# Patient Record
Sex: Female | Born: 1985 | Race: White | Hispanic: No | Marital: Married | Smoking: Current every day smoker
Health system: Southern US, Community
[De-identification: ages and names within clinical notes are randomized; demographics above are authoritative.]

## PROBLEM LIST (undated history)

## (undated) ENCOUNTER — Emergency Department (HOSPITAL_COMMUNITY): Admission: EM | Payer: Medicare Other | Source: Home / Self Care

## (undated) DIAGNOSIS — Z8742 Personal history of other diseases of the female genital tract: Secondary | ICD-10-CM

## (undated) DIAGNOSIS — E119 Type 2 diabetes mellitus without complications: Secondary | ICD-10-CM

## (undated) DIAGNOSIS — F17200 Nicotine dependence, unspecified, uncomplicated: Secondary | ICD-10-CM

## (undated) DIAGNOSIS — F319 Bipolar disorder, unspecified: Secondary | ICD-10-CM

## (undated) DIAGNOSIS — F209 Schizophrenia, unspecified: Secondary | ICD-10-CM

## (undated) DIAGNOSIS — F419 Anxiety disorder, unspecified: Secondary | ICD-10-CM

## (undated) DIAGNOSIS — R1310 Dysphagia, unspecified: Secondary | ICD-10-CM

## (undated) DIAGNOSIS — F329 Major depressive disorder, single episode, unspecified: Secondary | ICD-10-CM

## (undated) DIAGNOSIS — E669 Obesity, unspecified: Secondary | ICD-10-CM

## (undated) DIAGNOSIS — Z8619 Personal history of other infectious and parasitic diseases: Secondary | ICD-10-CM

## (undated) DIAGNOSIS — E282 Polycystic ovarian syndrome: Secondary | ICD-10-CM

## (undated) DIAGNOSIS — R0981 Nasal congestion: Secondary | ICD-10-CM

## (undated) DIAGNOSIS — R000 Tachycardia, unspecified: Secondary | ICD-10-CM

## (undated) DIAGNOSIS — L732 Hidradenitis suppurativa: Secondary | ICD-10-CM

## (undated) DIAGNOSIS — K219 Gastro-esophageal reflux disease without esophagitis: Secondary | ICD-10-CM

## (undated) DIAGNOSIS — F4481 Dissociative identity disorder: Secondary | ICD-10-CM

## (undated) DIAGNOSIS — J45909 Unspecified asthma, uncomplicated: Secondary | ICD-10-CM

## (undated) DIAGNOSIS — I10 Essential (primary) hypertension: Secondary | ICD-10-CM

## (undated) DIAGNOSIS — G4730 Sleep apnea, unspecified: Secondary | ICD-10-CM

## (undated) DIAGNOSIS — M1990 Unspecified osteoarthritis, unspecified site: Secondary | ICD-10-CM

## (undated) HISTORY — DX: Nicotine dependence, unspecified, uncomplicated: F17.200

## (undated) HISTORY — DX: Essential (primary) hypertension: I10

## (undated) HISTORY — PX: WISDOM TOOTH EXTRACTION: SHX21

## (undated) HISTORY — DX: Polycystic ovarian syndrome: E28.2

## (undated) HISTORY — DX: Major depressive disorder, single episode, unspecified: F32.9

## (undated) HISTORY — DX: Personal history of other infectious and parasitic diseases: Z86.19

## (undated) HISTORY — DX: Personal history of other diseases of the female genital tract: Z87.42

## (undated) HISTORY — DX: Obesity, unspecified: E66.9

---
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
## 2007-05-30 ENCOUNTER — Emergency Department (HOSPITAL_COMMUNITY): Admission: EM | Admit: 2007-05-30 | Discharge: 2007-05-30 | Payer: Self-pay | Admitting: Emergency Medicine

## 2007-10-19 ENCOUNTER — Emergency Department (HOSPITAL_COMMUNITY): Admission: EM | Admit: 2007-10-19 | Discharge: 2007-10-19 | Payer: Self-pay | Admitting: Emergency Medicine

## 2009-01-07 ENCOUNTER — Emergency Department (HOSPITAL_COMMUNITY): Admission: EM | Admit: 2009-01-07 | Discharge: 2009-01-07 | Payer: Self-pay | Admitting: Emergency Medicine

## 2009-08-01 ENCOUNTER — Emergency Department (HOSPITAL_COMMUNITY): Admission: EM | Admit: 2009-08-01 | Discharge: 2009-08-01 | Payer: Self-pay | Admitting: Family Medicine

## 2009-12-31 ENCOUNTER — Emergency Department (HOSPITAL_COMMUNITY): Admission: EM | Admit: 2009-12-31 | Discharge: 2009-12-31 | Payer: Self-pay | Admitting: Emergency Medicine

## 2010-05-17 ENCOUNTER — Inpatient Hospital Stay (INDEPENDENT_AMBULATORY_CARE_PROVIDER_SITE_OTHER)
Admission: RE | Admit: 2010-05-17 | Discharge: 2010-05-17 | Disposition: A | Discharge status: Home / Self Care | Payer: No Typology Code available for payment source | Source: Ambulatory Visit | Attending: Emergency Medicine | Admitting: Emergency Medicine

## 2010-05-17 DIAGNOSIS — M25519 Pain in unspecified shoulder: Secondary | ICD-10-CM

## 2010-06-15 LAB — BASIC METABOLIC PANEL
BUN: 12 mg/dL (ref 6–23)
CO2: 22 mEq/L (ref 19–32)
Calcium: 9.5 mg/dL (ref 8.4–10.5)
Chloride: 103 mEq/L (ref 96–112)
Creatinine, Ser: 0.68 mg/dL (ref 0.4–1.2)
GFR calc Af Amer: >60 mL/min (ref >60)
GFR calc non Af Amer: >60 mL/min (ref >60)
Glucose, Bld: 112 mg/dL — ABNORMAL HIGH (ref 70–99)
Potassium: 3.9 mEq/L (ref 3.5–5.1)
Sodium: 136 mEq/L (ref 135–145)

## 2010-06-15 LAB — RPR: RPR Ser Ql: NONREACTIVE

## 2010-06-15 LAB — URINALYSIS, ROUTINE W REFLEX MICROSCOPIC
Bilirubin Urine: NEGATIVE
Glucose, UA: NEGATIVE mg/dL
Hgb urine dipstick: NEGATIVE
Ketones, ur: NEGATIVE mg/dL
Nitrite: NEGATIVE
Protein, ur: NEGATIVE mg/dL
Specific Gravity, Urine: 1.015 (ref 1.005–1.030)
Urobilinogen, UA: 1 mg/dL (ref 0.0–1.0)
pH: 7 (ref 5.0–8.0)

## 2010-06-15 LAB — URINE MICROSCOPIC-ADD ON

## 2010-06-15 LAB — DIFFERENTIAL
Basophils Absolute: 0.1 10*3/uL (ref 0.0–0.1)
Basophils Relative: 1 % (ref 0–1)
Eosinophils Absolute: 0.3 10*3/uL (ref 0.0–0.7)
Eosinophils Relative: 3 % (ref 0–5)
Lymphocytes Relative: 32 % (ref 12–46)
Lymphs Abs: 3.9 10*3/uL (ref 0.7–4.0)
Monocytes Absolute: 0.5 10*3/uL (ref 0.1–1.0)
Monocytes Relative: 4 % (ref 3–12)
Neutro Abs: 7.4 10*3/uL (ref 1.7–7.7)
Neutrophils Relative %: 61 % (ref 43–77)

## 2010-06-15 LAB — CBC
HCT: 43.1 % (ref 36.0–46.0)
Hemoglobin: 15.4 g/dL — ABNORMAL HIGH (ref 12.0–15.0)
MCHC: 35.8 g/dL (ref 30.0–36.0)
MCV: 87.7 fL (ref 78.0–100.0)
Platelets: 332 10*3/uL (ref 150–400)
RBC: 4.91 MIL/uL (ref 3.87–5.11)
RDW: 12.9 % (ref 11.5–15.5)
WBC: 12.1 10*3/uL — ABNORMAL HIGH (ref 4.0–10.5)

## 2010-06-15 LAB — WET PREP, GENITAL: Yeast Wet Prep HPF POC: NONE SEEN

## 2010-06-15 LAB — GC/CHLAMYDIA PROBE AMP, GENITAL
Chlamydia, DNA Probe: NEGATIVE
GC Probe Amp, Genital: NEGATIVE

## 2010-06-15 LAB — POCT PREGNANCY, URINE: Preg Test, Ur: NEGATIVE

## 2010-07-28 NOTE — Op Note (Signed)
[place]  Patient:    Rebecca Moyer, Rebecca Moyer                         MRN: [id number] Proc. Date: 06/26/00 Adm. Date:  [id number] Attending:  Aundria Mems                           Operative Report  PREOPERATIVE DIAGNOSIS:  Hyperplastic obstructive tonsils and adenoids.  POSTOPERATIVE DIAGNOSIS:  Hyperplastic obstructive tonsils and adenoids.  OPERATION PERFORMED:  Adenotonsillectomy.  SURGEON:  Kathy Breach, M.D.  ANESTHESIA:  General orotracheal.  DESCRIPTION OF PROCEDURE:  With the patient under general orotracheal anesthesia, the Crowe-Davis mouth gag was inserted and the patient  put in a Rose position.  Inspection of the oral cavity revealed normal-appearing soft palate.  The hard palate was intact to palpation.  The tonsils were 4+ enlarged, massive in size.  They were nonpulsatile to palpation.  A red rubber catheter was passed through the left nasal chamber and used to elevate the soft palate.  Mirror visualization revealed complete obstruction of the posterior choanae by adenoid tissue.  Adenoids removed by curettage and packs were placed for hemostasis.  The left tonsil was grasped at the superior pole and removed by electrical dissection maintaining complete hemostasis with electrocautery.  The right tonsil was removed in similar fashion.  Packs were removed from the nasopharynx and under mirror visualization with suction cautery, complete ablation of the remaining adenoid tissue along the lateral gutters and Rosenmullers fossa and as it extended into to the roof of the posterior choanae was completed.  The patient had hyperplastic lymphoid tissue, enlarging both eustachian tube orifice and tori.  Estimated blood loss for this procedure was between 50 and 100 cc.  The patient tolerated the procedure well and was taken to the recovery room in stable general condition. DD:  06/26/00 TD:  06/26/00 Job: [id number]

## 2010-09-26 ENCOUNTER — Inpatient Hospital Stay (INDEPENDENT_AMBULATORY_CARE_PROVIDER_SITE_OTHER)
Admission: RE | Admit: 2010-09-26 | Discharge: 2010-09-26 | Disposition: A | Discharge status: Home / Self Care | Payer: Medicare Other | Source: Ambulatory Visit | Attending: Emergency Medicine | Admitting: Emergency Medicine

## 2010-09-26 DIAGNOSIS — L42 Pityriasis rosea: Secondary | ICD-10-CM

## 2010-10-03 ENCOUNTER — Emergency Department (HOSPITAL_COMMUNITY)
Admission: EM | Admit: 2010-10-03 | Discharge: 2010-10-04 | Disposition: A | Discharge status: Home / Self Care | Payer: Medicare Other | Attending: Emergency Medicine | Admitting: Emergency Medicine

## 2010-10-03 DIAGNOSIS — K2990 Gastroduodenitis, unspecified, without bleeding: Secondary | ICD-10-CM | POA: Insufficient documentation

## 2010-10-03 DIAGNOSIS — F319 Bipolar disorder, unspecified: Secondary | ICD-10-CM | POA: Insufficient documentation

## 2010-10-03 DIAGNOSIS — I10 Essential (primary) hypertension: Secondary | ICD-10-CM | POA: Insufficient documentation

## 2010-10-03 DIAGNOSIS — R1013 Epigastric pain: Secondary | ICD-10-CM | POA: Insufficient documentation

## 2010-10-03 DIAGNOSIS — K2970 Gastritis, unspecified, without bleeding: Secondary | ICD-10-CM | POA: Insufficient documentation

## 2010-10-03 DIAGNOSIS — R0789 Other chest pain: Secondary | ICD-10-CM | POA: Insufficient documentation

## 2010-10-03 DIAGNOSIS — J45909 Unspecified asthma, uncomplicated: Secondary | ICD-10-CM | POA: Insufficient documentation

## 2010-10-03 DIAGNOSIS — Z79899 Other long term (current) drug therapy: Secondary | ICD-10-CM | POA: Insufficient documentation

## 2010-10-03 LAB — URINALYSIS, ROUTINE W REFLEX MICROSCOPIC
Bilirubin Urine: NEGATIVE
Ketones, ur: NEGATIVE mg/dL
Leukocytes, UA: NEGATIVE
Nitrite: NEGATIVE
Protein, ur: NEGATIVE mg/dL
Specific Gravity, Urine: 1.011 (ref 1.005–1.030)
Urobilinogen, UA: 0.2 mg/dL (ref 0.0–1.0)
pH: 6.5 (ref 5.0–8.0)

## 2010-10-03 LAB — POCT PREGNANCY, URINE: Preg Test, Ur: NEGATIVE

## 2010-10-03 LAB — URINE MICROSCOPIC-ADD ON

## 2010-10-04 LAB — COMPREHENSIVE METABOLIC PANEL
ALT: 52 U/L — ABNORMAL HIGH (ref 0–35)
AST: 36 U/L (ref 0–37)
Albumin: 4.1 g/dL (ref 3.5–5.2)
Alkaline Phosphatase: 53 U/L (ref 39–117)
BUN: 7 mg/dL (ref 6–23)
Calcium: 10 mg/dL (ref 8.4–10.5)
Chloride: 100 mEq/L (ref 96–112)
GFR calc Af Amer: >60 mL/min (ref >60)
GFR calc non Af Amer: >60 mL/min (ref >60)
Potassium: 4.1 mEq/L (ref 3.5–5.1)
Sodium: 136 mEq/L (ref 135–145)
Total Protein: 7.4 g/dL (ref 6.0–8.3)

## 2010-10-04 LAB — DIFFERENTIAL
Basophils Relative: 1 % (ref 0–1)
Eosinophils Absolute: 0.3 10*3/uL (ref 0.0–0.7)
Eosinophils Relative: 3 % (ref 0–5)
Lymphs Abs: 3.2 10*3/uL (ref 0.7–4.0)
Monocytes Absolute: 0.4 10*3/uL (ref 0.1–1.0)
Monocytes Relative: 4 % (ref 3–12)
Neutro Abs: 6 10*3/uL (ref 1.7–7.7)
Neutrophils Relative %: 60 % (ref 43–77)

## 2010-10-04 LAB — CBC
HCT: 42.2 % (ref 36.0–46.0)
Hemoglobin: 15.6 g/dL — ABNORMAL HIGH (ref 12.0–15.0)
MCH: 31.1 pg (ref 26.0–34.0)
MCHC: 37 g/dL — ABNORMAL HIGH (ref 30.0–36.0)
MCV: 84.1 fL (ref 78.0–100.0)
Platelets: 288 10*3/uL (ref 150–400)
RBC: 5.02 MIL/uL (ref 3.87–5.11)
RDW: 12.6 % (ref 11.5–15.5)

## 2010-12-22 LAB — POCT RAPID STREP A: Streptococcus, Group A Screen (Direct): NEGATIVE

## 2011-06-07 ENCOUNTER — Encounter (INDEPENDENT_AMBULATORY_CARE_PROVIDER_SITE_OTHER): Payer: Medicare Other | Admitting: Obstetrics and Gynecology

## 2011-06-07 DIAGNOSIS — Z124 Encounter for screening for malignant neoplasm of cervix: Secondary | ICD-10-CM

## 2011-06-07 DIAGNOSIS — E282 Polycystic ovarian syndrome: Secondary | ICD-10-CM

## 2011-06-07 DIAGNOSIS — F32A Depression, unspecified: Secondary | ICD-10-CM

## 2011-06-07 DIAGNOSIS — N926 Irregular menstruation, unspecified: Secondary | ICD-10-CM

## 2011-06-07 DIAGNOSIS — E669 Obesity, unspecified: Secondary | ICD-10-CM

## 2011-06-07 DIAGNOSIS — F17200 Nicotine dependence, unspecified, uncomplicated: Secondary | ICD-10-CM

## 2011-06-07 DIAGNOSIS — Z202 Contact with and (suspected) exposure to infections with a predominantly sexual mode of transmission: Secondary | ICD-10-CM

## 2011-06-07 DIAGNOSIS — I10 Essential (primary) hypertension: Secondary | ICD-10-CM

## 2011-06-07 HISTORY — DX: Depression, unspecified: F32.A

## 2011-06-07 HISTORY — PX: TONSILLECTOMY: SUR1361

## 2011-06-07 HISTORY — DX: Essential (primary) hypertension: I10

## 2011-06-07 HISTORY — DX: Obesity, unspecified: E66.9

## 2011-06-07 HISTORY — PX: OTHER SURGICAL HISTORY: SHX169

## 2011-06-07 HISTORY — DX: Nicotine dependence, unspecified, uncomplicated: F17.200

## 2011-06-07 HISTORY — PX: WISDOM TOOTH EXTRACTION: SHX21

## 2011-06-07 HISTORY — DX: Polycystic ovarian syndrome: E28.2

## 2011-06-19 ENCOUNTER — Other Ambulatory Visit: Payer: Self-pay | Admitting: Obstetrics and Gynecology

## 2011-06-19 ENCOUNTER — Ambulatory Visit (INDEPENDENT_AMBULATORY_CARE_PROVIDER_SITE_OTHER): Payer: Medicare Other | Admitting: Obstetrics and Gynecology

## 2011-06-19 ENCOUNTER — Encounter: Payer: Self-pay | Admitting: Obstetrics and Gynecology

## 2011-06-19 VITALS — BP 140/74 | Resp 16 | Ht 70.0 in | Wt 288.0 lb

## 2011-06-19 DIAGNOSIS — D280 Benign neoplasm of vulva: Secondary | ICD-10-CM

## 2011-06-19 NOTE — Progress Notes (Signed)
Patient ID: Rebecca Moyer, female   DOB: 1985/10/13, 26 y.o.   MRN: [id number]  Chief Complaint  Patient presents with  . Follow-up    f/u from 06-07-11 Visit    HPI Rebecca Moyer is a 26 y.o. female.  She has a lesion on her vulva that may be a condylomata or a skin tag. It irritates her. 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 No pertinent past medical history.  History reviewed. No pertinent past surgical history.  History reviewed. No pertinent family history.  Social History History  Substance Use Topics  . Smoking status: Current Everyday Smoker -- 0.5 packs/day for 3 years    Types: Cigarettes  . Smokeless tobacco: Not on file   Comment: pt states she is try to quite as of now  . Alcohol Use: Not on file    No Known Allergies  Current Outpatient Prescriptions  Medication Sig Dispense Refill  . ALPRAZolam (XANAX PO) Take by mouth.      Marland Kitchen atenolol (TENORMIN) 100 MG tablet Take by mouth daily.      Marland Kitchen HYDROCODONE-ACETAMINOPHEN PO Take by mouth.        Review of Systems Review of Systems  Blood pressure 140/74, resp. rate 16, height 5\' 10"  (1.778 m), weight 288 lb (130.636 kg), last menstrual period 06/19/2011.  Physical Exam Physical Exam  Data Reviewed Pap WNL, GC neg, Chl neg  Assessment    Prepping with Betadine Local anesthesia with 1% Buffered Lidocaine Stitch of 4-0 Vicryl placed at the base of the lesion. Lesion (0.5 cm) sharply excised. Tolerated well. Silver Nitrate applied:  No  Well tolerated  Specimen appropriately identified and sent to pathology    Plan    Follow-up:  3 weeks      Newel Oien V 06/19/2011, 9:06 PM

## 2011-06-24 ENCOUNTER — Ambulatory Visit (HOSPITAL_BASED_OUTPATIENT_CLINIC_OR_DEPARTMENT_OTHER): Payer: Medicaid Other

## 2011-07-10 ENCOUNTER — Encounter: Payer: Medicaid Other | Admitting: Obstetrics and Gynecology

## 2011-07-11 ENCOUNTER — Telehealth: Payer: Self-pay | Admitting: Obstetrics and Gynecology

## 2011-08-27 ENCOUNTER — Telehealth: Payer: Self-pay | Admitting: Obstetrics and Gynecology

## 2011-08-27 NOTE — Telephone Encounter (Signed)
Triage/cht received 

## 2011-08-27 NOTE — Telephone Encounter (Signed)
Tried to return pt call at [phone] but phone number did not work.

## 2011-09-08 ENCOUNTER — Encounter (HOSPITAL_COMMUNITY): Payer: Self-pay | Admitting: *Deleted

## 2011-09-08 ENCOUNTER — Emergency Department (HOSPITAL_COMMUNITY): Payer: Medicare Other

## 2011-09-08 ENCOUNTER — Emergency Department (HOSPITAL_COMMUNITY)
Admission: EM | Admit: 2011-09-08 | Discharge: 2011-09-08 | Disposition: A | Discharge status: Home / Self Care | Payer: Medicare Other | Attending: Emergency Medicine | Admitting: Emergency Medicine

## 2011-09-08 DIAGNOSIS — J45909 Unspecified asthma, uncomplicated: Secondary | ICD-10-CM | POA: Insufficient documentation

## 2011-09-08 DIAGNOSIS — Z79899 Other long term (current) drug therapy: Secondary | ICD-10-CM | POA: Insufficient documentation

## 2011-09-08 DIAGNOSIS — M25562 Pain in left knee: Secondary | ICD-10-CM

## 2011-09-08 DIAGNOSIS — I10 Essential (primary) hypertension: Secondary | ICD-10-CM | POA: Insufficient documentation

## 2011-09-08 DIAGNOSIS — E669 Obesity, unspecified: Secondary | ICD-10-CM | POA: Insufficient documentation

## 2011-09-08 DIAGNOSIS — F17200 Nicotine dependence, unspecified, uncomplicated: Secondary | ICD-10-CM | POA: Insufficient documentation

## 2011-09-08 DIAGNOSIS — G8929 Other chronic pain: Secondary | ICD-10-CM | POA: Insufficient documentation

## 2011-09-08 DIAGNOSIS — M25569 Pain in unspecified knee: Secondary | ICD-10-CM | POA: Insufficient documentation

## 2011-09-08 MED ORDER — OXYCODONE-ACETAMINOPHEN 5-325 MG PO TABS
2.0000 | ORAL_TABLET | Freq: Once | ORAL | Status: AC
  Administered 2011-09-08: 2 via ORAL
  Filled 2011-09-08: qty 2

## 2011-09-08 MED ORDER — OXYCODONE-ACETAMINOPHEN 5-325 MG PO TABS: 1.0000 | ORAL_TABLET | Freq: Four times a day (QID) | ORAL | Status: AC | PRN

## 2011-09-08 NOTE — ED Provider Notes (Signed)
Medical screening examination/treatment/procedure(s) were performed by non-physician practitioner and as supervising physician I was immediately available for consultation/collaboration.   Zacaria Pousson, MD 09/08/11 0706 

## 2011-09-08 NOTE — ED Provider Notes (Signed)
History     CSN: [id number]  Arrival date & time 09/08/11  Rich Fuchs   First MD Initiated Contact with Patient 09/08/11 0141      Chief Complaint  Patient presents with  . Knee Pain    L    (Consider location/radiation/quality/duration/timing/severity/associated sxs/prior treatment) HPI Comments: Patient with a history of chronic knee pain, treated by Dr. Mayford Knife with Vicodin.  No states, that her left knee.  Pain is worse, and she can barely stand to walk for the past 3, days  Patient is a 26 y.o. female presenting with knee pain.  Knee Pain This is a chronic problem. The current episode started more than 1 year ago. The problem has been gradually worsening. Associated symptoms include joint swelling. Pertinent negatives include no chills, fever, rash or weakness.    Past Medical History  Diagnosis Date  . Asthma 06/07/11  . H/O varicella   . H/O amenorrhea 01/27/04  . Irregular periods/menstrual cycles 05/22/04  . Depression 06/07/11  . Hypertension 06/07/11  . PCOS (polycystic ovarian syndrome) 06/07/11  . Obesity 06/07/11  . Smoker 06/07/11    Past Surgical History  Procedure Date  . Tonsillectomy 06/07/11  . Wisdom tooth extraction 06/07/11  . Adnoids 06/07/11    No family history on file.  History  Substance Use Topics  . Smoking status: Current Everyday Smoker -- 0.5 packs/day for 3 years    Types: Cigarettes  . Smokeless tobacco: Not on file   Comment: pt states she is try to quite as of now  . Alcohol Use: No    OB History    Grav Para Term Preterm Abortions TAB SAB Ect Mult Living                  Review of Systems  Constitutional: Negative for fever and chills.  Musculoskeletal: Positive for joint swelling.  Skin: Negative for rash and wound.  Neurological: Negative for dizziness and weakness.    Allergies  Triamterene  Home Medications   Current Outpatient Rx  Name Route Sig Dispense Refill  . ALBUTEROL SULFATE HFA 108 (90 BASE) MCG/ACT IN AERS  Inhalation Inhale 2 puffs into the lungs every 6 (six) hours as needed. Wheezing    . ALBUTEROL SULFATE (2.5 MG/3ML) 0.083% IN NEBU Nebulization Take 2.5 mg by nebulization every 8 (eight) hours as needed. Shortness of breath    . ALPRAZOLAM 1 MG PO TABS Oral Take 1 mg by mouth 3 (three) times daily. Scheduled doses for anxiety    . ATENOLOL-CHLORTHALIDONE 50-25 MG PO TABS Oral Take 1 tablet by mouth daily.    Marland Kitchen DILTIAZEM HCL 120 MG PO TABS Oral Take 120 mg by mouth daily.    Marland Kitchen FLUTICASONE-SALMETEROL 500-50 MCG/DOSE IN AEPB Inhalation Inhale 1 puff into the lungs every 12 (twelve) hours.    Marland Kitchen HYDROCODONE-ACETAMINOPHEN 7.5-325 MG PO TABS Oral Take 1 tablet by mouth 2 (two) times daily as needed. pain    . NORETHINDRONE 0.35 MG PO TABS Oral Take 1 tablet by mouth daily.    . OXYCODONE-ACETAMINOPHEN 5-325 MG PO TABS Oral Take 1 tablet by mouth every 6 (six) hours as needed for pain. 20 tablet 0    BP 129/87  Pulse 87  Temp 97.3 F (36.3 C) (Oral)  Resp 18  SpO2 96%  LMP 06/08/2011  Physical Exam  Constitutional: She appears well-developed and well-nourished.       Morbidly obese  HENT:  Head: Normocephalic.  Eyes: Pupils are equal, round,  and reactive to light.  Neck: Normal range of motion.  Cardiovascular: Normal rate.   Pulmonary/Chest: Effort normal.  Musculoskeletal: Normal range of motion.       Legs: Neurological: She is alert.  Skin: Skin is warm.    ED Course  Procedures (including critical care time)  Labs Reviewed - No data to display Dg Knee Complete 4 Views Left  09/08/2011  *RADIOLOGY REPORT*  Clinical Data: Knee pain, left side.  LEFT KNEE - COMPLETE 4+ VIEW  Comparison: 10/18/2008  Findings: No fracture, dislocation, or aggressive osseous lesion. No significant joint effusion.  IMPRESSION: No acute osseous abnormality identified.  Original Report Authenticated By: Waneta Martins, M.D.     1. Knee pain, left       MDM   Exacerbation of chronic knee  pain, not controlled with 7.5 mg, Norco        Arman Filter, NP 09/08/11 1610

## 2011-09-08 NOTE — Discharge Instructions (Signed)
Knee Pain The knee is the complex joint between your thigh and your lower leg. It is made up of bones, tendons, ligaments, and cartilage. The bones that make up the knee are:  The femur in the thigh.   The tibia and fibula in the lower leg.   The patella or kneecap riding in the groove on the lower femur.  CAUSES  Knee pain is a common complaint with many causes. A few of these causes are:  Injury, such as:   A ruptured ligament or tendon injury.   Torn cartilage.   Medical conditions, such as:   Gout   Arthritis   Infections   Overuse, over training or overdoing a physical activity.  Knee pain can be minor or severe. Knee pain can accompany debilitating injury. Minor knee problems often respond well to self-care measures or get well on their own. More serious injuries may need medical intervention or even surgery. SYMPTOMS The knee is complex. Symptoms of knee problems can vary widely. Some of the problems are:  Pain with movement and weight bearing.   Swelling and tenderness.   Buckling of the knee.   Inability to straighten or extend your knee.   Your knee locks and you cannot straighten it.   Warmth and redness with pain and fever.   Deformity or dislocation of the kneecap.  DIAGNOSIS  Determining what is wrong may be very straight forward such as when there is an injury. It can also be challenging because of the complexity of the knee. Tests to make a diagnosis may include:  Your caregiver taking a history and doing a physical exam.   Routine X-rays can be used to rule out other problems. X-rays will not reveal a cartilage tear. Some injuries of the knee can be diagnosed by:   Arthroscopy a surgical technique by which a small video camera is inserted through tiny incisions on the sides of the knee. This procedure is used to examine and repair internal knee joint problems. Tiny instruments can be used during arthroscopy to repair the torn knee cartilage  (meniscus).   Arthrography is a radiology technique. A contrast liquid is directly injected into the knee joint. Internal structures of the knee joint then become visible on X-ray film.   An MRI scan is a non x-ray radiology procedure in which magnetic fields and a computer produce two- or three-dimensional images of the inside of the knee. Cartilage tears are often visible using an MRI scanner. MRI scans have largely replaced arthrography in diagnosing cartilage tears of the knee.   Blood work.   Examination of the fluid that helps to lubricate the knee joint (synovial fluid). This is done by taking a sample out using a needle and a syringe.  TREATMENT The treatment of knee problems depends on the cause. Some of these treatments are:  Depending on the injury, proper casting, splinting, surgery or physical therapy care will be needed.   Give yourself adequate recovery time. Do not overuse your joints. If you begin to get sore during workout routines, back off. Slow down or do fewer repetitions.   For repetitive activities such as cycling or running, maintain your strength and nutrition.   Alternate muscle groups. For example if you are a weight lifter, work the upper body on one day and the lower body the next.   Either tight or weak muscles do not give the proper support for your knee. Tight or weak muscles do not absorb the stress placed   on the knee joint. Keep the muscles surrounding the knee strong.   Take care of mechanical problems.   If you have flat feet, orthotics or special shoes may help. See your caregiver if you need help.   Arch supports, sometimes with wedges on the inner or outer aspect of the heel, can help. These can shift pressure away from the side of the knee most bothered by osteoarthritis.   A brace called an "unloader" brace also may be used to help ease the pressure on the most arthritic side of the knee.   If your caregiver has prescribed crutches, braces,  wraps or ice, use as directed. The acronym for this is PRICE. This means protection, rest, ice, compression and elevation.   Nonsteroidal anti-inflammatory drugs (NSAID's), can help relieve pain. But if taken immediately after an injury, they may actually increase swelling. Take NSAID's with food in your stomach. Stop them if you develop stomach problems. Do not take these if you have a history of ulcers, stomach pain or bleeding from the bowel. Do not take without your caregiver's approval if you have problems with fluid retention, heart failure, or kidney problems.   For ongoing knee problems, physical therapy may be helpful.   Glucosamine and chondroitin are over-the-counter dietary supplements. Both may help relieve the pain of osteoarthritis in the knee. These medicines are different from the usual anti-inflammatory drugs. Glucosamine may decrease the rate of cartilage destruction.   Injections of a corticosteroid drug into your knee joint may help reduce the symptoms of an arthritis flare-up. They may provide pain relief that lasts a few months. You may have to wait a few months between injections. The injections do have a small increased risk of infection, water retention and elevated blood sugar levels.   Hyaluronic acid injected into damaged joints may ease pain and provide lubrication. These injections may work by reducing inflammation. A series of shots may give relief for as long as 6 months.   Topical painkillers. Applying certain ointments to your skin may help relieve the pain and stiffness of osteoarthritis. Ask your pharmacist for suggestions. Many over the-counter products are approved for temporary relief of arthritis pain.   In some countries, doctors often prescribe topical NSAID's for relief of chronic conditions such as arthritis and tendinitis. A review of treatment with NSAID creams found that they worked as well as oral medications but without the serious side effects.    PREVENTION  Maintain a healthy weight. Extra pounds put more strain on your joints.   Get strong, stay limber. Weak muscles are a common cause of knee injuries. Stretching is important. Include flexibility exercises in your workouts.   Be smart about exercise. If you have osteoarthritis, chronic knee pain or recurring injuries, you may need to change the way you exercise. This does not mean you have to stop being active. If your knees ache after jogging or playing basketball, consider switching to swimming, water aerobics or other low-impact activities, at least for a few days a week. Sometimes limiting high-impact activities will provide relief.   Make sure your shoes fit well. Choose footwear that is right for your sport.   Protect your knees. Use the proper gear for knee-sensitive activities. Use kneepads when playing volleyball or laying carpet. Buckle your seat belt every time you drive. Most shattered kneecaps occur in car accidents.   Rest when you are tired.  SEEK MEDICAL CARE IF:  You have knee pain that is continual and does not   seem to be getting better.  SEEK IMMEDIATE MEDICAL CARE IF:  Your knee joint feels hot to the touch and you have a high fever. MAKE SURE YOU:   Understand these instructions.   Will watch your condition.   Will get help right away if you are not doing well or get worse.  Document Released: 12/24/2006 Document Revised: 02/15/2011 Document Reviewed: 12/24/2006 [facility] Patient Information 2012 [place], [place]. As make an appointment with Dr. Luiz Blare for further evaluation of her chronic knee pain

## 2011-09-08 NOTE — ED Notes (Signed)
Ortho tech here and left knee sleeve applied

## 2011-09-08 NOTE — ED Notes (Signed)
C/o chronic knee problems, this episode onset Thursday night, c/o sharp L knee pain, worse with movement, walking and standing, desribes "noisy and popping".

## 2011-09-24 ENCOUNTER — Encounter: Payer: Medicaid Other | Admitting: Obstetrics and Gynecology

## 2011-11-01 ENCOUNTER — Emergency Department (HOSPITAL_COMMUNITY)
Admission: EM | Admit: 2011-11-01 | Discharge: 2011-11-01 | Disposition: A | Discharge status: Home / Self Care | Payer: Medicare Other | Attending: Emergency Medicine | Admitting: Emergency Medicine

## 2011-11-01 ENCOUNTER — Encounter (HOSPITAL_COMMUNITY): Payer: Self-pay | Admitting: Emergency Medicine

## 2011-11-01 DIAGNOSIS — M545 Low back pain: Secondary | ICD-10-CM

## 2011-11-01 DIAGNOSIS — I10 Essential (primary) hypertension: Secondary | ICD-10-CM | POA: Insufficient documentation

## 2011-11-01 DIAGNOSIS — E669 Obesity, unspecified: Secondary | ICD-10-CM | POA: Insufficient documentation

## 2011-11-01 DIAGNOSIS — M549 Dorsalgia, unspecified: Secondary | ICD-10-CM | POA: Insufficient documentation

## 2011-11-01 DIAGNOSIS — F17200 Nicotine dependence, unspecified, uncomplicated: Secondary | ICD-10-CM | POA: Insufficient documentation

## 2011-11-01 DIAGNOSIS — G8929 Other chronic pain: Secondary | ICD-10-CM | POA: Insufficient documentation

## 2011-11-01 DIAGNOSIS — J45909 Unspecified asthma, uncomplicated: Secondary | ICD-10-CM | POA: Insufficient documentation

## 2011-11-01 LAB — URINALYSIS, ROUTINE W REFLEX MICROSCOPIC
Bilirubin Urine: NEGATIVE
Glucose, UA: 250 mg/dL — AB
Ketones, ur: 15 mg/dL — AB
Nitrite: NEGATIVE
Protein, ur: NEGATIVE mg/dL
Specific Gravity, Urine: >1.03 — ABNORMAL HIGH (ref 1.005–1.030)
Urobilinogen, UA: 0.2 mg/dL (ref 0.0–1.0)
pH: 6 (ref 5.0–8.0)

## 2011-11-01 LAB — URINE MICROSCOPIC-ADD ON

## 2011-11-01 MED ORDER — HYDROCODONE-ACETAMINOPHEN 5-325 MG PO TABS: 1.0000 | ORAL_TABLET | Freq: Four times a day (QID) | ORAL | Status: AC | PRN

## 2011-11-01 MED ORDER — OXYCODONE-ACETAMINOPHEN 5-325 MG PO TABS
2.0000 | ORAL_TABLET | Freq: Once | ORAL | Status: AC
  Administered 2011-11-01: 2 via ORAL
  Filled 2011-11-01: qty 2

## 2011-11-01 MED ORDER — KETOROLAC TROMETHAMINE 60 MG/2ML IM SOLN: 60.0000 mg | Freq: Once | INTRAMUSCULAR | Status: DC

## 2011-11-01 MED ORDER — CYCLOBENZAPRINE HCL 10 MG PO TABS: 10.0000 mg | ORAL_TABLET | Freq: Three times a day (TID) | ORAL | Status: AC | PRN

## 2011-11-01 NOTE — ED Notes (Signed)
Pt for discharge.Vital signs stable and GCS 15 

## 2011-11-01 NOTE — ED Provider Notes (Signed)
History     CSN: [id number]  Arrival date & time 11/01/11  0520   First MD Initiated Contact with Patient 11/01/11 [phone]      Chief Complaint  Patient presents with  . Back Pain    (Consider location/radiation/quality/duration/timing/severity/associated sxs/prior treatment) HPI Patient is here for chronic back pain. The patient states that her pain got worse 3 days ago. She also states that she is dizzy. The patient denies numbness, weakness, nausea, vomiting, chest pain, SOB, headache, fever, syncope, or blurred vision.The patient states that she tried ibuprofen. The patient denies any relief. The patient states that she only gets relief with narcotic pain medications. The patein Past Medical History  Diagnosis Date  . Asthma 06/07/11  . H/O varicella   . H/O amenorrhea 01/27/04  . Irregular periods/menstrual cycles 05/22/04  . Depression 06/07/11  . Hypertension 06/07/11  . PCOS (polycystic ovarian syndrome) 06/07/11  . Obesity 06/07/11  . Smoker 06/07/11    Past Surgical History  Procedure Date  . Tonsillectomy 06/07/11  . Wisdom tooth extraction 06/07/11  . Adnoids 06/07/11    No family history on file.  History  Substance Use Topics  . Smoking status: Current Everyday Smoker -- 0.5 packs/day for 3 years    Types: Cigarettes  . Smokeless tobacco: Not on file   Comment: pt states she is try to quite as of now  . Alcohol Use: No    OB History    Grav Para Term Preterm Abortions TAB SAB Ect Mult Living                  Review of Systems All other systems negative except as documented in the HPI. All pertinent positives and negatives as reviewed in the HPI.  Allergies  Triamterene  Home Medications   Current Outpatient Rx  Name Route Sig Dispense Refill  . ALBUTEROL SULFATE HFA 108 (90 BASE) MCG/ACT IN AERS Inhalation Inhale 2 puffs into the lungs every 6 (six) hours as needed. Wheezing    . ALBUTEROL SULFATE (2.5 MG/3ML) 0.083% IN NEBU Nebulization Take 2.5 mg  by nebulization every 8 (eight) hours as needed. Shortness of breath    . ALPRAZOLAM 1 MG PO TABS Oral Take 1 mg by mouth 3 (three) times daily. Scheduled doses for anxiety    . ATENOLOL-CHLORTHALIDONE 50-25 MG PO TABS Oral Take 1 tablet by mouth daily.    Marland Kitchen DILTIAZEM HCL 120 MG PO TABS Oral Take 120 mg by mouth daily.    Marland Kitchen FLUTICASONE-SALMETEROL 500-50 MCG/DOSE IN AEPB Inhalation Inhale 1 puff into the lungs every 12 (twelve) hours.    Marland Kitchen HYDROCODONE-ACETAMINOPHEN 7.5-325 MG PO TABS Oral Take 1 tablet by mouth 2 (two) times daily as needed. pain    . IBUPROFEN 200 MG PO TABS Oral Take 400 mg by mouth every 6 (six) hours as needed.      BP 112/56  Pulse 102  Temp 97.1 F (36.2 C) (Oral)  Resp 23  SpO2 96%  Physical Exam  Constitutional: She is oriented to person, place, and time. She appears well-developed and well-nourished. No distress.  HENT:  Head: Normocephalic and atraumatic.  Mouth/Throat: Oropharynx is clear and moist.  Cardiovascular: Normal rate, regular rhythm and normal heart sounds.  Exam reveals no gallop and no friction rub.   No murmur heard. Pulmonary/Chest: Effort normal and breath sounds normal.  Musculoskeletal:       Left shoulder: She exhibits tenderness and pain. She exhibits normal range of motion, no  bony tenderness, no deformity and no spasm.       Cervical back: She exhibits tenderness, pain and spasm. She exhibits normal range of motion and no bony tenderness.       Thoracic back: Normal.       Lumbar back: Normal.       Back:       Arms: Neurological: She is alert and oriented to person, place, and time. She exhibits normal muscle tone. Coordination normal.  Skin: Skin is warm and dry. No rash noted.    ED Course  Procedures (including critical care time)   Labs Reviewed  POCT PREGNANCY, URINE  URINALYSIS, ROUTINE W REFLEX MICROSCOPIC    Patient has no neurological deficits noted on exam. Patient referred to her PCP as well. Told to follow up  with ortho as well. Told to use ice and heat. There is no new qualities to her chronic pain.  MDM  MDM Reviewed: previous chart, nursing note and vitals Interpretation: labs and x-ray            Carlyle Dolly, PA-C 11/08/11 1108

## 2011-11-01 NOTE — ED Notes (Signed)
Pt sitting up in bed.Complains of chronic back pain.She refused for blood works and wants to go home with pain medications and her routine meds.

## 2011-11-01 NOTE — ED Notes (Addendum)
Pt ambulated with a  Steady gait and no distress to provide urine sample.

## 2011-11-01 NOTE — ED Notes (Signed)
PT. REPORTS LOW BACK PAIN /LEFT SHOULDER PAIN WITH DIZZINESS ONSET YESTERDAY . PT. STATES SHE LOST HER BALANCE AND FELL YESTERDAY WHILE HELPING SOMEBODY MOVE  A FURNITURE.

## 2011-11-10 NOTE — ED Provider Notes (Signed)
Medical screening examination/treatment/procedure(s) were performed by non-physician practitioner and as supervising physician I was immediately available for consultation/collaboration.  Mehak Roskelley R. Jaimeson Gopal, MD 11/10/11 0658 

## 2012-12-28 ENCOUNTER — Emergency Department (HOSPITAL_COMMUNITY): Payer: Medicare Other

## 2012-12-28 ENCOUNTER — Encounter (HOSPITAL_COMMUNITY): Payer: Self-pay | Admitting: Emergency Medicine

## 2012-12-28 ENCOUNTER — Emergency Department (HOSPITAL_COMMUNITY)
Admission: EM | Admit: 2012-12-28 | Discharge: 2012-12-28 | Disposition: A | Discharge status: Home / Self Care | Payer: Medicare Other | Attending: Emergency Medicine | Admitting: Emergency Medicine

## 2012-12-28 DIAGNOSIS — S0990XA Unspecified injury of head, initial encounter: Secondary | ICD-10-CM | POA: Insufficient documentation

## 2012-12-28 DIAGNOSIS — Y9389 Activity, other specified: Secondary | ICD-10-CM | POA: Insufficient documentation

## 2012-12-28 DIAGNOSIS — IMO0002 Reserved for concepts with insufficient information to code with codable children: Secondary | ICD-10-CM | POA: Insufficient documentation

## 2012-12-28 DIAGNOSIS — Y92410 Unspecified street and highway as the place of occurrence of the external cause: Secondary | ICD-10-CM | POA: Insufficient documentation

## 2012-12-28 DIAGNOSIS — S4980XA Other specified injuries of shoulder and upper arm, unspecified arm, initial encounter: Secondary | ICD-10-CM | POA: Insufficient documentation

## 2012-12-28 DIAGNOSIS — S298XXA Other specified injuries of thorax, initial encounter: Secondary | ICD-10-CM | POA: Insufficient documentation

## 2012-12-28 DIAGNOSIS — F3289 Other specified depressive episodes: Secondary | ICD-10-CM | POA: Insufficient documentation

## 2012-12-28 DIAGNOSIS — Z79899 Other long term (current) drug therapy: Secondary | ICD-10-CM | POA: Insufficient documentation

## 2012-12-28 DIAGNOSIS — S0993XA Unspecified injury of face, initial encounter: Secondary | ICD-10-CM | POA: Insufficient documentation

## 2012-12-28 DIAGNOSIS — Z8619 Personal history of other infectious and parasitic diseases: Secondary | ICD-10-CM | POA: Insufficient documentation

## 2012-12-28 DIAGNOSIS — Z8639 Personal history of other endocrine, nutritional and metabolic disease: Secondary | ICD-10-CM | POA: Insufficient documentation

## 2012-12-28 DIAGNOSIS — E669 Obesity, unspecified: Secondary | ICD-10-CM | POA: Insufficient documentation

## 2012-12-28 DIAGNOSIS — Z862 Personal history of diseases of the blood and blood-forming organs and certain disorders involving the immune mechanism: Secondary | ICD-10-CM | POA: Insufficient documentation

## 2012-12-28 DIAGNOSIS — Z8742 Personal history of other diseases of the female genital tract: Secondary | ICD-10-CM | POA: Insufficient documentation

## 2012-12-28 DIAGNOSIS — S46909A Unspecified injury of unspecified muscle, fascia and tendon at shoulder and upper arm level, unspecified arm, initial encounter: Secondary | ICD-10-CM | POA: Insufficient documentation

## 2012-12-28 DIAGNOSIS — F329 Major depressive disorder, single episode, unspecified: Secondary | ICD-10-CM | POA: Insufficient documentation

## 2012-12-28 DIAGNOSIS — I10 Essential (primary) hypertension: Secondary | ICD-10-CM | POA: Insufficient documentation

## 2012-12-28 DIAGNOSIS — J45909 Unspecified asthma, uncomplicated: Secondary | ICD-10-CM | POA: Insufficient documentation

## 2012-12-28 DIAGNOSIS — F17200 Nicotine dependence, unspecified, uncomplicated: Secondary | ICD-10-CM | POA: Insufficient documentation

## 2012-12-28 MED ORDER — METHOCARBAMOL 500 MG PO TABS: 500.0000 mg | ORAL_TABLET | Freq: Two times a day (BID) | ORAL | Status: DC

## 2012-12-28 MED ORDER — HYDROCODONE-ACETAMINOPHEN 5-325 MG PO TABS
2.0000 | ORAL_TABLET | Freq: Once | ORAL | Status: AC
  Administered 2012-12-28: 2 via ORAL
  Filled 2012-12-28: qty 2

## 2012-12-28 MED ORDER — HYDROCODONE-ACETAMINOPHEN 5-325 MG PO TABS: 2.0000 | ORAL_TABLET | Freq: Four times a day (QID) | ORAL | Status: DC | PRN

## 2012-12-28 MED ORDER — NAPROXEN 500 MG PO TABS: 500.0000 mg | ORAL_TABLET | Freq: Two times a day (BID) | ORAL | Status: DC
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
## 2012-12-28 NOTE — ED Notes (Signed)
Patient going upstairs to 6N to visit fiance

## 2012-12-28 NOTE — ED Notes (Signed)
Patient transported to X-ray 
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
## 2013-01-01 NOTE — ED Provider Notes (Signed)
Medical screening examination/treatment/procedure(s) were performed by non-physician practitioner and as supervising physician I was immediately available for consultation/collaboration.  EKG Interpretation   None         Rolan Bucco, MD 01/01/13 0700

## 2013-03-28 ENCOUNTER — Encounter (HOSPITAL_COMMUNITY): Payer: Self-pay | Admitting: Emergency Medicine

## 2013-03-28 ENCOUNTER — Emergency Department (INDEPENDENT_AMBULATORY_CARE_PROVIDER_SITE_OTHER)
Admission: EM | Admit: 2013-03-28 | Discharge: 2013-03-28 | Disposition: A | Discharge status: Home / Self Care | Payer: Medicare Other | Source: Home / Self Care | Attending: Emergency Medicine | Admitting: Emergency Medicine

## 2013-03-28 DIAGNOSIS — H7290 Unspecified perforation of tympanic membrane, unspecified ear: Secondary | ICD-10-CM

## 2013-03-28 DIAGNOSIS — H6690 Otitis media, unspecified, unspecified ear: Secondary | ICD-10-CM

## 2013-03-28 MED ORDER — AMOXICILLIN 500 MG PO CAPS: 500.0000 mg | ORAL_CAPSULE | Freq: Three times a day (TID) | ORAL | Status: DC

## 2013-03-28 NOTE — ED Provider Notes (Signed)
CSN: [id number]     Arrival date & time 03/28/13  1649 History   First MD Initiated Contact with Patient 03/28/13 1901     Chief Complaint  Patient presents with  . Otalgia  . Cough   (Consider location/radiation/quality/duration/timing/severity/associated sxs/prior Treatment) HPI Comments: Approximately one week of mild URI sx with 5 days of left ear pain. No drainage from ear canal but does endorse diminished hearing on left side.   Patient is a 28 y.o. female presenting with ear pain and cough. The history is provided by the patient.  Otalgia Location:  Left Quality:  Aching Severity:  Moderate Onset quality:  Gradual Duration:  5 days Timing:  Constant Chronicity:  New Associated symptoms: congestion, cough and fever   Cough Associated symptoms: ear pain and fever     Past Medical History  Diagnosis Date  . Asthma 06/07/11  . H/O varicella   . H/O amenorrhea 01/27/04  . Irregular periods/menstrual cycles 05/22/04  . Depression 06/07/11  . Hypertension 06/07/11  . PCOS (polycystic ovarian syndrome) 06/07/11  . Obesity 06/07/11  . Smoker 06/07/11   Past Surgical History  Procedure Laterality Date  . Tonsillectomy  06/07/11  . Wisdom tooth extraction  06/07/11  . Adnoids  06/07/11   No family history on file. History  Substance Use Topics  . Smoking status: Current Every Day Smoker -- 0.50 packs/day    Types: Cigarettes  . Smokeless tobacco: Not on file     Comment: pt states she is try to quite as of now  . Alcohol Use: No   OB History   Grav Para Term Preterm Abortions TAB SAB Ect Mult Living                 Review of Systems  Constitutional: Positive for fever.  HENT: Positive for congestion and ear pain.   Respiratory: Positive for cough.   All other systems reviewed and are negative.    Allergies  Triamterene  Home Medications   Current Outpatient Rx  Name  Route  Sig  Dispense  Refill  . albuterol (PROVENTIL HFA;VENTOLIN HFA) 108 (90 BASE) MCG/ACT  inhaler   Inhalation   Inhale 2 puffs into the lungs every 6 (six) hours as needed. Wheezing         . albuterol (PROVENTIL) (2.5 MG/3ML) 0.083% nebulizer solution   Nebulization   Take 2.5 mg by nebulization every 8 (eight) hours as needed. Shortness of breath         . ALPRAZolam (XANAX) 1 MG tablet   Oral   Take 1 mg by mouth 3 (three) times daily. Scheduled doses for anxiety         . ATENOLOL PO   Oral   Take by mouth.         . diltiazem (CARDIZEM) 120 MG tablet   Oral   Take 120 mg by mouth daily.         Marland Kitchen GLIPIZIDE PO   Oral   Take by mouth.         Marland Kitchen HYDROcodone-acetaminophen (NORCO) 10-325 MG per tablet   Oral   Take 1 tablet by mouth every 6 (six) hours as needed.         . Liraglutide (VICTOZA) 18 MG/3ML SOPN   Subcutaneous   Inject 0.6 Units into the skin daily.         . methocarbamol (ROBAXIN) 500 MG tablet   Oral   Take 1 tablet (500 mg total) by  mouth 2 (two) times daily.   20 tablet   0   . atenolol-chlorthalidone (TENORETIC) 50-25 MG per tablet   Oral   Take 1 tablet by mouth daily.         . Fluticasone-Salmeterol (ADVAIR) 500-50 MCG/DOSE AEPB   Inhalation   Inhale 1 puff into the lungs every 12 (twelve) hours.         Marland Kitchen HYDROcodone-acetaminophen (NORCO) 7.5-325 MG per tablet   Oral   Take 1 tablet by mouth 2 (two) times daily as needed. pain         . HYDROcodone-acetaminophen (NORCO/VICODIN) 5-325 MG per tablet   Oral   Take 2 tablets by mouth every 6 (six) hours as needed for pain.   15 tablet   0   . ibuprofen (ADVIL,MOTRIN) 200 MG tablet   Oral   Take 400 mg by mouth every 6 (six) hours as needed.         . naproxen (NAPROSYN) 500 MG tablet   Oral   Take 1 tablet (500 mg total) by mouth 2 (two) times daily.   30 tablet   0    BP 130/80  Pulse 95  Temp(Src) 97.1 F (36.2 C) (Oral)  Resp 20  SpO2 96%  LMP 03/09/2013 Physical Exam  Nursing note and vitals reviewed. Constitutional: She is  oriented to person, place, and time. She appears well-developed and well-nourished. No distress.  +obese  HENT:  Head: Normocephalic and atraumatic.  Right Ear: Hearing, tympanic membrane, external ear and ear canal normal.  Left Ear: External ear and ear canal normal. No drainage or swelling. Tympanic membrane is injected, perforated, erythematous and retracted. Tympanic membrane mobility is abnormal. Decreased hearing is noted.  Ears:  Nose: Nose normal.  Mouth/Throat: Uvula is midline, oropharynx is clear and moist and mucous membranes are normal.  Eyes: Conjunctivae are normal. Right eye exhibits no discharge. Left eye exhibits no discharge.  Neck: Normal range of motion. Neck supple.  +left sided reactive anterior cervical lymphadenopathy  Cardiovascular: Normal rate, regular rhythm and normal heart sounds.   Pulmonary/Chest: Effort normal and breath sounds normal.  Musculoskeletal: Normal range of motion.  Lymphadenopathy:    She has cervical adenopathy.  Neurological: She is alert and oriented to person, place, and time.  Skin: Skin is warm and dry.  Psychiatric: She has a normal mood and affect. Her behavior is normal.    ED Course  Procedures (including critical care time) Labs Review Labs Reviewed - No data to display Imaging Review No results found.  EKG Interpretation    Date/Time:    Ventricular Rate:    PR Interval:    QRS Duration:   QT Interval:    QTC Calculation:   R Axis:     Text Interpretation:              MDM  AOM with small perforation. Cautioned patient against putting anything in left ear until follow up visit in 2 weeks with PCP to ensure perforation has healed. Will treat with amoxicillin and advise follow up.     [place], [place] 03/28/13 1920

## 2013-03-28 NOTE — ED Provider Notes (Signed)
Medical screening examination/treatment/procedure(s) were performed by non-physician practitioner and as supervising physician I was immediately available for consultation/collaboration.  Philipp Deputy, M.D.  Harden Mo, MD 03/28/13 2219

## 2013-03-28 NOTE — ED Notes (Signed)
C/O productive cough x 1 wk with brownish sputum.  C/O left earache and decreased hearing; "my hydrocodone isn't even touching the pain".  Has been using albuterol HFA and neb - last neb @ 1400, used HFA PTA.  States her fever has been up to 103.

## 2013-05-01 ENCOUNTER — Emergency Department: Payer: Self-pay | Admitting: Emergency Medicine

## 2013-07-07 ENCOUNTER — Emergency Department (HOSPITAL_COMMUNITY)
Admission: EM | Admit: 2013-07-07 | Discharge: 2013-07-07 | Disposition: A | Discharge status: Home / Self Care | Payer: Medicare Other | Attending: Emergency Medicine | Admitting: Emergency Medicine

## 2013-07-07 ENCOUNTER — Encounter (HOSPITAL_COMMUNITY): Payer: Self-pay | Admitting: Emergency Medicine

## 2013-07-07 ENCOUNTER — Emergency Department (HOSPITAL_COMMUNITY): Payer: Medicare Other

## 2013-07-07 DIAGNOSIS — I10 Essential (primary) hypertension: Secondary | ICD-10-CM | POA: Insufficient documentation

## 2013-07-07 DIAGNOSIS — Y9389 Activity, other specified: Secondary | ICD-10-CM | POA: Insufficient documentation

## 2013-07-07 DIAGNOSIS — F3289 Other specified depressive episodes: Secondary | ICD-10-CM | POA: Insufficient documentation

## 2013-07-07 DIAGNOSIS — W010XXA Fall on same level from slipping, tripping and stumbling without subsequent striking against object, initial encounter: Secondary | ICD-10-CM | POA: Insufficient documentation

## 2013-07-07 DIAGNOSIS — Z8619 Personal history of other infectious and parasitic diseases: Secondary | ICD-10-CM | POA: Insufficient documentation

## 2013-07-07 DIAGNOSIS — E669 Obesity, unspecified: Secondary | ICD-10-CM | POA: Insufficient documentation

## 2013-07-07 DIAGNOSIS — Z8742 Personal history of other diseases of the female genital tract: Secondary | ICD-10-CM | POA: Insufficient documentation

## 2013-07-07 DIAGNOSIS — F17200 Nicotine dependence, unspecified, uncomplicated: Secondary | ICD-10-CM | POA: Insufficient documentation

## 2013-07-07 DIAGNOSIS — Z79899 Other long term (current) drug therapy: Secondary | ICD-10-CM | POA: Insufficient documentation

## 2013-07-07 DIAGNOSIS — F329 Major depressive disorder, single episode, unspecified: Secondary | ICD-10-CM | POA: Insufficient documentation

## 2013-07-07 DIAGNOSIS — Z3202 Encounter for pregnancy test, result negative: Secondary | ICD-10-CM | POA: Insufficient documentation

## 2013-07-07 DIAGNOSIS — Z880 Allergy status to penicillin: Secondary | ICD-10-CM | POA: Insufficient documentation

## 2013-07-07 DIAGNOSIS — S8390XA Sprain of unspecified site of unspecified knee, initial encounter: Secondary | ICD-10-CM

## 2013-07-07 DIAGNOSIS — S93409A Sprain of unspecified ligament of unspecified ankle, initial encounter: Secondary | ICD-10-CM

## 2013-07-07 DIAGNOSIS — J45909 Unspecified asthma, uncomplicated: Secondary | ICD-10-CM | POA: Insufficient documentation

## 2013-07-07 DIAGNOSIS — IMO0002 Reserved for concepts with insufficient information to code with codable children: Secondary | ICD-10-CM | POA: Insufficient documentation

## 2013-07-07 DIAGNOSIS — Y929 Unspecified place or not applicable: Secondary | ICD-10-CM | POA: Insufficient documentation

## 2013-07-07 LAB — POC URINE PREG, ED: Preg Test, Ur: NEGATIVE

## 2013-07-07 MED ORDER — METHOCARBAMOL 500 MG PO TABS: 500.0000 mg | ORAL_TABLET | Freq: Two times a day (BID) | ORAL | Status: DC

## 2013-07-07 MED ORDER — HYDROMORPHONE HCL PF 1 MG/ML IJ SOLN
1.0000 mg | Freq: Once | INTRAMUSCULAR | Status: AC
  Administered 2013-07-07: 1 mg via INTRAMUSCULAR
  Filled 2013-07-07: qty 1

## 2013-07-07 MED ORDER — OXYCODONE-ACETAMINOPHEN 5-325 MG PO TABS: 2.0000 | ORAL_TABLET | ORAL | Status: DC | PRN

## 2013-07-07 NOTE — ED Notes (Signed)
Warm blankets applied
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
## 2013-07-07 NOTE — ED Notes (Signed)
Pt. Stated that she feels better.
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
## 2013-07-07 NOTE — Discharge Instructions (Signed)

## 2013-08-02 ENCOUNTER — Emergency Department: Payer: Self-pay | Admitting: Emergency Medicine

## 2013-08-02 LAB — COMPREHENSIVE METABOLIC PANEL
ALBUMIN: 3.8 g/dL (ref 3.4–5.0)
ANION GAP: 8 (ref 7–16)
Alkaline Phosphatase: 51 U/L
BUN: 16 mg/dL (ref 7–18)
Bilirubin,Total: 0.3 mg/dL (ref 0.2–1.0)
Calcium, Total: 9 mg/dL (ref 8.5–10.1)
Chloride: 108 mmol/L — ABNORMAL HIGH (ref 98–107)
Co2: 23 mmol/L (ref 21–32)
Creatinine: 0.81 mg/dL (ref 0.60–1.30)
EGFR (African American): >60
EGFR (Non-African Amer.): >60
Glucose: 126 mg/dL — ABNORMAL HIGH (ref 65–99)
Osmolality: 280 (ref 275–301)
Potassium: 4 mmol/L (ref 3.5–5.1)
SGOT(AST): 33 U/L (ref 15–37)
SGPT (ALT): 44 U/L (ref 12–78)
SODIUM: 139 mmol/L (ref 136–145)
Total Protein: 7.6 g/dL (ref 6.4–8.2)

## 2013-08-02 LAB — CBC WITH DIFFERENTIAL/PLATELET
BASOS ABS: 0.1 10*3/uL (ref 0.0–0.1)
BASOS PCT: 1 %
EOS ABS: 0.2 10*3/uL (ref 0.0–0.7)
Eosinophil %: 2 %
HCT: 44 % (ref 35.0–47.0)
HGB: 15.3 g/dL (ref 12.0–16.0)
Lymphocyte #: 3.2 10*3/uL (ref 1.0–3.6)
Lymphocyte %: 35.2 %
MCH: 30.4 pg (ref 26.0–34.0)
MCHC: 34.7 g/dL (ref 32.0–36.0)
MCV: 88 fL (ref 80–100)
Monocyte #: 0.5 x10 3/mm (ref 0.2–0.9)
Monocyte %: 5.9 %
NEUTROS PCT: 55.9 %
Neutrophil #: 5 10*3/uL (ref 1.4–6.5)
Platelet: 262 10*3/uL (ref 150–440)
RBC: 5.03 10*6/uL (ref 3.80–5.20)
RDW: 13 % (ref 11.5–14.5)
WBC: 9 10*3/uL (ref 3.6–11.0)

## 2013-08-02 LAB — URINALYSIS, COMPLETE
BILIRUBIN, UR: NEGATIVE
BLOOD: NEGATIVE
Glucose,UR: NEGATIVE mg/dL (ref 0–75)
KETONE: NEGATIVE
Nitrite: NEGATIVE
PH: 5 (ref 4.5–8.0)
Protein: NEGATIVE
RBC,UR: 3 /HPF (ref 0–5)
Specific Gravity: 1.028 (ref 1.003–1.030)
WBC UR: 4 /HPF (ref 0–5)

## 2013-08-02 LAB — HCG, QUANTITATIVE, PREGNANCY: Beta Hcg, Quant.: <1 m[IU]/mL — ABNORMAL LOW

## 2013-08-03 LAB — WET PREP, GENITAL

## 2013-08-03 LAB — GC/CHLAMYDIA PROBE AMP

## 2013-08-29 ENCOUNTER — Emergency Department (HOSPITAL_COMMUNITY): Payer: Medicare Other

## 2013-08-29 ENCOUNTER — Emergency Department (HOSPITAL_COMMUNITY)
Admission: EM | Admit: 2013-08-29 | Discharge: 2013-08-29 | Disposition: A | Discharge status: Home / Self Care | Payer: Medicare Other | Attending: Emergency Medicine | Admitting: Emergency Medicine

## 2013-08-29 ENCOUNTER — Encounter (HOSPITAL_COMMUNITY): Payer: Self-pay | Admitting: Emergency Medicine

## 2013-08-29 DIAGNOSIS — E669 Obesity, unspecified: Secondary | ICD-10-CM | POA: Diagnosis not present

## 2013-08-29 DIAGNOSIS — IMO0002 Reserved for concepts with insufficient information to code with codable children: Secondary | ICD-10-CM | POA: Diagnosis present

## 2013-08-29 DIAGNOSIS — Z8619 Personal history of other infectious and parasitic diseases: Secondary | ICD-10-CM | POA: Diagnosis not present

## 2013-08-29 DIAGNOSIS — Z8742 Personal history of other diseases of the female genital tract: Secondary | ICD-10-CM | POA: Diagnosis not present

## 2013-08-29 DIAGNOSIS — W108XXA Fall (on) (from) other stairs and steps, initial encounter: Secondary | ICD-10-CM | POA: Insufficient documentation

## 2013-08-29 DIAGNOSIS — Y9389 Activity, other specified: Secondary | ICD-10-CM | POA: Insufficient documentation

## 2013-08-29 DIAGNOSIS — Z862 Personal history of diseases of the blood and blood-forming organs and certain disorders involving the immune mechanism: Secondary | ICD-10-CM | POA: Insufficient documentation

## 2013-08-29 DIAGNOSIS — Z79899 Other long term (current) drug therapy: Secondary | ICD-10-CM | POA: Insufficient documentation

## 2013-08-29 DIAGNOSIS — Y9289 Other specified places as the place of occurrence of the external cause: Secondary | ICD-10-CM | POA: Insufficient documentation

## 2013-08-29 DIAGNOSIS — Z880 Allergy status to penicillin: Secondary | ICD-10-CM | POA: Diagnosis not present

## 2013-08-29 DIAGNOSIS — M545 Low back pain, unspecified: Secondary | ICD-10-CM

## 2013-08-29 DIAGNOSIS — I10 Essential (primary) hypertension: Secondary | ICD-10-CM | POA: Insufficient documentation

## 2013-08-29 DIAGNOSIS — W19XXXA Unspecified fall, initial encounter: Secondary | ICD-10-CM

## 2013-08-29 DIAGNOSIS — Z8639 Personal history of other endocrine, nutritional and metabolic disease: Secondary | ICD-10-CM | POA: Insufficient documentation

## 2013-08-29 DIAGNOSIS — J45909 Unspecified asthma, uncomplicated: Secondary | ICD-10-CM | POA: Insufficient documentation

## 2013-08-29 DIAGNOSIS — F17200 Nicotine dependence, unspecified, uncomplicated: Secondary | ICD-10-CM | POA: Diagnosis not present

## 2013-08-29 DIAGNOSIS — Z8659 Personal history of other mental and behavioral disorders: Secondary | ICD-10-CM | POA: Insufficient documentation

## 2013-08-29 DIAGNOSIS — M533 Sacrococcygeal disorders, not elsewhere classified: Secondary | ICD-10-CM

## 2013-08-29 MED ORDER — TRAMADOL HCL 50 MG PO TABS: 50.0000 mg | ORAL_TABLET | Freq: Four times a day (QID) | ORAL | Status: DC | PRN

## 2013-08-29 MED ORDER — HYDROCODONE-ACETAMINOPHEN 5-325 MG PO TABS: 2.0000 | ORAL_TABLET | Freq: Four times a day (QID) | ORAL | Status: DC | PRN

## 2013-08-29 MED ORDER — NAPROXEN 500 MG PO TABS: 500.0000 mg | ORAL_TABLET | Freq: Two times a day (BID) | ORAL | Status: DC

## 2013-08-29 MED ORDER — HYDROCODONE-ACETAMINOPHEN 5-325 MG PO TABS
2.0000 | ORAL_TABLET | Freq: Once | ORAL | Status: AC
  Administered 2013-08-29: 2 via ORAL
  Filled 2013-08-29: qty 2

## 2013-08-29 NOTE — ED Provider Notes (Signed)
CSN: [id number]     Arrival date & time 08/29/13  1738 History  This chart was scribed for non-physician practitioner working with Ezequiel Essex, MD by Mercy Everman, ED Scribe. This patient was seen in room TR09C/TR09C and the patient's care was started at 7:38 PM.   Chief Complaint  Patient presents with  . Fall     The history is provided by the patient. No language interpreter was used.   HPI Comments: Rebecca Moyer is a 28 y.o. female who presents to the Emergency Department after falling down several steps. Patient reports slipping, landing on her buttocks and sliding down 5 steps. Patient denies LOC, but reports hitting the back of her head. Patient is now complaining of lower back pain. She denies head pain. Patient is ambulatory, but does report increased pain with ambulation.  Patient denies visual changes, nausea, vomiting, or numbness and tingling in lower extremities.  She reports that she is not on any blood thinners.      Past Medical History  Diagnosis Date  . Asthma 06/07/11  . H/O varicella   . H/O amenorrhea 01/27/04  . Irregular periods/menstrual cycles 05/22/04  . Depression 06/07/11  . Hypertension 06/07/11  . PCOS (polycystic ovarian syndrome) 06/07/11  . Obesity 06/07/11  . Smoker 06/07/11   Past Surgical History  Procedure Laterality Date  . Tonsillectomy  06/07/11  . Wisdom tooth extraction  06/07/11  . Adnoids  06/07/11   History reviewed. No pertinent family history. History  Substance Use Topics  . Smoking status: Current Every Day Smoker -- 0.50 packs/day    Types: Cigarettes  . Smokeless tobacco: Not on file     Comment: pt states she is try to quite as of now  . Alcohol Use: No   OB History   Grav Para Term Preterm Abortions TAB SAB Ect Mult Living                 Review of Systems  Constitutional: Negative for fever and chills.  Eyes: Negative for visual disturbance.  Gastrointestinal: Negative for nausea and vomiting.  Musculoskeletal:  Positive for back pain.  Skin: Negative for color change.  Neurological: Negative for weakness, numbness and headaches.      Allergies  Penicillins and Triamterene  Home Medications   Prior to Admission medications   Medication Sig Start Date End Date Taking? Authorizing Provider  albuterol (PROVENTIL HFA;VENTOLIN HFA) 108 (90 BASE) MCG/ACT inhaler Inhale 2 puffs into the lungs every 6 (six) hours as needed. Wheezing    Historical Provider, MD  albuterol (PROVENTIL) (2.5 MG/3ML) 0.083% nebulizer solution Take 2.5 mg by nebulization every 8 (eight) hours as needed. Shortness of breath    Historical Provider, MD  ALPRAZolam Duanne Moron) 1 MG tablet Take 1 mg by mouth 3 (three) times daily. Scheduled doses for anxiety    Historical Provider, MD  atenolol-chlorthalidone (TENORETIC) 50-25 MG per tablet Take 1 tablet by mouth daily.    Historical Provider, MD  diltiazem (CARDIZEM) 120 MG tablet Take 120 mg by mouth daily.    Historical Provider, MD  Fluticasone-Salmeterol (ADVAIR) 500-50 MCG/DOSE AEPB Inhale 1 puff into the lungs every 12 (twelve) hours.    Historical Provider, MD  HYDROcodone-acetaminophen (NORCO) 10-325 MG per tablet Take 1 tablet by mouth every 6 (six) hours as needed.    Historical Provider, MD  Liraglutide (VICTOZA) 18 MG/3ML SOPN Inject 0.6 Units into the skin daily.    Historical Provider, MD  methocarbamol (ROBAXIN) 500 MG tablet  Take 1 tablet (500 mg total) by mouth 2 (two) times daily. 07/07/13   Leota Jacobsen, MD  oxyCODONE-acetaminophen (PERCOCET/ROXICET) 5-325 MG per tablet Take 2 tablets by mouth every 4 (four) hours as needed for severe pain. 07/07/13   Leota Jacobsen, MD   Triage Vitals: BP 144/84  Pulse 79  Temp(Src) 98.3 F (36.8 C) (Oral)  Resp 18  Ht 5' 9.5" (1.765 m)  Wt 266 lb (120.657 kg)  BMI 38.73 kg/m2  SpO2 94%  LMP 08/10/2013 Physical Exam  Nursing note and vitals reviewed. Constitutional: She is oriented to person, place, and time. She appears  well-developed and well-nourished. No distress.  HENT:  Head: Normocephalic and atraumatic.  No hematoma or other signs of deformity.   Eyes: EOM are normal. Pupils are equal, round, and reactive to light.  Neck: Normal range of motion. Neck supple.  Cardiovascular: Normal rate, regular rhythm, normal heart sounds and intact distal pulses.   Pulmonary/Chest: Effort normal and breath sounds normal. No respiratory distress.  Musculoskeletal: Normal range of motion.       Cervical back: She exhibits normal range of motion, no tenderness, no bony tenderness, no swelling, no edema and no deformity.       Thoracic back: She exhibits normal range of motion, no tenderness, no bony tenderness, no swelling, no edema and no deformity.  Tenderness to palpation of lumbar spine. No stepoffs or deformities. Normal gait. Full ROM of lower and upper extremities.Tenderness over coccyx.    Neurological: She is alert and oriented to person, place, and time. She has normal strength. No cranial nerve deficit or sensory deficit. Gait normal.  Skin: Skin is warm and dry.  No bruising visualized.  Psychiatric: She has a normal mood and affect. Her behavior is normal.    ED Course  Procedures (including critical care time) COORDINATION OF CARE:  7:41 PM- Discussed treatment plan with patient at bedside and patient agreed to plan.   Labs Review Labs Reviewed - No data to display  Imaging Review Dg Lumbar Spine Complete  08/29/2013   CLINICAL DATA:  Fall with low back pain.  EXAM: LUMBAR SPINE - COMPLETE 4+ VIEW  COMPARISON:  12/28/2012 radiographs  FINDINGS: There is no evidence of lumbar spine fracture. Alignment is normal. Intervertebral disc spaces are maintained.  IMPRESSION: Negative.   Electronically Signed   By: Hassan Rowan M.D.   On: 08/29/2013 20:31   Dg Sacrum/coccyx  08/29/2013   CLINICAL DATA:  Coccyx pain following fall  : SACRUM AND COCCYX - 2+ VIEW  COMPARISON:  None.  FINDINGS: There is no evidence  of fracture or other focal bone lesions  IMPRESSION: Negative.   Electronically Signed   By: Hassan Rowan M.D.   On: 08/29/2013 20:30     EKG Interpretation None      MDM   Final diagnoses:  None   Patient presenting after falling down some steps and landing on her buttocks.  Patient able to ambulate.  She reports hitting her head, but no signs of head trauma on exam.  Patient with a normal neurological exam.  Therefore, do not feel any imaging is indicated at this time.  Patient with negative xrays.  Patient stable for discharge.  Return precautions.  I personally performed the services described in this documentation, which was scribed in my presence. The recorded information has been reviewed and is accurate.    Hyman Bible, PA-C 08/29/13 2111

## 2013-08-29 NOTE — ED Notes (Signed)
Pt asking how long before being seen tired of waiting

## 2013-08-29 NOTE — ED Notes (Signed)
The pt fell earlier today 2 hours ago she slipped and fell.  She has chronic back pain

## 2013-08-29 NOTE — ED Notes (Deleted)
Today was the last day of her regular period. After her period ended she started to have severe pelvic pain radiating into back. She states "it feels like my endometriosis." she has appt with OBGYN on 6/22 for pelvic pain but pain was more severe today

## 2013-08-29 NOTE — ED Provider Notes (Signed)
Medical screening examination/treatment/procedure(s) were performed by non-physician practitioner and as supervising physician I was immediately available for consultation/collaboration.   EKG Interpretation None        Ezequiel Essex, MD 08/29/13 2325

## 2013-08-29 NOTE — ED Notes (Signed)
Pt still waiting to be seen.

## 2013-08-29 NOTE — Discharge Instructions (Signed)
Take pain medication as needed for pain.  Do not drive or operate heavy machinery for 4-6 hours after taking pain medication. °

## 2013-08-29 NOTE — ED Notes (Signed)
Pt. Slipped and fell down 5 steps on her buttocks.  Having lower back pain, no visible marks noted.  Pt. Did hit the back of her head. No hematoma or swelling noted.  She denies LOC, Pt. Is alert and oriented X3.

## 2013-08-29 NOTE — ED Notes (Signed)
Declined W/C at D/C and was escorted to lobby by RN. 

## 2013-11-18 ENCOUNTER — Emergency Department (HOSPITAL_COMMUNITY)
Admission: EM | Admit: 2013-11-18 | Discharge: 2013-11-18 | Disposition: A | Discharge status: Home / Self Care | Payer: Medicare Other | Attending: Emergency Medicine | Admitting: Emergency Medicine

## 2013-11-18 DIAGNOSIS — Z760 Encounter for issue of repeat prescription: Secondary | ICD-10-CM | POA: Diagnosis present

## 2013-11-18 DIAGNOSIS — J45909 Unspecified asthma, uncomplicated: Secondary | ICD-10-CM | POA: Diagnosis not present

## 2013-11-18 DIAGNOSIS — Z79899 Other long term (current) drug therapy: Secondary | ICD-10-CM | POA: Insufficient documentation

## 2013-11-18 DIAGNOSIS — F329 Major depressive disorder, single episode, unspecified: Secondary | ICD-10-CM | POA: Diagnosis not present

## 2013-11-18 DIAGNOSIS — F3289 Other specified depressive episodes: Secondary | ICD-10-CM | POA: Insufficient documentation

## 2013-11-18 DIAGNOSIS — F17200 Nicotine dependence, unspecified, uncomplicated: Secondary | ICD-10-CM | POA: Diagnosis not present

## 2013-11-18 DIAGNOSIS — Z8619 Personal history of other infectious and parasitic diseases: Secondary | ICD-10-CM | POA: Diagnosis not present

## 2013-11-18 DIAGNOSIS — E119 Type 2 diabetes mellitus without complications: Secondary | ICD-10-CM | POA: Insufficient documentation

## 2013-11-18 DIAGNOSIS — Z880 Allergy status to penicillin: Secondary | ICD-10-CM | POA: Diagnosis not present

## 2013-11-18 DIAGNOSIS — I10 Essential (primary) hypertension: Secondary | ICD-10-CM | POA: Insufficient documentation

## 2013-11-18 DIAGNOSIS — E669 Obesity, unspecified: Secondary | ICD-10-CM | POA: Diagnosis not present

## 2013-11-18 DIAGNOSIS — Z8742 Personal history of other diseases of the female genital tract: Secondary | ICD-10-CM | POA: Diagnosis not present

## 2013-11-18 LAB — CBG MONITORING, ED: GLUCOSE-CAPILLARY: 132 mg/dL — AB (ref 70–99)

## 2013-11-18 MED ORDER — CANAGLIFLOZIN 100 MG PO TABS: 100.0000 mg | ORAL_TABLET | Freq: Every day | ORAL | Status: DC

## 2013-11-18 MED ORDER — HYDROCODONE-ACETAMINOPHEN 5-325 MG PO TABS
2.0000 | ORAL_TABLET | Freq: Once | ORAL | Status: AC
  Administered 2013-11-18: 2 via ORAL
  Filled 2013-11-18: qty 2

## 2013-11-18 MED ORDER — LIRAGLUTIDE 18 MG/3ML ~~LOC~~ SOPN: 18.0000 [IU] | PEN_INJECTOR | Freq: Every day | SUBCUTANEOUS | Status: DC

## 2013-11-18 NOTE — Discharge Instructions (Signed)
Medication Refill, Emergency Department °We have refilled your medication today as a courtesy to you. It is best for your medical care, however, to take care of getting refills done through your primary caregiver's office. They have your records and can do a better job of follow-up than we can in the emergency department. °On maintenance medications, we often only prescribe enough medications to get you by until you are able to see your regular caregiver. This is a more expensive way to refill medications. °In the future, please plan for refills so that you will not have to use the emergency department for this. °Thank you for your help. Your help allows us to better take care of the daily emergencies that enter our department. °Document Released: 06/15/2003 Document Revised: 05/21/2011 Document Reviewed: 06/05/2013 °[employer]® Patient Information ©2015 [employer]. This information is not intended to replace advice given to you by your health care provider. Make sure you discuss any questions you have with your health care provider. ° °

## 2013-11-18 NOTE — ED Provider Notes (Signed)
CSN: [id number]     Arrival date & time 11/18/13  1722 History  This chart was scribed for non-physician practitioner, Michele Mcalpine, PA-C, working with Evelina Bucy, MD by Lowella Petties, ED Scribe. The patient was seen in room TR07C/TR07C. Patient's care was started at [facility].     Chief Complaint  Patient presents with  . Medication Refill   The history is provided by the patient. No language interpreter was used.   HPI Comments: Rebecca Moyer is a 28 y.o. female who presents to the Emergency Department complaining that she has run out of her prescribed medications including Victoza, Invokana, Xanax, and Hydrocodone onset 3 days ago, and she states that she has been "feeling funny". She states that she has DM and is not insulin dependent. She reports taking Hydrocodone for chronic pain in her knees. She reports that her old PCP was Dr. Jimmye Norman, and that he was prescribing her the Hydrocodone. She reports that she is transferring to a new PCP and has an appointment scheduled for next Tuesday. She denies any other medical problems or acute distress.   Past Medical History  Diagnosis Date  . Asthma 06/07/11  . H/O varicella   . H/O amenorrhea 01/27/04  . Irregular periods/menstrual cycles 05/22/04  . Depression 06/07/11  . Hypertension 06/07/11  . PCOS (polycystic ovarian syndrome) 06/07/11  . Obesity 06/07/11  . Smoker 06/07/11   Past Surgical History  Procedure Laterality Date  . Tonsillectomy  06/07/11  . Wisdom tooth extraction  06/07/11  . Adnoids  06/07/11   No family history on file. History  Substance Use Topics  . Smoking status: Current Every Day Smoker -- 0.50 packs/day    Types: Cigarettes  . Smokeless tobacco: Not on file     Comment: pt states she is try to quite as of now  . Alcohol Use: No   OB History   Grav Para Term Preterm Abortions TAB SAB Ect Mult Living                 Review of Systems A complete 10 system review of systems was obtained and all systems are  negative except as noted in the HPI and PMH.   Allergies  Penicillins; Triamterene; and Ultram  Home Medications   Prior to Admission medications   Medication Sig Start Date End Date Taking? Authorizing Provider  albuterol (PROVENTIL HFA;VENTOLIN HFA) 108 (90 BASE) MCG/ACT inhaler Inhale 2 puffs into the lungs every 6 (six) hours as needed. Wheezing   Yes Historical Provider, MD  albuterol (PROVENTIL) (2.5 MG/3ML) 0.083% nebulizer solution Take 2.5 mg by nebulization every 8 (eight) hours as needed. Shortness of breath   Yes Historical Provider, MD  ALPRAZolam (XANAX) 1 MG tablet Take 1 mg by mouth 3 (three) times daily. Scheduled doses for anxiety   Yes Historical Provider, MD  atenolol-chlorthalidone (TENORETIC) 50-25 MG per tablet Take 1 tablet by mouth daily.   Yes Historical Provider, MD  diltiazem (CARDIZEM) 120 MG tablet Take 120 mg by mouth daily.   Yes Historical Provider, MD  Fluticasone-Salmeterol (ADVAIR) 500-50 MCG/DOSE AEPB Inhale 1 puff into the lungs every 12 (twelve) hours.   Yes Historical Provider, MD  HYDROcodone-acetaminophen (NORCO) 10-325 MG per tablet Take 1 tablet by mouth every 6 (six) hours as needed.   Yes Historical Provider, MD  Liraglutide (VICTOZA) 18 MG/3ML SOPN Inject 0.6 Units into the skin daily.   Yes Historical Provider, MD  Canagliflozin (INVOKANA) 100 MG TABS Take 1  tablet (100 mg total) by mouth daily. 11/18/13   Illene Labrador, PA-C  Liraglutide (VICTOZA) 18 MG/3ML SOPN Inject 18 Units into the skin daily. 11/18/13   Illene Labrador, PA-C   Triage Vitals: BP 137/85  Pulse 98  Temp(Src) 98.3 F (36.8 C) (Oral)  SpO2 97% Physical Exam  Nursing note and vitals reviewed. Constitutional: She is oriented to person, place, and time. She appears well-developed and well-nourished. No distress.  HENT:  Head: Normocephalic and atraumatic.  Mouth/Throat: Oropharynx is clear and moist.  Eyes: Conjunctivae and EOM are normal.  Neck: Normal range of motion. Neck  supple.  Cardiovascular: Normal rate, regular rhythm and normal heart sounds.   Pulmonary/Chest: Effort normal and breath sounds normal. No respiratory distress.  Musculoskeletal: Normal range of motion. She exhibits no edema.  Neurological: She is alert and oriented to person, place, and time. No sensory deficit.  Skin: Skin is warm and dry.  Psychiatric: She has a normal mood and affect. Her behavior is normal.    ED Course  Procedures (including critical care time)  6:46 PM-Discussed treatment plan which includes a refill of her Diabetes medications and a single dose of Hydrocodone in the ED with pt at bedside and pt agreed to plan.   Labs Review Labs Reviewed  CBG MONITORING, ED - Abnormal; Notable for the following:    Glucose-Capillary 132 (*)    All other components within normal limits   Imaging Review No results found.   EKG Interpretation None      MDM   Final diagnoses:  Medication refill   Patient is in for medication refill. She is nontoxic appearing distress. Afebrile, vital signs stable. CBG 132. I will prescribe her her diabetic medications, however I do not feel hydrocodone and Xanax are appropriate to be prescribed for chronic pain and anxiety in the emergency department. Patient understands and will followup with her PCP. Stable for discharge. Return precautions given. Patient states understanding of treatment care plan and is agreeable.   I personally performed the services described in this documentation, which was scribed in my presence. The recorded information has been reviewed and is accurate.   Illene Labrador, PA-C 11/18/13 [phone]

## 2013-11-18 NOTE — ED Notes (Signed)
Refused Wheelchair

## 2013-11-18 NOTE — ED Provider Notes (Signed)
Medical screening examination/treatment/procedure(s) were performed by non-physician practitioner and as supervising physician I was immediately available for consultation/collaboration.   EKG Interpretation None        Evelina Bucy, MD 11/18/13 Einar Crow

## 2013-11-18 NOTE — ED Notes (Signed)
Patient states that she has been out of her "diabetes medicine", xanax, and hydrocodone for several days and does not have an appointment till Tuesday. States that she hasn't been able to sleep and her knees are starting to hurt.

## 2014-01-01 ENCOUNTER — Emergency Department (HOSPITAL_COMMUNITY)
Admission: EM | Admit: 2014-01-01 | Discharge: 2014-01-01 | Disposition: A | Discharge status: Home / Self Care | Payer: Medicare Other | Attending: Emergency Medicine | Admitting: Emergency Medicine

## 2014-01-01 ENCOUNTER — Emergency Department (HOSPITAL_COMMUNITY): Payer: Medicare Other

## 2014-01-01 ENCOUNTER — Encounter (HOSPITAL_COMMUNITY): Payer: Self-pay | Admitting: Emergency Medicine

## 2014-01-01 DIAGNOSIS — E669 Obesity, unspecified: Secondary | ICD-10-CM | POA: Diagnosis not present

## 2014-01-01 DIAGNOSIS — Y929 Unspecified place or not applicable: Secondary | ICD-10-CM | POA: Diagnosis not present

## 2014-01-01 DIAGNOSIS — Z720 Tobacco use: Secondary | ICD-10-CM | POA: Insufficient documentation

## 2014-01-01 DIAGNOSIS — I10 Essential (primary) hypertension: Secondary | ICD-10-CM | POA: Diagnosis not present

## 2014-01-01 DIAGNOSIS — J441 Chronic obstructive pulmonary disease with (acute) exacerbation: Secondary | ICD-10-CM | POA: Diagnosis not present

## 2014-01-01 DIAGNOSIS — Z7952 Long term (current) use of systemic steroids: Secondary | ICD-10-CM | POA: Insufficient documentation

## 2014-01-01 DIAGNOSIS — T6391XA Toxic effect of contact with unspecified venomous animal, accidental (unintentional), initial encounter: Secondary | ICD-10-CM

## 2014-01-01 DIAGNOSIS — Z79899 Other long term (current) drug therapy: Secondary | ICD-10-CM | POA: Insufficient documentation

## 2014-01-01 DIAGNOSIS — Z880 Allergy status to penicillin: Secondary | ICD-10-CM | POA: Insufficient documentation

## 2014-01-01 DIAGNOSIS — Y939 Activity, unspecified: Secondary | ICD-10-CM | POA: Insufficient documentation

## 2014-01-01 DIAGNOSIS — Z792 Long term (current) use of antibiotics: Secondary | ICD-10-CM | POA: Diagnosis not present

## 2014-01-01 DIAGNOSIS — T63444A Toxic effect of venom of bees, undetermined, initial encounter: Secondary | ICD-10-CM | POA: Insufficient documentation

## 2014-01-01 DIAGNOSIS — F329 Major depressive disorder, single episode, unspecified: Secondary | ICD-10-CM | POA: Diagnosis not present

## 2014-01-01 DIAGNOSIS — Z8742 Personal history of other diseases of the female genital tract: Secondary | ICD-10-CM | POA: Insufficient documentation

## 2014-01-01 DIAGNOSIS — R0602 Shortness of breath: Secondary | ICD-10-CM

## 2014-01-01 MED ORDER — DIPHENHYDRAMINE HCL 25 MG PO CAPS: 25.0000 mg | ORAL_CAPSULE | Freq: Four times a day (QID) | ORAL | Status: DC | PRN

## 2014-01-01 MED ORDER — IPRATROPIUM BROMIDE 0.02 % IN SOLN
0.5000 mg | Freq: Once | RESPIRATORY_TRACT | Status: AC
  Administered 2014-01-01: 0.5 mg via RESPIRATORY_TRACT
  Filled 2014-01-01: qty 2.5

## 2014-01-01 MED ORDER — OXYCODONE-ACETAMINOPHEN 5-325 MG PO TABS
1.0000 | ORAL_TABLET | Freq: Once | ORAL | Status: AC
  Administered 2014-01-01: 1 via ORAL
  Filled 2014-01-01: qty 1

## 2014-01-01 MED ORDER — DIPHENHYDRAMINE HCL 50 MG/ML IJ SOLN
25.0000 mg | Freq: Once | INTRAMUSCULAR | Status: AC
  Administered 2014-01-01: 25 mg via INTRAVENOUS
  Filled 2014-01-01: qty 1

## 2014-01-01 MED ORDER — DOXYCYCLINE HYCLATE 100 MG PO TABS
100.0000 mg | ORAL_TABLET | Freq: Two times a day (BID) | ORAL | Status: DC
Start: 2014-01-01 — End: 2014-03-17

## 2014-01-01 MED ORDER — FENTANYL CITRATE 0.05 MG/ML IJ SOLN
50.0000 ug | Freq: Once | INTRAMUSCULAR | Status: DC
  Filled 2014-01-01: qty 2

## 2014-01-01 MED ORDER — PREDNISONE 50 MG PO TABS
50.0000 mg | ORAL_TABLET | Freq: Every day | ORAL | Status: DC
Start: 2014-01-01 — End: 2014-08-21

## 2014-01-01 MED ORDER — ALBUTEROL (5 MG/ML) CONTINUOUS INHALATION SOLN
10.0000 mg/h | INHALATION_SOLUTION | Freq: Once | RESPIRATORY_TRACT | Status: AC
  Administered 2014-01-01: 10 mg/h via RESPIRATORY_TRACT
  Filled 2014-01-01: qty 20

## 2014-01-01 MED ORDER — ALBUTEROL SULFATE HFA 108 (90 BASE) MCG/ACT IN AERS: 2.0000 | INHALATION_SPRAY | Freq: Four times a day (QID) | RESPIRATORY_TRACT | Status: AC | PRN

## 2014-01-01 MED ORDER — PREDNISONE 20 MG PO TABS
60.0000 mg | ORAL_TABLET | Freq: Once | ORAL | Status: AC
  Administered 2014-01-01: 60 mg via ORAL
  Filled 2014-01-01: qty 3

## 2014-01-01 NOTE — ED Provider Notes (Signed)
CSN: [id number]     Arrival date & time 01/01/14  1422 History   First MD Initiated Contact with Patient 01/01/14 1510     Chief Complaint  Patient presents with  . Allergic Reaction    bees, chest tightness     (Consider location/radiation/quality/duration/timing/severity/associated sxs/prior Treatment) HPI Comments: Pt comes in with cc of yellow jacket stings. Pt was walking through the yard, and walked on a nest. Pt was instantly stung by several blue jackets, mostly in her arms and legs. She has no hx of allergies, but her breathing started geting worse, so she comes to the ER. Pt has hx of COPD, and her cough and wheezing started 4 days ago. Pt has green phlegm, she is active smoker. No n/v/f/c. No asthma at home, needs RX.  Patient is a 28 y.o. female presenting with allergic reaction. The history is provided by the patient.  Allergic Reaction Presenting symptoms: wheezing   Presenting symptoms: no difficulty swallowing     Past Medical History  Diagnosis Date  . Asthma 06/07/11  . H/O varicella   . H/O amenorrhea 01/27/04  . Irregular periods/menstrual cycles 05/22/04  . Depression 06/07/11  . Hypertension 06/07/11  . PCOS (polycystic ovarian syndrome) 06/07/11  . Obesity 06/07/11  . Smoker 06/07/11   Past Surgical History  Procedure Laterality Date  . Tonsillectomy  06/07/11  . Wisdom tooth extraction  06/07/11  . Adnoids  06/07/11   No family history on file. History  Substance Use Topics  . Smoking status: Current Every Day Smoker -- 0.50 packs/day    Types: Cigarettes  . Smokeless tobacco: Not on file     Comment: pt states she is try to quite as of now  . Alcohol Use: No   OB History   Grav Para Term Preterm Abortions TAB SAB Ect Mult Living                 Review of Systems  Constitutional: Positive for activity change.  HENT: Negative for trouble swallowing and voice change.   Respiratory: Positive for cough and wheezing. Negative for shortness of breath.    Cardiovascular: Negative for chest pain.  Gastrointestinal: Negative for nausea, vomiting and abdominal pain.  Genitourinary: Negative for dysuria.  Musculoskeletal: Negative for neck pain.  Neurological: Negative for headaches.      Allergies  Penicillins; Naproxen; Triamterene; Ultram; and Fentanyl  Home Medications   Prior to Admission medications   Medication Sig Start Date End Date Taking? Authorizing Provider  albuterol (PROVENTIL HFA;VENTOLIN HFA) 108 (90 BASE) MCG/ACT inhaler Inhale 2 puffs into the lungs every 6 (six) hours as needed. Wheezing   Yes Historical Provider, MD  albuterol (PROVENTIL) (2.5 MG/3ML) 0.083% nebulizer solution Take 2.5 mg by nebulization every 8 (eight) hours as needed. Shortness of breath   Yes Historical Provider, MD  ALPRAZolam (XANAX) 1 MG tablet Take 1 mg by mouth 3 (three) times daily. Scheduled doses for anxiety   Yes Historical Provider, MD  atenolol-chlorthalidone (TENORETIC) 50-25 MG per tablet Take 1 tablet by mouth every evening.    Yes Historical Provider, MD  diltiazem (CARDIZEM) 120 MG tablet Take 120 mg by mouth daily.   Yes Historical Provider, MD  Liraglutide (VICTOZA) 18 MG/3ML SOPN Inject 18 Units into the skin daily.   Yes Historical Provider, MD  oxyCODONE-acetaminophen (PERCOCET/ROXICET) 5-325 MG per tablet Take 1 tablet by mouth every 8 (eight) hours as needed for severe pain.   Yes Historical Provider, MD  albuterol (PROVENTIL  HFA;VENTOLIN HFA) 108 (90 BASE) MCG/ACT inhaler Inhale 2 puffs into the lungs every 6 (six) hours as needed for wheezing or shortness of breath. 01/01/14   Varney Biles, MD  diphenhydrAMINE (BENADRYL) 25 mg capsule Take 1 capsule (25 mg total) by mouth every 6 (six) hours as needed for itching. 01/01/14   Varney Biles, MD  doxycycline (VIBRA-TABS) 100 MG tablet Take 1 tablet (100 mg total) by mouth 2 (two) times daily. 01/01/14   Varney Biles, MD  predniSONE (DELTASONE) 50 MG tablet Take 1 tablet (50 mg  total) by mouth daily. 01/01/14   Petrina Melby Kathrynn Humble, MD   BP 125/63  Pulse 85  Temp(Src) 97.8 F (36.6 C) (Oral)  Resp 27  Ht 5\' 9"  (1.753 m)  Wt 266 lb (120.657 kg)  BMI 39.26 kg/m2  SpO2 91%  LMP 12/18/2013 Physical Exam  Nursing note and vitals reviewed. Constitutional: She is oriented to person, place, and time. She appears well-developed and well-nourished.  HENT:  Head: Normocephalic and atraumatic.  Mouth/Throat: Oropharynx is clear and moist.  Eyes: EOM are normal. Pupils are equal, round, and reactive to light.  Neck: Neck supple.  Cardiovascular: Normal rate, regular rhythm and normal heart sounds.   No murmur heard. Pulmonary/Chest: Effort normal. No stridor. No respiratory distress. She has wheezes.  Abdominal: Soft. She exhibits no distension. There is no tenderness. There is no rebound and no guarding.  Neurological: She is alert and oriented to person, place, and time.  Skin: Skin is warm and dry.    ED Course  Procedures (including critical care time)  Smoking cessation instruction/counseling given:  counseled patient on the dangers of tobacco use, advised patient to stop smoking, and reviewed strategies to maximize success. Discussion 2-5 minutes.   Labs Review Labs Reviewed - No data to display  Imaging Review Dg Chest 2 View  01/01/2014   CLINICAL DATA:  Initial encounter. Hymenoptera envenomation. Bee stings. Short of breath. Burning in the chest.  EXAM: CHEST  2 VIEW  COMPARISON:  12/28/2012.  FINDINGS: Cardiopericardial silhouette within normal limits. Mediastinal contours normal. Trachea midline. No airspace disease or effusion. Monitoring leads project over the chest. Subsegmental atelectasis is present in the lingula, projecting over the cardiopericardial silhouette on the lateral view.  IMPRESSION: No active cardiopulmonary disease.   Electronically Signed   By: Dereck Ligas M.D.   On: 01/01/2014 16:57     EKG Interpretation   Date/Time:  Friday  January 01 2014 14:38:45 EDT Ventricular Rate:  100 PR Interval:  140 QRS Duration: 96 QT Interval:  364 QTC Calculation: 469 R Axis:   49 Text Interpretation:  Normal sinus rhythm Normal ECG unchanged ekg  Confirmed by Kathrynn Humble, MD, Thelma Comp [phone]) on 01/01/2014 3:32:36 PM      MDM   Final diagnoses:  Shortness of breath  COPD exacerbation  Sting, initial encounter   PT with bee sting bite. Localized reaction only. Pt has some wheezing and cough. Hx of COPD, and this appears more to be related to her copd exacerbation and smoking.  New cough. Will give tx and doxy, pred.  6:10 PM Repeat exam is better, still mild wheezing, but no hypoxia, resp distress. Will d.c.    Varney Biles, MD 01/01/14 [phone]

## 2014-01-01 NOTE — Discharge Instructions (Signed)
We saw you in the ER for after the stings and you have some wheezing. Take the meds as precribed. Stop smoking,   Bee, Wasp, or Hornet Sting Your caregiver has diagnosed you as having an insect sting. An insect sting appears as a red lump in the skin that sometimes has a tiny hole in the center, or it may have a stinger in the center of the wound. The most common stings are from wasps, hornets and bees. Individuals have different reactions to insect stings.  A normal reaction may cause pain, swelling, and redness around the sting site.  A localized allergic reaction may cause swelling and redness that extends beyond the sting site.  A large local reaction may continue to develop over the next 12 to 36 hours.  On occasion, the reactions can be severe (anaphylactic reaction). An anaphylactic reaction may cause wheezing; difficulty breathing; chest pain; fainting; raised, itchy, red patches on the skin; a sick feeling to your stomach (nausea); vomiting; cramping; or diarrhea. If you have had an anaphylactic reaction to an insect sting in the past, you are more likely to have one again. HOME CARE INSTRUCTIONS   With bee stings, a small sac of poison is left in the wound. Brushing across this with something such as a credit card, or anything similar, will help remove this and decrease the amount of the reaction. This same procedure will not help a wasp sting as they do not leave behind a stinger and poison sac.  Apply a cold compress for 10 to 20 minutes every hour for 1 to 2 days, depending on severity, to reduce swelling and itching.  To lessen pain, a paste made of water and baking soda may be rubbed on the bite or sting and left on for 5 minutes.  To relieve itching and swelling, you may use take medication or apply medicated creams or lotions as directed.  Only take over-the-counter or prescription medicines for pain, discomfort, or fever as directed by your caregiver.  Wash the sting  site daily with soap and water. Apply antibiotic ointment on the sting site as directed.  If you suffered a severe reaction:  If you did not require hospitalization, an adult will need to stay with you for 24 hours in case the symptoms return.  You may need to wear a medical bracelet or necklace stating the allergy.  You and your family need to learn when and how to use an anaphylaxis kit or epinephrine injection.  If you have had a severe reaction before, always carry your anaphylaxis kit with you. SEEK MEDICAL CARE IF:   None of the above helps within 2 to 3 days.  The area becomes red, warm, tender, and swollen beyond the area of the bite or sting.  You have an oral temperature above 102 F (38.9 C). SEEK IMMEDIATE MEDICAL CARE IF:  You have symptoms of an allergic reaction which are:  Wheezing.  Difficulty breathing.  Chest pain.  Lightheadedness or fainting.  Itchy, raised, red patches on the skin.  Nausea, vomiting, cramping or diarrhea. ANY OF THESE SYMPTOMS MAY REPRESENT A SERIOUS PROBLEM THAT IS AN EMERGENCY. Do not wait to see if the symptoms will go away. Get medical help right away. Call your local emergency services (911 in [place] DO NOT drive yourself to the hospital. MAKE SURE YOU:   Understand these instructions.  Will watch your condition.  Will get help right away if you are not doing well or get worse.  Document Released: 02/26/2005 Document Revised: 05/21/2011 Document Reviewed: 08/13/2009 [facility] Patient Information 2015 [place], [place]. This information is not intended to replace advice given to you by your health care provider. Make sure you discuss any questions you have with your health care provider.  Chronic Asthmatic Bronchitis Chronic asthmatic bronchitis is a complication of persistent asthma. After a period of time with asthma, some people develop airflow obstruction that is present all the time, even when not having an asthma attack.There  is also persistent inflammation of the airways, and the bronchial tubes produce more mucus. Chronic asthmatic bronchitis usually is a permanent problem with the lungs. CAUSES  Chronic asthmatic bronchitis happens most often in people who have asthma and also smoke cigarettes. Occasionally, it can happen to a person with long-standing or severe asthma even if the person is not a smoker. SIGNS AND SYMPTOMS  Chronic asthmatic bronchitis usually causes symptoms of both asthma and chronic bronchitis, including:   Coughing.  Increased sputum production.  Wheezing and shortness of breath.  Chest discomfort.  Recurring infections. DIAGNOSIS  Your health care provider will take a medical history and perform a physical exam. Chronic asthmatic bronchitis is suspected when a person with asthma has abnormal results on breathing tests (pulmonary function tests) even when breathing symptoms are at their best. Other tests, such as a chest X-ray, may be performed to rule out other conditions.  TREATMENT  Treatment involves controlling symptoms with medicine and lifestyle changes.  Your health care provider may prescribe asthma medicines, including inhaler and nebulizer medicines.  Infection can be treated with medicine to kill germs (antibiotics). Serious infections may require hospitalization. These can include:  Pneumonia.  Sinus infections.  Acute bronchitis.   Preventing infection and hospitalization is very important. Get an influenza vaccination every year as directed by your health care provider. Ask your health care provider whether you need a pneumonia vaccine.  Ask your health care provider whether you would benefit from a pulmonary rehabilitation program. HOME CARE INSTRUCTIONS  Take medicines only as directed by your health care provider.  If you are a cigarette smoker, the most important thing that you can do is quit. Talk to your health care provider for help with quitting  smoking.  Avoid pollen, dust, animal dander, molds, smoke, and other things that cause attacks.  Regular exercise is very important to help you feel better. Discuss possible exercise routines with your health care provider.  If animal dander is the cause of asthma, you may not be able to keep pets.  It is important that you:  Become educated about your medical condition.  Participate in maintaining wellness.  Seek medical care as directed. Delay in seeking medical care could cause permanent injury and may be a risk to your life. SEEK MEDICAL CARE IF:  You have wheezing and shortness of breath even if taking medicine to prevent attacks.  You have muscle aches, chest pain, or thickening of sputum.  Your sputum changes from clear or white to yellow, green, gray, or bloody. SEEK IMMEDIATE MEDICAL CARE IF:  Your usual medicines do not stop your wheezing.  You have increased coughing or shortness of breath or both.  You have increased difficulty breathing.  You have any problems from the medicine you are taking, such as a rash, itching, swelling, or trouble breathing. MAKE SURE YOU:   Understand these instructions.  Will watch your condition.  Will get help right away if you are not doing well or get worse. Document Released:  12/14/2005 Document Revised: 07/13/2013 Document Reviewed: 04/06/2013 [facility] Patient Information 2015 [place], [place]. This information is not intended to replace advice given to you by your health care provider. Make sure you discuss any questions you have with your health care provider.

## 2014-01-01 NOTE — ED Notes (Signed)
Pt states she was walking through the yard and thinks she was stung by yellow jackets. Feels like she is burning all over. Chest feels tight. Has multiple spots on her body from being stung.

## 2014-01-01 NOTE — ED Notes (Signed)
Pt refuses fentanyl, states "it makes my heart feel like it's jumping out of my chest"

## 2014-01-01 NOTE — ED Notes (Signed)
Patient transported to X-ray 

## 2014-01-14 ENCOUNTER — Emergency Department (HOSPITAL_COMMUNITY)
Admission: EM | Admit: 2014-01-14 | Discharge: 2014-01-14 | Disposition: A | Discharge status: Home / Self Care | Payer: Medicare Other | Attending: Emergency Medicine | Admitting: Emergency Medicine

## 2014-01-14 ENCOUNTER — Encounter (HOSPITAL_COMMUNITY): Payer: Self-pay | Admitting: Emergency Medicine

## 2014-01-14 DIAGNOSIS — L0231 Cutaneous abscess of buttock: Secondary | ICD-10-CM | POA: Diagnosis present

## 2014-01-14 DIAGNOSIS — Z792 Long term (current) use of antibiotics: Secondary | ICD-10-CM | POA: Insufficient documentation

## 2014-01-14 DIAGNOSIS — L02411 Cutaneous abscess of right axilla: Secondary | ICD-10-CM | POA: Insufficient documentation

## 2014-01-14 DIAGNOSIS — I10 Essential (primary) hypertension: Secondary | ICD-10-CM | POA: Diagnosis not present

## 2014-01-14 DIAGNOSIS — Z8742 Personal history of other diseases of the female genital tract: Secondary | ICD-10-CM | POA: Insufficient documentation

## 2014-01-14 DIAGNOSIS — Z8619 Personal history of other infectious and parasitic diseases: Secondary | ICD-10-CM | POA: Diagnosis not present

## 2014-01-14 DIAGNOSIS — Z79899 Other long term (current) drug therapy: Secondary | ICD-10-CM | POA: Diagnosis not present

## 2014-01-14 DIAGNOSIS — F329 Major depressive disorder, single episode, unspecified: Secondary | ICD-10-CM | POA: Diagnosis not present

## 2014-01-14 DIAGNOSIS — Z7952 Long term (current) use of systemic steroids: Secondary | ICD-10-CM | POA: Diagnosis not present

## 2014-01-14 DIAGNOSIS — Z720 Tobacco use: Secondary | ICD-10-CM | POA: Diagnosis not present

## 2014-01-14 DIAGNOSIS — J45909 Unspecified asthma, uncomplicated: Secondary | ICD-10-CM | POA: Insufficient documentation

## 2014-01-14 DIAGNOSIS — E669 Obesity, unspecified: Secondary | ICD-10-CM | POA: Insufficient documentation

## 2014-01-14 DIAGNOSIS — Z880 Allergy status to penicillin: Secondary | ICD-10-CM | POA: Diagnosis not present

## 2014-01-14 MED ORDER — LIDOCAINE-EPINEPHRINE 2 %-1:100000 IJ SOLN
20.0000 mL | Freq: Once | INTRAMUSCULAR | Status: DC
  Filled 2014-01-14: qty 1

## 2014-01-14 MED ORDER — HYDROCODONE-ACETAMINOPHEN 5-325 MG PO TABS: 1.0000 | ORAL_TABLET | ORAL | Status: DC | PRN

## 2014-01-14 MED ORDER — SULFAMETHOXAZOLE-TRIMETHOPRIM 800-160 MG PO TABS
1.0000 | ORAL_TABLET | Freq: Two times a day (BID) | ORAL | Status: DC
Start: 2014-01-14 — End: 2014-06-04

## 2014-01-14 NOTE — ED Notes (Signed)
Per pt, states she has 2 abscesses under right arm-and one on her right buttocks-noticed them over 2 weeks ago

## 2014-01-14 NOTE — Discharge Instructions (Signed)
Take the prescribed medication as directed. Follow-up with urgent care in 2 days for packing removal and wound check. Return to the ED for new or worsening symptoms.

## 2014-01-14 NOTE — ED Notes (Signed)
Family at bedside. 

## 2014-01-14 NOTE — ED Provider Notes (Signed)
CSN: [id number]     Arrival date & time 01/14/14  1145 History   First MD Initiated Contact with Patient 01/14/14 1206     Chief Complaint  Patient presents with  . Abscess     (Consider location/radiation/quality/duration/timing/severity/associated sxs/prior Treatment) Patient is a 28 y.o. female presenting with abscess. The history is provided by the patient and medical records.  Abscess   This is a 28 year old female with past medical history significant for hypertension, depression, PCO S, presenting to the ED for multiple abscesses. Patient states she has 2 small abscesses under her right arm and one larger abscess on her right buttock, all of which have been there for over 2 weeks.  She notes buttock abscess has become increasingly painful and has increased in size, axillary abscesses seem unchanged.  Denies drainage from any of the sites.  No fever, chills, nausea, vomiting.  No prior hx of MRSA.  Past Medical History  Diagnosis Date  . Asthma 06/07/11  . H/O varicella   . H/O amenorrhea 01/27/04  . Irregular periods/menstrual cycles 05/22/04  . Depression 06/07/11  . Hypertension 06/07/11  . PCOS (polycystic ovarian syndrome) 06/07/11  . Obesity 06/07/11  . Smoker 06/07/11   Past Surgical History  Procedure Laterality Date  . Tonsillectomy  06/07/11  . Wisdom tooth extraction  06/07/11  . Adnoids  06/07/11   No family history on file. History  Substance Use Topics  . Smoking status: Current Every Day Smoker -- 0.50 packs/day    Types: Cigarettes  . Smokeless tobacco: Not on file     Comment: pt states she is try to quite as of now  . Alcohol Use: No   OB History    No data available     Review of Systems  Skin:       Multiple abscesses  All other systems reviewed and are negative.     Allergies  Penicillins; Naproxen; Triamterene; Ultram; and Fentanyl  Home Medications   Prior to Admission medications   Medication Sig Start Date End Date Taking? Authorizing  Provider  albuterol (PROVENTIL HFA;VENTOLIN HFA) 108 (90 BASE) MCG/ACT inhaler Inhale 2 puffs into the lungs every 6 (six) hours as needed. Wheezing   Yes Historical Provider, MD  albuterol (PROVENTIL HFA;VENTOLIN HFA) 108 (90 BASE) MCG/ACT inhaler Inhale 2 puffs into the lungs every 6 (six) hours as needed for wheezing or shortness of breath. 01/01/14  Yes Ankit Kathrynn Humble, MD  albuterol (PROVENTIL) (2.5 MG/3ML) 0.083% nebulizer solution Take 2.5 mg by nebulization every 8 (eight) hours as needed. Shortness of breath   Yes Historical Provider, MD  ALPRAZolam (XANAX) 1 MG tablet Take 1 mg by mouth 3 (three) times daily. Scheduled doses for anxiety   Yes Historical Provider, MD  atenolol-chlorthalidone (TENORETIC) 50-25 MG per tablet Take 1 tablet by mouth every evening.    Yes Historical Provider, MD  diltiazem (CARDIZEM) 120 MG tablet Take 120 mg by mouth at bedtime.    Yes Historical Provider, MD  fenofibrate (TRICOR) 145 MG tablet Take 145 mg by mouth daily.   Yes Historical Provider, MD  haloperidol (HALDOL) 0.5 MG tablet Take 0.5 mg by mouth daily.   Yes Historical Provider, MD  lansoprazole (PREVACID) 30 MG capsule Take 30 mg by mouth daily at 12 noon.   Yes Historical Provider, MD  Liraglutide (VICTOZA) 18 MG/3ML SOPN Inject 18 Units into the skin daily.   Yes Historical Provider, MD  metFORMIN (GLUCOPHAGE) 500 MG tablet Take 500 mg by mouth  daily before breakfast.   Yes Historical Provider, MD  mirtazapine (REMERON) 30 MG tablet Take 30 mg by mouth at bedtime.   Yes Historical Provider, MD  oxyCODONE-acetaminophen (PERCOCET/ROXICET) 5-325 MG per tablet Take 1 tablet by mouth every 8 (eight) hours as needed for severe pain.   Yes Historical Provider, MD  diphenhydrAMINE (BENADRYL) 25 mg capsule Take 1 capsule (25 mg total) by mouth every 6 (six) hours as needed for itching. 01/01/14   Varney Biles, MD  doxycycline (VIBRA-TABS) 100 MG tablet Take 1 tablet (100 mg total) by mouth 2 (two) times  daily. 01/01/14   Varney Biles, MD  predniSONE (DELTASONE) 50 MG tablet Take 1 tablet (50 mg total) by mouth daily. 01/01/14   Ankit Nanavati, MD   BP 140/76 mmHg  Pulse 89  Temp(Src) 97.9 F (36.6 C) (Oral)  Resp 18  SpO2 100%  LMP 12/18/2013   Physical Exam  Constitutional: She is oriented to person, place, and time. She appears well-developed and well-nourished. No distress.  HENT:  Head: Normocephalic and atraumatic.  Mouth/Throat: Oropharynx is clear and moist.  Eyes: Conjunctivae and EOM are normal. Pupils are equal, round, and reactive to light.  Neck: Normal range of motion. Neck supple.  Cardiovascular: Normal rate, regular rhythm and normal heart sounds.   Pulmonary/Chest: Effort normal and breath sounds normal. No respiratory distress. She has no wheezes.  Musculoskeletal: Normal range of motion.  Neurological: She is alert and oriented to person, place, and time.  Skin: Skin is warm and dry. She is not diaphoretic.  Multiple abscesses of skin 2 small abscesses of right axilla, minimally tender without central fluctuance; no erythema, induration, or warmth to touch; multiple adjacent skin tags noted; no drainage Right buttock abscess with surrounding erythema; no induration to suggest cellulitis; central fluctuance noted without active drainage  Psychiatric: She has a normal mood and affect.  Nursing note and vitals reviewed.   ED Course  Procedures (including critical care time)  INCISION AND DRAINAGE Performed by: Larene Pickett Consent: Verbal consent obtained. Risks and benefits: risks, benefits and alternatives were discussed Type: abscess  Body area: right buttock  Anesthesia: local infiltration  Incision was made with a scalpel.  Local anesthetic: lidocaine 1% with epinephrine  Anesthetic total: 5 ml  Complexity: complex Blunt dissection to break up loculations  Drainage: purulent  Drainage amount: large  Packing material: 1/4 in iodoform  gauze  Patient tolerance: Patient tolerated the procedure well with no immediate complications.    Labs Review Labs Reviewed - No data to display  Imaging Review No results found.   EKG Interpretation None      MDM   Final diagnoses:  Abscess of right buttock   28 year old female presenting with multiple abscesses. Patient afebrile, non-toxic.  2 smaller abscesses under her right axilla are minimally tender without fluctuance-- do not feel these need I&D at this time.  Abscess of right buttock is larger, central fluctuance and surrounding erythema noted without induration to suggest cellulitis. I&D was performed, patient tolerated well. Abscess was packed with iodoform gauze and dressing applied. Given multiple abscesses, she'll be started on antibiotics. She is instructed to follow-up with urgent care in 2 days for wound check and packing removal.  Discussed plan with patient, he/she acknowledged understanding and agreed with plan of care.  Return precautions given for new or worsening symptoms.  Larene Pickett, PA-C 01/14/14 [place], MD 01/15/14 [phone]

## 2014-03-11 ENCOUNTER — Emergency Department (HOSPITAL_COMMUNITY)
Admission: EM | Admit: 2014-03-11 | Discharge: 2014-03-11 | Disposition: A | Discharge status: Home / Self Care | Payer: Medicare Other | Attending: Emergency Medicine | Admitting: Emergency Medicine

## 2014-03-11 ENCOUNTER — Encounter (HOSPITAL_COMMUNITY): Payer: Self-pay | Admitting: Family Medicine

## 2014-03-11 DIAGNOSIS — J45909 Unspecified asthma, uncomplicated: Secondary | ICD-10-CM | POA: Insufficient documentation

## 2014-03-11 DIAGNOSIS — Z79891 Long term (current) use of opiate analgesic: Secondary | ICD-10-CM | POA: Diagnosis not present

## 2014-03-11 DIAGNOSIS — F329 Major depressive disorder, single episode, unspecified: Secondary | ICD-10-CM | POA: Insufficient documentation

## 2014-03-11 DIAGNOSIS — Z3202 Encounter for pregnancy test, result negative: Secondary | ICD-10-CM | POA: Diagnosis not present

## 2014-03-11 DIAGNOSIS — Z880 Allergy status to penicillin: Secondary | ICD-10-CM | POA: Diagnosis not present

## 2014-03-11 DIAGNOSIS — R739 Hyperglycemia, unspecified: Secondary | ICD-10-CM

## 2014-03-11 DIAGNOSIS — R112 Nausea with vomiting, unspecified: Secondary | ICD-10-CM

## 2014-03-11 DIAGNOSIS — Z720 Tobacco use: Secondary | ICD-10-CM | POA: Diagnosis not present

## 2014-03-11 DIAGNOSIS — B370 Candidal stomatitis: Secondary | ICD-10-CM | POA: Diagnosis not present

## 2014-03-11 DIAGNOSIS — E1165 Type 2 diabetes mellitus with hyperglycemia: Secondary | ICD-10-CM | POA: Diagnosis not present

## 2014-03-11 DIAGNOSIS — Z792 Long term (current) use of antibiotics: Secondary | ICD-10-CM | POA: Insufficient documentation

## 2014-03-11 DIAGNOSIS — Z79899 Other long term (current) drug therapy: Secondary | ICD-10-CM | POA: Diagnosis not present

## 2014-03-11 DIAGNOSIS — Z8742 Personal history of other diseases of the female genital tract: Secondary | ICD-10-CM | POA: Insufficient documentation

## 2014-03-11 DIAGNOSIS — Z8619 Personal history of other infectious and parasitic diseases: Secondary | ICD-10-CM | POA: Insufficient documentation

## 2014-03-11 DIAGNOSIS — Z7952 Long term (current) use of systemic steroids: Secondary | ICD-10-CM | POA: Diagnosis not present

## 2014-03-11 DIAGNOSIS — E860 Dehydration: Secondary | ICD-10-CM | POA: Insufficient documentation

## 2014-03-11 DIAGNOSIS — K1370 Unspecified lesions of oral mucosa: Secondary | ICD-10-CM | POA: Diagnosis present

## 2014-03-11 LAB — PREGNANCY, URINE: Preg Test, Ur: NEGATIVE

## 2014-03-11 LAB — COMPREHENSIVE METABOLIC PANEL
ALT: 59 U/L — ABNORMAL HIGH (ref 0–35)
ANION GAP: 13 (ref 5–15)
AST: 78 U/L — ABNORMAL HIGH (ref 0–37)
Albumin: 4 g/dL (ref 3.5–5.2)
Alkaline Phosphatase: 63 U/L (ref 39–117)
BUN: 11 mg/dL (ref 6–23)
CHLORIDE: 101 meq/L (ref 96–112)
CO2: 21 mmol/L (ref 19–32)
Calcium: 9.6 mg/dL (ref 8.4–10.5)
Creatinine, Ser: 0.74 mg/dL (ref 0.50–1.10)
GFR calc Af Amer: >90 mL/min (ref >90)
GFR calc non Af Amer: >90 mL/min (ref >90)
GLUCOSE: 322 mg/dL — AB (ref 70–99)
POTASSIUM: 4.5 mmol/L (ref 3.5–5.1)
Sodium: 135 mmol/L (ref 135–145)
Total Bilirubin: 0.7 mg/dL (ref 0.3–1.2)
Total Protein: 7.2 g/dL (ref 6.0–8.3)

## 2014-03-11 LAB — CBC WITH DIFFERENTIAL/PLATELET
Basophils Absolute: 0 10*3/uL (ref 0.0–0.1)
Basophils Relative: 1 % (ref 0–1)
EOS ABS: 0.3 10*3/uL (ref 0.0–0.7)
EOS PCT: 4 % (ref 0–5)
HEMATOCRIT: 43.3 % (ref 36.0–46.0)
Hemoglobin: 15.7 g/dL — ABNORMAL HIGH (ref 12.0–15.0)
LYMPHS ABS: 2.5 10*3/uL (ref 0.7–4.0)
LYMPHS PCT: 33 % (ref 12–46)
MCH: 31.3 pg (ref 26.0–34.0)
MCHC: 36.3 g/dL — ABNORMAL HIGH (ref 30.0–36.0)
MCV: 86.3 fL (ref 78.0–100.0)
MONO ABS: 0.5 10*3/uL (ref 0.1–1.0)
Monocytes Relative: 6 % (ref 3–12)
Neutro Abs: 4.3 10*3/uL (ref 1.7–7.7)
Neutrophils Relative %: 56 % (ref 43–77)
PLATELETS: 246 10*3/uL (ref 150–400)
RBC: 5.02 MIL/uL (ref 3.87–5.11)
RDW: 12.6 % (ref 11.5–15.5)
WBC: 7.7 10*3/uL (ref 4.0–10.5)

## 2014-03-11 LAB — URINE MICROSCOPIC-ADD ON

## 2014-03-11 LAB — URINALYSIS, ROUTINE W REFLEX MICROSCOPIC
BILIRUBIN URINE: NEGATIVE
Glucose, UA: >1000 mg/dL — AB
Hgb urine dipstick: NEGATIVE
KETONES UR: NEGATIVE mg/dL
LEUKOCYTES UA: NEGATIVE
Nitrite: NEGATIVE
PH: 5.5 (ref 5.0–8.0)
PROTEIN: NEGATIVE mg/dL
Specific Gravity, Urine: 1.038 — ABNORMAL HIGH (ref 1.005–1.030)
Urobilinogen, UA: 0.2 mg/dL (ref 0.0–1.0)

## 2014-03-11 LAB — CBG MONITORING, ED: Glucose-Capillary: 242 mg/dL — ABNORMAL HIGH (ref 70–99)

## 2014-03-11 LAB — POC URINE PREG, ED: PREG TEST UR: NEGATIVE

## 2014-03-11 MED ORDER — CANAGLIFLOZIN 100 MG PO TABS: ORAL_TABLET | ORAL | Status: DC

## 2014-03-11 MED ORDER — ONDANSETRON HCL 4 MG/2ML IJ SOLN
4.0000 mg | Freq: Once | INTRAMUSCULAR | Status: AC
  Administered 2014-03-11: 4 mg via INTRAVENOUS
  Filled 2014-03-11: qty 2

## 2014-03-11 MED ORDER — MAGIC MOUTHWASH W/LIDOCAINE: 5.0000 mL | Freq: Four times a day (QID) | ORAL | Status: DC | PRN

## 2014-03-11 MED ORDER — NYSTATIN 100000 UNIT/ML MT SUSP: 500000.0000 [IU] | Freq: Four times a day (QID) | OROMUCOSAL | Status: DC

## 2014-03-11 MED ORDER — LIDOCAINE VISCOUS 2 % MT SOLN
15.0000 mL | Freq: Once | OROMUCOSAL | Status: AC
  Administered 2014-03-11: 15 mL via OROMUCOSAL
  Filled 2014-03-11: qty 15

## 2014-03-11 MED ORDER — SODIUM CHLORIDE 0.9 % IV BOLUS (SEPSIS)
1000.0000 mL | Freq: Once | INTRAVENOUS | Status: AC
  Administered 2014-03-11: 1000 mL via INTRAVENOUS

## 2014-03-11 NOTE — Discharge Instructions (Signed)
Hyperglycemia °Hyperglycemia occurs when the glucose (sugar) in your blood is too high. Hyperglycemia can happen for many reasons, but it most often happens to people who do not know they have diabetes or are not managing their diabetes properly.  °CAUSES  °Whether you have diabetes or not, there are other causes of hyperglycemia. Hyperglycemia can occur when you have diabetes, but it can also occur in other situations that you might not be as aware of, such as: °Diabetes °· If you have diabetes and are having problems controlling your blood glucose, hyperglycemia could occur because of some of the following reasons: °· Not following your meal plan. °· Not taking your diabetes medications or not taking it properly. °· Exercising less or doing less activity than you normally do. °· Being sick. °Pre-diabetes °· This cannot be ignored. Before people develop Type 2 diabetes, they almost always have "pre-diabetes." This is when your blood glucose levels are higher than normal, but not yet high enough to be diagnosed as diabetes. Research has shown that some long-term damage to the body, especially the heart and circulatory system, may already be occurring during pre-diabetes. If you take action to manage your blood glucose when you have pre-diabetes, you may delay or prevent Type 2 diabetes from developing. °Stress °· If you have diabetes, you may be "diet" controlled or on oral medications or insulin to control your diabetes. However, you may find that your blood glucose is higher than usual in the hospital whether you have diabetes or not. This is often referred to as "stress hyperglycemia." Stress can elevate your blood glucose. This happens because of hormones put out by the body during times of stress. If stress has been the cause of your high blood glucose, it can be followed regularly by your caregiver. That way he/she can make sure your hyperglycemia does not continue to get worse or progress to  diabetes. °Steroids °· Steroids are medications that act on the infection fighting system (immune system) to block inflammation or infection. One side effect can be a rise in blood glucose. Most people can produce enough extra insulin to allow for this rise, but for those who cannot, steroids make blood glucose levels go even higher. It is not unusual for steroid treatments to "uncover" diabetes that is developing. It is not always possible to determine if the hyperglycemia will go away after the steroids are stopped. A special blood test called an A1c is sometimes done to determine if your blood glucose was elevated before the steroids were started. °SYMPTOMS °· Thirsty. °· Frequent urination. °· Dry mouth. °· Blurred vision. °· Tired or fatigue. °· Weakness. °· Sleepy. °· Tingling in feet or leg. °DIAGNOSIS  °Diagnosis is made by monitoring blood glucose in one or all of the following ways: °· A1c test. This is a chemical found in your blood. °· Fingerstick blood glucose monitoring. °· Laboratory results. °TREATMENT  °First, knowing the cause of the hyperglycemia is important before the hyperglycemia can be treated. Treatment may include, but is not be limited to: °· Education. °· Change or adjustment in medications. °· Change or adjustment in meal plan. °· Treatment for an illness, infection, etc. °· More frequent blood glucose monitoring. °· Change in exercise plan. °· Decreasing or stopping steroids. °· Lifestyle changes. °HOME CARE INSTRUCTIONS  °· Test your blood glucose as directed. °· Exercise regularly. Your caregiver will give you instructions about exercise. Pre-diabetes or diabetes which comes on with stress is helped by exercising. °· Eat wholesome,   balanced meals. Eat often and at regular, fixed times. Your caregiver or nutritionist will give you a meal plan to guide your sugar intake.  Being at an ideal weight is important. If needed, losing as little as 10 to 15 pounds may help improve blood  glucose levels. SEEK MEDICAL CARE IF:   You have questions about medicine, activity, or diet.  You continue to have symptoms (problems such as increased thirst, urination, or weight gain). SEEK IMMEDIATE MEDICAL CARE IF:   You are vomiting or have diarrhea.  Your breath smells fruity.  You are breathing faster or slower.  You are very sleepy or incoherent.  You have numbness, tingling, or pain in your feet or hands.  You have chest pain.  Your symptoms get worse even though you have been following your caregiver's orders.  If you have any other questions or concerns. Document Released: 08/22/2000 Document Revised: 05/21/2011 Document Reviewed: 06/25/2011 [facility] Patient Information 2015 [place], [place]. This information is not intended to replace advice given to you by your health care provider. Make sure you discuss any questions you have with your health care provider.  Nausea and Vomiting Nausea is a sick feeling that often comes before throwing up (vomiting). Vomiting is a reflex where stomach contents come out of your mouth. Vomiting can cause severe loss of body fluids (dehydration). Children and elderly adults can become dehydrated quickly, especially if they also have diarrhea. Nausea and vomiting are symptoms of a condition or disease. It is important to find the cause of your symptoms. CAUSES   Direct irritation of the stomach lining. This irritation can result from increased acid production (gastroesophageal reflux disease), infection, food poisoning, taking certain medicines (such as nonsteroidal anti-inflammatory drugs), alcohol use, or tobacco use.  Signals from the brain.These signals could be caused by a headache, heat exposure, an inner ear disturbance, increased pressure in the brain from injury, infection, a tumor, or a concussion, pain, emotional stimulus, or metabolic problems.  An obstruction in the gastrointestinal tract (bowel obstruction).  Illnesses  such as diabetes, hepatitis, gallbladder problems, appendicitis, kidney problems, cancer, sepsis, atypical symptoms of a heart attack, or eating disorders.  Medical treatments such as chemotherapy and radiation.  Receiving medicine that makes you sleep (general anesthetic) during surgery. DIAGNOSIS Your caregiver may ask for tests to be done if the problems do not improve after a few days. Tests may also be done if symptoms are severe or if the reason for the nausea and vomiting is not clear. Tests may include:  Urine tests.  Blood tests.  Stool tests.  Cultures (to look for evidence of infection).  X-rays or other imaging studies. Test results can help your caregiver make decisions about treatment or the need for additional tests. TREATMENT You need to stay well hydrated. Drink frequently but in small amounts.You may wish to drink water, sports drinks, clear broth, or eat frozen ice pops or gelatin dessert to help stay hydrated.When you eat, eating slowly may help prevent nausea.There are also some antinausea medicines that may help prevent nausea. HOME CARE INSTRUCTIONS   Take all medicine as directed by your caregiver.  If you do not have an appetite, do not force yourself to eat. However, you must continue to drink fluids.  If you have an appetite, eat a normal diet unless your caregiver tells you differently.  Eat a variety of complex carbohydrates (rice, wheat, potatoes, bread), lean meats, yogurt, fruits, and vegetables.  Avoid high-fat foods because they are more difficult  to digest.  Drink enough water and fluids to keep your urine clear or pale yellow.  If you are dehydrated, ask your caregiver for specific rehydration instructions. Signs of dehydration may include:  Severe thirst.  Dry lips and mouth.  Dizziness.  Dark urine.  Decreasing urine frequency and amount.  Confusion.  Rapid breathing or pulse. SEEK IMMEDIATE MEDICAL CARE IF:   You have blood  or brown flecks (like coffee grounds) in your vomit.  You have black or bloody stools.  You have a severe headache or stiff neck.  You are confused.  You have severe abdominal pain.  You have chest pain or trouble breathing.  You do not urinate at least once every 8 hours.  You develop cold or clammy skin.  You continue to vomit for longer than 24 to 48 hours.  You have a fever. MAKE SURE YOU:   Understand these instructions.  Will watch your condition.  Will get help right away if you are not doing well or get worse. Document Released: 02/26/2005 Document Revised: 05/21/2011 Document Reviewed: 07/26/2010 [facility] Patient Information 2015 [place], [place]. This information is not intended to replace advice given to you by your health care provider. Make sure you discuss any questions you have with your health care provider.  Thrush, Adult  Ritta Slot, also called oral candidiasis, is a fungal infection that develops in the mouth and throat and on the tongue. It causes white patches to form on the mouth and tongue. Ritta Slot is most common in older adults, but it can occur at any age.  Many cases of thrush are mild, but this infection can also be more serious. Ritta Slot can be a recurring problem for people who have chronic illnesses or who take medicines that limit the body's ability to fight infection. Because these people have difficulty fighting infections, the fungus that causes thrush can spread throughout the body. This can cause life-threatening blood or organ infections. CAUSES  Ritta Slot is usually caused by a yeast called Candida albicans. This fungus is normally present in small amounts in the mouth and on other mucous membranes. It usually causes no harm. However, when conditions are present that allow the fungus to grow uncontrolled, it invades surrounding tissues and becomes an infection. Less often, other Candida species can also lead to thrush.  RISK FACTORS Ritta Slot is more likely  to develop in the following people:  People with an impaired ability to fight infection (weakened immune system).   Older adults.   People with HIV.   People with diabetes.   People with dry mouth (xerostomia).   Pregnant women.   People with poor dental care, especially those who have false teeth.   People who use antibiotic medicines.  SIGNS AND SYMPTOMS  Ritta Slot can be a mild infection that causes no symptoms. If symptoms develop, they may include:   A burning feeling in the mouth and throat. This can occur at the start of a thrush infection.   White patches that adhere to the mouth and tongue. The tissue around the patches may be red, raw, and painful. If rubbed (during tooth brushing, for example), the patches and the tissue of the mouth may bleed easily.   A bad taste in the mouth or difficulty tasting foods.   Cottony feeling in the mouth.   Pain during eating and swallowing. DIAGNOSIS  Your health care provider can usually diagnose thrush by looking in your mouth and asking you questions about your health.  TREATMENT  Medicines  that help prevent the growth of fungi (antifungals) are the standard treatment for thrush. These medicines are either applied directly to the affected area (topical) or swallowed (oral). The treatment will depend on the severity of the condition.  Mild Thrush Mild cases of thrush may clear up with the use of an antifungal mouth rinse or lozenges. Treatment usually lasts about 14 days.  Moderate to Severe Thrush  More severe thrush infections that have spread to the esophagus are treated with an oral antifungal medicine. A topical antifungal medicine may also be used.   For some severe infections, a treatment period longer than 14 days may be needed.   Oral antifungal medicines are almost never used during pregnancy because the fetus may be harmed. However, if a pregnant woman has a rare, severe thrush infection that has spread to  her blood, oral antifungal medicines may be used. In this case, the risk of harm to the mother and fetus from the severe thrush infection may be greater than the risk posed by the use of antifungal medicines.  Persistent or Recurrent Thrush For cases of thrush that do not go away or keep coming back, treatment may involve the following:   Treatment may be needed twice as long as the symptoms last.   Treatment will include both oral and topical antifungal medicines.   People with weakened immune systems can take an antifungal medicine on a continuous basis to prevent thrush infections.  It is important to treat conditions that make you more likely to get thrush, such as diabetes or HIV.  HOME CARE INSTRUCTIONS   Only take over-the-counter or prescription medicine as directed by your health care provider. Talk to your health care provider about an over-the-counter medicine called gentian violet, which kills bacteria and fungi.   Eat plain, unflavored yogurt as directed by your health care provider. Check the label to make sure the yogurt contains live cultures. This yogurt can help healthy bacteria grow in the mouth that can stop the growth of the fungus that causes thrush.   Try these measures to help reduce the discomfort of thrush:   Drink cold liquids such as water or iced tea.   Try flavored ice treats or frozen juices.   Eat foods that are easy to swallow, such as gelatin, ice cream, or custard.   If the patches in your mouth are painful, try drinking from a straw.   Rinse your mouth several times a day with a warm saltwater rinse. You can make the saltwater mixture with 1 tsp (6 g) of salt in 8 fl oz (0.2 L) of warm water.   If you wear dentures, remove the dentures before going to bed, brush them vigorously, and soak them in a cleaning solution as directed by your health care provider.   Women who are breastfeeding should clean their nipples with an antifungal  medicine as directed by their health care provider. Dry the nipples after breastfeeding. Applying lanolin-containing body lotion may help relieve nipple soreness.  SEEK MEDICAL CARE IF:  Your symptoms are getting worse or are not improving within 7 days of starting treatment.   You have symptoms of spreading infection, such as white patches on the skin outside of the mouth.   You are nursing and you have redness, burning, or pain in the nipples that is not relieved with treatment.  MAKE SURE YOU:  Understand these instructions.  Will watch your condition.  Will get help right away if you are not  doing well or get worse. Document Released: 11/22/2003 Document Revised: 12/17/2012 Document Reviewed: 09/29/2012 [facility] Patient Information 2015 [place], [place]. This information is not intended to replace advice given to you by your health care provider. Make sure you discuss any questions you have with your health care provider.

## 2014-03-11 NOTE — ED Notes (Signed)
Per pt sts rash in her mouth, urinary frequency and vomiting. sts x 2 weeks.

## 2014-03-11 NOTE — ED Provider Notes (Signed)
CSN: [id number]     Arrival date & time 03/11/14  1138 History   First MD Initiated Contact with Patient 03/11/14 1306     Chief Complaint  Patient presents with  . Mouth Lesions  . Emesis     (Consider location/radiation/quality/duration/timing/severity/associated sxs/prior Treatment) HPI   This is a 28 year old female with a past medical history of morbid obesity, PCOS,, and diabetes. The patient presents complaining of daily nausea and vomiting, which is worse in the morning. She has associated breast tenderness and abdominal pain. She denies diarrhea. She states that her symptoms have been going on for about 2-1/2 weeks. She is newly married and she states she is trying to get pregnant. The patient also complains of burning tongue Her pain is worse with ingestion of hot and cold liquids as well as spicy foods. She denies any  Past Medical History  Diagnosis Date  . Asthma 06/07/11  . H/O varicella   . H/O amenorrhea 01/27/04  . Irregular periods/menstrual cycles 05/22/04  . Depression 06/07/11  . Hypertension 06/07/11  . PCOS (polycystic ovarian syndrome) 06/07/11  . Obesity 06/07/11  . Smoker 06/07/11   Past Surgical History  Procedure Laterality Date  . Tonsillectomy  06/07/11  . Wisdom tooth extraction  06/07/11  . Adnoids  06/07/11   History reviewed. No pertinent family history. History  Substance Use Topics  . Smoking status: Current Every Day Smoker -- 0.50 packs/day    Types: Cigarettes  . Smokeless tobacco: Not on file     Comment: pt states she is try to quite as of now  . Alcohol Use: No   OB History    No data available     Review of Systems    Allergies  Penicillins; Naproxen; Triamterene; Ultram; and Fentanyl  Home Medications   Prior to Admission medications   Medication Sig Start Date End Date Taking? Authorizing Provider  albuterol (PROVENTIL HFA;VENTOLIN HFA) 108 (90 BASE) MCG/ACT inhaler Inhale 2 puffs into the lungs every 6 (six) hours as  needed. Wheezing    Historical Provider, MD  albuterol (PROVENTIL HFA;VENTOLIN HFA) 108 (90 BASE) MCG/ACT inhaler Inhale 2 puffs into the lungs every 6 (six) hours as needed for wheezing or shortness of breath. 01/01/14   Varney Biles, MD  albuterol (PROVENTIL) (2.5 MG/3ML) 0.083% nebulizer solution Take 2.5 mg by nebulization every 8 (eight) hours as needed. Shortness of breath    Historical Provider, MD  ALPRAZolam Duanne Moron) 1 MG tablet Take 1 mg by mouth 3 (three) times daily. Scheduled doses for anxiety    Historical Provider, MD  atenolol-chlorthalidone (TENORETIC) 50-25 MG per tablet Take 1 tablet by mouth every evening.     Historical Provider, MD  diltiazem (CARDIZEM) 120 MG tablet Take 120 mg by mouth at bedtime.     Historical Provider, MD  diphenhydrAMINE (BENADRYL) 25 mg capsule Take 1 capsule (25 mg total) by mouth every 6 (six) hours as needed for itching. 01/01/14   Varney Biles, MD  doxycycline (VIBRA-TABS) 100 MG tablet Take 1 tablet (100 mg total) by mouth 2 (two) times daily. 01/01/14   Varney Biles, MD  fenofibrate (TRICOR) 145 MG tablet Take 145 mg by mouth daily.    Historical Provider, MD  haloperidol (HALDOL) 0.5 MG tablet Take 0.5 mg by mouth daily.    Historical Provider, MD  HYDROcodone-acetaminophen (NORCO/VICODIN) 5-325 MG per tablet Take 1 tablet by mouth every 4 (four) hours as needed. 01/14/14   Larene Pickett, PA-C  lansoprazole (PREVACID)  30 MG capsule Take 30 mg by mouth daily at 12 noon.    Historical Provider, MD  Liraglutide (VICTOZA) 18 MG/3ML SOPN Inject 18 Units into the skin daily.    Historical Provider, MD  metFORMIN (GLUCOPHAGE) 500 MG tablet Take 500 mg by mouth daily before breakfast.    Historical Provider, MD  mirtazapine (REMERON) 30 MG tablet Take 30 mg by mouth at bedtime.    Historical Provider, MD  oxyCODONE-acetaminophen (PERCOCET/ROXICET) 5-325 MG per tablet Take 1 tablet by mouth every 8 (eight) hours as needed for severe pain.    Historical  Provider, MD  predniSONE (DELTASONE) 50 MG tablet Take 1 tablet (50 mg total) by mouth daily. 01/01/14   Varney Biles, MD  sulfamethoxazole-trimethoprim (SEPTRA DS) 800-160 MG per tablet Take 1 tablet by mouth every 12 (twelve) hours. 01/14/14   Larene Pickett, PA-C   BP 140/78 mmHg  Pulse 106  Temp(Src) 97.8 F (36.6 C) (Oral)  Resp 18  SpO2 95%  LMP 12/10/2013 Physical Exam  Constitutional: She is oriented to person, place, and time. She appears well-developed and well-nourished. No distress.  HENT:  Head: Normocephalic and atraumatic.  Post tongue with loss of normal roughened surface. No white patches.   Eyes: Conjunctivae are normal. No scleral icterus.  Neck: Normal range of motion.  Cardiovascular: Normal rate, regular rhythm and normal heart sounds.  Exam reveals no gallop and no friction rub.   No murmur heard. Pulmonary/Chest: Effort normal and breath sounds normal. No respiratory distress.  Abdominal: Soft. Bowel sounds are normal. She exhibits no distension and no mass. There is no tenderness. There is no guarding.  Neurological: She is alert and oriented to person, place, and time.  Skin: Skin is warm and dry. She is not diaphoretic.  Nursing note and vitals reviewed.   ED Course  Procedures (including critical care time) Labs Review Labs Reviewed  CBC WITH DIFFERENTIAL - Abnormal; Notable for the following:    Hemoglobin 15.7 (*)    MCHC 36.3 (*)    All other components within normal limits  COMPREHENSIVE METABOLIC PANEL - Abnormal; Notable for the following:    Glucose, Bld 322 (*)    AST 78 (*)    ALT 59 (*)    All other components within normal limits  URINALYSIS, ROUTINE W REFLEX MICROSCOPIC  PREGNANCY, URINE  POC URINE PREG, ED    Imaging Review No results found.   EKG Interpretation None      MDM   Final diagnoses:  Hyperglycemia  Thrush  Non-intractable vomiting with nausea, vomiting of unspecified type    Patient with recent  diabeticmedication change, blood sugar is elevated.  Elevated hgb, +glucosuria and elevated specific gracity indicating dehydration. I believe the patient's sxs are directly related to her elevated blood sugars. Will give IV fluids, recheck sugar.    Patient blood sugar decreased. D/c with invokana and stop metformin. Follow closely with pcp.     Margarita Mail, PA-C 03/14/14 2106  Tanna Furry, MD 03/24/14 [phone]

## 2014-03-17 ENCOUNTER — Emergency Department (HOSPITAL_COMMUNITY)
Admission: EM | Admit: 2014-03-17 | Discharge: 2014-03-17 | Disposition: A | Discharge status: Home / Self Care | Payer: Medicare Other | Attending: Emergency Medicine | Admitting: Emergency Medicine

## 2014-03-17 ENCOUNTER — Encounter (HOSPITAL_COMMUNITY): Payer: Self-pay | Admitting: *Deleted

## 2014-03-17 ENCOUNTER — Emergency Department (HOSPITAL_COMMUNITY): Payer: Medicare Other

## 2014-03-17 DIAGNOSIS — R05 Cough: Secondary | ICD-10-CM | POA: Insufficient documentation

## 2014-03-17 DIAGNOSIS — N739 Female pelvic inflammatory disease, unspecified: Secondary | ICD-10-CM | POA: Insufficient documentation

## 2014-03-17 DIAGNOSIS — Z79899 Other long term (current) drug therapy: Secondary | ICD-10-CM | POA: Insufficient documentation

## 2014-03-17 DIAGNOSIS — Z8619 Personal history of other infectious and parasitic diseases: Secondary | ICD-10-CM | POA: Diagnosis not present

## 2014-03-17 DIAGNOSIS — F329 Major depressive disorder, single episode, unspecified: Secondary | ICD-10-CM | POA: Insufficient documentation

## 2014-03-17 DIAGNOSIS — J449 Chronic obstructive pulmonary disease, unspecified: Secondary | ICD-10-CM | POA: Diagnosis not present

## 2014-03-17 DIAGNOSIS — Z720 Tobacco use: Secondary | ICD-10-CM | POA: Insufficient documentation

## 2014-03-17 DIAGNOSIS — Z880 Allergy status to penicillin: Secondary | ICD-10-CM | POA: Diagnosis not present

## 2014-03-17 DIAGNOSIS — N899 Noninflammatory disorder of vagina, unspecified: Secondary | ICD-10-CM | POA: Diagnosis not present

## 2014-03-17 DIAGNOSIS — E669 Obesity, unspecified: Secondary | ICD-10-CM | POA: Insufficient documentation

## 2014-03-17 DIAGNOSIS — N898 Other specified noninflammatory disorders of vagina: Secondary | ICD-10-CM | POA: Diagnosis present

## 2014-03-17 DIAGNOSIS — I10 Essential (primary) hypertension: Secondary | ICD-10-CM | POA: Diagnosis not present

## 2014-03-17 DIAGNOSIS — R059 Cough, unspecified: Secondary | ICD-10-CM

## 2014-03-17 DIAGNOSIS — Z3202 Encounter for pregnancy test, result negative: Secondary | ICD-10-CM | POA: Diagnosis not present

## 2014-03-17 DIAGNOSIS — N730 Acute parametritis and pelvic cellulitis: Secondary | ICD-10-CM

## 2014-03-17 LAB — URINALYSIS, ROUTINE W REFLEX MICROSCOPIC
BILIRUBIN URINE: NEGATIVE
Glucose, UA: >1000 mg/dL — AB
Hgb urine dipstick: NEGATIVE
KETONES UR: 15 mg/dL — AB
Nitrite: NEGATIVE
PH: 5.5 (ref 5.0–8.0)
PROTEIN: NEGATIVE mg/dL
SPECIFIC GRAVITY, URINE: 1.034 — AB (ref 1.005–1.030)
UROBILINOGEN UA: 0.2 mg/dL (ref 0.0–1.0)

## 2014-03-17 LAB — CBC WITH DIFFERENTIAL/PLATELET
BASOS ABS: 0.1 10*3/uL (ref 0.0–0.1)
Basophils Relative: 1 % (ref 0–1)
Eosinophils Absolute: 0.3 10*3/uL (ref 0.0–0.7)
Eosinophils Relative: 4 % (ref 0–5)
HCT: 42.3 % (ref 36.0–46.0)
Hemoglobin: 15.2 g/dL — ABNORMAL HIGH (ref 12.0–15.0)
Lymphocytes Relative: 34 % (ref 12–46)
Lymphs Abs: 3 10*3/uL (ref 0.7–4.0)
MCH: 31.2 pg (ref 26.0–34.0)
MCHC: 35.9 g/dL (ref 30.0–36.0)
MCV: 86.9 fL (ref 78.0–100.0)
MONOS PCT: 6 % (ref 3–12)
Monocytes Absolute: 0.6 10*3/uL (ref 0.1–1.0)
Neutro Abs: 4.8 10*3/uL (ref 1.7–7.7)
Neutrophils Relative %: 55 % (ref 43–77)
Platelets: 257 10*3/uL (ref 150–400)
RBC: 4.87 MIL/uL (ref 3.87–5.11)
RDW: 12.7 % (ref 11.5–15.5)
WBC: 8.7 10*3/uL (ref 4.0–10.5)

## 2014-03-17 LAB — COMPREHENSIVE METABOLIC PANEL
ALBUMIN: 4.1 g/dL (ref 3.5–5.2)
ALT: 57 U/L — AB (ref 0–35)
AST: 69 U/L — ABNORMAL HIGH (ref 0–37)
Alkaline Phosphatase: 60 U/L (ref 39–117)
Anion gap: 14 (ref 5–15)
BUN: 11 mg/dL (ref 6–23)
CALCIUM: 9.6 mg/dL (ref 8.4–10.5)
CHLORIDE: 103 meq/L (ref 96–112)
CO2: 17 mmol/L — ABNORMAL LOW (ref 19–32)
CREATININE: 0.64 mg/dL (ref 0.50–1.10)
GFR calc Af Amer: >90 mL/min (ref >90)
GFR calc non Af Amer: >90 mL/min (ref >90)
GLUCOSE: 263 mg/dL — AB (ref 70–99)
Potassium: 4.2 mmol/L (ref 3.5–5.1)
Sodium: 134 mmol/L — ABNORMAL LOW (ref 135–145)
Total Bilirubin: 0.8 mg/dL (ref 0.3–1.2)
Total Protein: 7.1 g/dL (ref 6.0–8.3)

## 2014-03-17 LAB — CBG MONITORING, ED: Glucose-Capillary: 247 mg/dL — ABNORMAL HIGH (ref 70–99)

## 2014-03-17 LAB — WET PREP, GENITAL
Clue Cells Wet Prep HPF POC: NONE SEEN
Trich, Wet Prep: NONE SEEN
Yeast Wet Prep HPF POC: NONE SEEN

## 2014-03-17 LAB — URINE MICROSCOPIC-ADD ON

## 2014-03-17 LAB — POC URINE PREG, ED: Preg Test, Ur: NEGATIVE

## 2014-03-17 LAB — LIPASE, BLOOD: LIPASE: 38 U/L (ref 11–59)

## 2014-03-17 MED ORDER — ALBUTEROL SULFATE HFA 108 (90 BASE) MCG/ACT IN AERS
4.0000 | INHALATION_SPRAY | Freq: Once | RESPIRATORY_TRACT | Status: AC
  Administered 2014-03-17: 4 via RESPIRATORY_TRACT
  Filled 2014-03-17: qty 6.7

## 2014-03-17 MED ORDER — DEXTROSE 5 % IV SOLN
250.0000 mg | Freq: Once | INTRAVENOUS | Status: AC
  Administered 2014-03-17: 250 mg via INTRAVENOUS
  Filled 2014-03-17 (×2): qty 250

## 2014-03-17 MED ORDER — HYDROCODONE-ACETAMINOPHEN 5-325 MG PO TABS
2.0000 | ORAL_TABLET | Freq: Once | ORAL | Status: AC
  Administered 2014-03-17: 2 via ORAL
  Filled 2014-03-17: qty 2

## 2014-03-17 MED ORDER — DOXYCYCLINE HYCLATE 100 MG PO CAPS: 100.0000 mg | ORAL_CAPSULE | Freq: Two times a day (BID) | ORAL | Status: AC

## 2014-03-17 MED ORDER — AZITHROMYCIN 250 MG PO TABS
1000.0000 mg | ORAL_TABLET | Freq: Once | ORAL | Status: AC
  Administered 2014-03-17: 1000 mg via ORAL
  Filled 2014-03-17: qty 4

## 2014-03-17 MED ORDER — SODIUM CHLORIDE 0.9 % IV BOLUS (SEPSIS)
500.0000 mL | Freq: Once | INTRAVENOUS | Status: AC
  Administered 2014-03-17: 500 mL via INTRAVENOUS

## 2014-03-17 MED ORDER — ONDANSETRON HCL 4 MG/2ML IJ SOLN
4.0000 mg | Freq: Once | INTRAMUSCULAR | Status: AC
  Administered 2014-03-17: 4 mg via INTRAVENOUS
  Filled 2014-03-17: qty 2

## 2014-03-17 NOTE — ED Provider Notes (Signed)
I saw and evaluated the patient, reviewed the resident's note and I agree with the findings and plan.   EKG Interpretation None      29 year old female presenting with multiple complaints including sore throat, 2 weeks of abdominal pain, vaginal discharge.  On exam, well appearing, nontoxic, not distressed, normal respiratory effort, normal perfusion, oropharynx clear without evidence of candidal infection, abdomen soft and minimally tender in suprapubic region.  She does not appear ill or toxic. She appears stable for outpatient treatment and follow-up.   Clinical Impression: 1. Vaginal dryness   2. Cough   3. PID (acute pelvic inflammatory disease)       Houston Siren III, MD 03/18/14 0005

## 2014-03-17 NOTE — ED Notes (Signed)
Pt in c/o cough and congestion, continued thrush which she was seen for recently, also vaginal discharge, no distress noted

## 2014-03-17 NOTE — ED Provider Notes (Signed)
CSN: [id number]     Arrival date & time 03/17/14  1529 History   First MD Initiated Contact with Patient 03/17/14 1644     Chief Complaint  Patient presents with  . Thrush  . Vaginal Discharge  . Cough     (Consider location/radiation/quality/duration/timing/severity/associated sxs/prior Treatment) Patient is a 29 y.o. female presenting with cough and female genitourinary complaint.  Cough Associated symptoms: fever (103 this AM), headaches and sore throat   Associated symptoms: no chest pain, no rash and no shortness of breath   Female GU Problem This is a new problem. Episode onset: 2wk. The problem occurs constantly. The problem has been unchanged. Associated symptoms include abdominal pain (lower abdomen x3wk), coughing (6 days, productive green/brown.  similar to past bronchitis), a fever (103 this AM), headaches, nausea (83mo), a sore throat and vomiting (after coughing or during). Pertinent negatives include no chest pain, congestion, neck pain, numbness or rash. Associated symptoms comments: Thrush of tongue, not improving 1.5 ago started nystatin. Nothing aggravates the symptoms. She has tried nothing for the symptoms.    Past Medical History  Diagnosis Date  . Asthma 06/07/11  . H/O varicella   . H/O amenorrhea 01/27/04  . Irregular periods/menstrual cycles 05/22/04  . Depression 06/07/11  . Hypertension 06/07/11  . PCOS (polycystic ovarian syndrome) 06/07/11  . Obesity 06/07/11  . Smoker 06/07/11   Past Surgical History  Procedure Laterality Date  . Tonsillectomy  06/07/11  . Wisdom tooth extraction  06/07/11  . Adnoids  06/07/11   History reviewed. No pertinent family history. History  Substance Use Topics  . Smoking status: Current Every Day Smoker -- 0.50 packs/day    Types: Cigarettes  . Smokeless tobacco: Not on file     Comment: pt states she is try to quite as of now  . Alcohol Use: No   OB History    No data available     Review of Systems  Constitutional:  Positive for fever (103 this AM).  HENT: Positive for sore throat. Negative for congestion.   Eyes: Negative for visual disturbance.  Respiratory: Positive for cough (6 days, productive green/brown.  similar to past bronchitis). Negative for shortness of breath.   Cardiovascular: Negative for chest pain.  Gastrointestinal: Positive for nausea (18mo), vomiting (after coughing or during) and abdominal pain (lower abdomen x3wk). Negative for diarrhea, constipation and blood in stool.  Genitourinary: Positive for dysuria (when having pruritis). Negative for hematuria and difficulty urinating.  Musculoskeletal: Negative for back pain and neck pain.  Skin: Negative for rash.  Neurological: Positive for headaches. Negative for syncope and numbness.      Allergies  Penicillins; Naproxen; Triamterene; Ultram; and Fentanyl  Home Medications   Prior to Admission medications   Medication Sig Start Date End Date Taking? Authorizing Provider  albuterol (PROVENTIL HFA;VENTOLIN HFA) 108 (90 BASE) MCG/ACT inhaler Inhale 2 puffs into the lungs every 6 (six) hours as needed for wheezing or shortness of breath. 01/01/14  Yes Ankit Kathrynn Humble, MD  albuterol (PROVENTIL) (2.5 MG/3ML) 0.083% nebulizer solution Take 2.5 mg by nebulization every 8 (eight) hours as needed. Shortness of breath   Yes Historical Provider, MD  ALPRAZolam (XANAX) 1 MG tablet Take 1 mg by mouth 3 (three) times daily. Scheduled doses for anxiety   Yes Historical Provider, MD  atenolol (TENORMIN) 50 MG tablet Take 50 mg by mouth every evening.  01/20/14  Yes Historical Provider, MD  canagliflozin (INVOKANA) 100 MG TABS tablet Take 0.5 tablets (50 mg  total) by mouth twice daily. 03/11/14  Yes Margarita Mail, PA-C  diltiazem (CARDIZEM CD) 120 MG 24 hr capsule Take 120 mg by mouth daily. 01/25/14  Yes Historical Provider, MD  fenofibrate (TRICOR) 145 MG tablet Take 145 mg by mouth daily.   Yes Historical Provider, MD  haloperidol (HALDOL) 0.5 MG  tablet Take 0.5 mg by mouth daily.   Yes Historical Provider, MD  lansoprazole (PREVACID) 30 MG capsule Take 30 mg by mouth daily at 12 noon.   Yes Historical Provider, MD  Liraglutide (VICTOZA) 18 MG/3ML SOPN Inject 1.8 mg into the skin daily.    Yes Historical Provider, MD  mirtazapine (REMERON) 30 MG tablet Take 30 mg by mouth at bedtime.   Yes Historical Provider, MD  oxyCODONE-acetaminophen (PERCOCET/ROXICET) 5-325 MG per tablet Take 1 tablet by mouth every 8 (eight) hours as needed for severe pain.   Yes Historical Provider, MD  Alum & Mag Hydroxide-Simeth (MAGIC MOUTHWASH W/LIDOCAINE) SOLN Take 5 mLs by mouth 4 (four) times daily as needed for mouth pain. Patient not taking: Reported on 03/17/2014 03/11/14   Margarita Mail, PA-C  carvedilol (COREG) 6.25 MG tablet Take 6.25 mg by mouth 2 (two) times daily as needed (fast pulse).  01/21/14   Historical Provider, MD  diphenhydrAMINE (BENADRYL) 25 mg capsule Take 1 capsule (25 mg total) by mouth every 6 (six) hours as needed for itching. Patient not taking: Reported on 03/17/2014 01/01/14   Varney Biles, MD  doxycycline (VIBRAMYCIN) 100 MG capsule Take 1 capsule (100 mg total) by mouth 2 (two) times daily. 03/17/14 03/31/14  Alvino Chapel, MD  HYDROcodone-acetaminophen (NORCO/VICODIN) 5-325 MG per tablet Take 1 tablet by mouth every 4 (four) hours as needed. Patient not taking: Reported on 03/11/2014 01/14/14   Larene Pickett, PA-C  nystatin (MYCOSTATIN) 100000 UNIT/ML suspension Take 5 mLs (500,000 Units total) by mouth 4 (four) times daily. Patient not taking: Reported on 03/17/2014 03/11/14   Margarita Mail, PA-C  predniSONE (DELTASONE) 50 MG tablet Take 1 tablet (50 mg total) by mouth daily. Patient not taking: Reported on 03/17/2014 01/01/14   Varney Biles, MD  sulfamethoxazole-trimethoprim (SEPTRA DS) 800-160 MG per tablet Take 1 tablet by mouth every 12 (twelve) hours. Patient not taking: Reported on 03/17/2014 01/14/14   Larene Pickett,  PA-C   BP 132/66 mmHg  Pulse 86  Temp(Src) 97.9 F (36.6 C) (Oral)  Resp 16  Ht 5\' 9"  (1.753 m)  Wt 250 lb (113.399 kg)  BMI 36.90 kg/m2  SpO2 98%  LMP 12/10/2013 Physical Exam  Constitutional: She is oriented to person, place, and time. She appears well-developed and well-nourished. No distress.  HENT:  Head: Normocephalic and atraumatic.  Mouth/Throat: Oropharynx is clear and moist. No oropharyngeal exudate.  No sign of oral thrush, no tongue swelling, no throat swelling  Eyes: Conjunctivae and EOM are normal.  Neck: Normal range of motion.  Cardiovascular: Normal rate, regular rhythm, normal heart sounds and intact distal pulses.  Exam reveals no gallop and no friction rub.   No murmur heard. Pulmonary/Chest: Effort normal and breath sounds normal. No respiratory distress. She has no wheezes. She has no rales.  Abdominal: Soft. She exhibits no distension. There is tenderness (mild lower abdomen). There is no guarding.  Genitourinary: There is no rash on the right labia. There is no rash on the left labia. Cervix exhibits motion tenderness and discharge. There is tenderness in the vagina. Vaginal discharge found.  Musculoskeletal: She exhibits no edema or tenderness.  Neurological: She is alert and oriented to person, place, and time.  Skin: Skin is warm and dry. No rash noted. She is not diaphoretic. No erythema.  Nursing note and vitals reviewed.   ED Course  Procedures (including critical care time) Labs Review Labs Reviewed  WET PREP, GENITAL - Abnormal; Notable for the following:    WBC, Wet Prep HPF POC MODERATE (*)    All other components within normal limits  URINALYSIS, ROUTINE W REFLEX MICROSCOPIC - Abnormal; Notable for the following:    Specific Gravity, Urine 1.034 (*)    Glucose, UA >1000 (*)    Ketones, ur 15 (*)    Leukocytes, UA TRACE (*)    All other components within normal limits  COMPREHENSIVE METABOLIC PANEL - Abnormal; Notable for the following:     Sodium 134 (*)    CO2 17 (*)    Glucose, Bld 263 (*)    AST 69 (*)    ALT 57 (*)    All other components within normal limits  CBC WITH DIFFERENTIAL - Abnormal; Notable for the following:    Hemoglobin 15.2 (*)    All other components within normal limits  URINE MICROSCOPIC-ADD ON - Abnormal; Notable for the following:    Bacteria, UA FEW (*)    All other components within normal limits  CBG MONITORING, ED - Abnormal; Notable for the following:    Glucose-Capillary 247 (*)    All other components within normal limits  URINE CULTURE  GC/CHLAMYDIA PROBE AMP  LIPASE, BLOOD  POC URINE PREG, ED    Imaging Review Dg Chest 2 View  03/17/2014   CLINICAL DATA:  Cough and congestion  EXAM: CHEST  2 VIEW  COMPARISON:  01/01/2014  FINDINGS: Normal mediastinum and cardiac silhouette. Normal pulmonary vasculature. No evidence of effusion, infiltrate, or pneumothorax. No acute bony abnormality.  IMPRESSION: Normal chest radiograph.   Electronically Signed   By: Suzy Bouchard M.D.   On: 03/17/2014 18:16     EKG Interpretation None      MDM   Final diagnoses:  Cough  Vaginal dryness  PID (acute pelvic inflammatory disease)   29 year old female with a history of diabetes, pt reports COPD, depression, recent diagnosis of oral thrush on nystatin, presents with concern for 3 weeks of abdominal pain, vaginal dryness, cough, and continuing oral pain.  Given abdominal pain has been present for 3 weeks and abdominal exam is benign and have low suspicion that this represents appendicitis, cholecystitis, diverticulitis, tubo-ovarian abscess. Her pregnancy test is negative.  Given dyspareunia, discharge with abdominal pain will treat for PID.  Labs show mild hyperglycemia and acidosis (CO2 17) however no anion gap and doubt DKA. AST/ALT similar to prior. Lipase normal.   Patient reports dyspareunia and vaginal dryness.  Wet prep shows no sign of yeast infection. She does have some vaginal discharge  on exam and offered empiric treating for gonorrhea chlamydia which patient prefers to have.  In addition we'll treat for pelvic inflammatory disease with doxycycline for 2 weeks given weeks of abdominal pain.  Regarding patient's cough, chest x-ray shows no signs of pneumonia. Patient reports either history of asthma or COPD, however does not have any significant tachypnea on exam and given risks of giving prednisone with DM with a mild presentation of cough recommend scheduled albuterol at this time.  Alvino Chapel, MD 03/19/14 [place], MD 03/20/14 [phone]

## 2014-03-18 LAB — URINE CULTURE: Colony Count: >=100000

## 2014-03-19 LAB — GC/CHLAMYDIA PROBE AMP
CT Probe RNA: NEGATIVE
GC Probe RNA: NEGATIVE

## 2014-04-18 ENCOUNTER — Emergency Department (HOSPITAL_COMMUNITY)
Admission: EM | Admit: 2014-04-18 | Discharge: 2014-04-18 | Disposition: A | Discharge status: Home / Self Care | Payer: Medicare Other | Attending: Emergency Medicine | Admitting: Emergency Medicine

## 2014-04-18 ENCOUNTER — Emergency Department (HOSPITAL_COMMUNITY): Payer: Medicare Other

## 2014-04-18 ENCOUNTER — Encounter (HOSPITAL_COMMUNITY): Payer: Self-pay | Admitting: Cardiology

## 2014-04-18 DIAGNOSIS — Z7952 Long term (current) use of systemic steroids: Secondary | ICD-10-CM | POA: Diagnosis not present

## 2014-04-18 DIAGNOSIS — Z79899 Other long term (current) drug therapy: Secondary | ICD-10-CM | POA: Insufficient documentation

## 2014-04-18 DIAGNOSIS — Z720 Tobacco use: Secondary | ICD-10-CM | POA: Diagnosis not present

## 2014-04-18 DIAGNOSIS — R1013 Epigastric pain: Secondary | ICD-10-CM | POA: Diagnosis not present

## 2014-04-18 DIAGNOSIS — J45909 Unspecified asthma, uncomplicated: Secondary | ICD-10-CM | POA: Insufficient documentation

## 2014-04-18 DIAGNOSIS — Z8619 Personal history of other infectious and parasitic diseases: Secondary | ICD-10-CM | POA: Diagnosis not present

## 2014-04-18 DIAGNOSIS — Z87448 Personal history of other diseases of urinary system: Secondary | ICD-10-CM | POA: Insufficient documentation

## 2014-04-18 DIAGNOSIS — E118 Type 2 diabetes mellitus with unspecified complications: Secondary | ICD-10-CM | POA: Insufficient documentation

## 2014-04-18 DIAGNOSIS — Z792 Long term (current) use of antibiotics: Secondary | ICD-10-CM | POA: Insufficient documentation

## 2014-04-18 DIAGNOSIS — Z880 Allergy status to penicillin: Secondary | ICD-10-CM | POA: Insufficient documentation

## 2014-04-18 DIAGNOSIS — I10 Essential (primary) hypertension: Secondary | ICD-10-CM | POA: Insufficient documentation

## 2014-04-18 DIAGNOSIS — E669 Obesity, unspecified: Secondary | ICD-10-CM | POA: Insufficient documentation

## 2014-04-18 DIAGNOSIS — K760 Fatty (change of) liver, not elsewhere classified: Secondary | ICD-10-CM | POA: Diagnosis not present

## 2014-04-18 DIAGNOSIS — R748 Abnormal levels of other serum enzymes: Secondary | ICD-10-CM | POA: Insufficient documentation

## 2014-04-18 DIAGNOSIS — N390 Urinary tract infection, site not specified: Secondary | ICD-10-CM | POA: Diagnosis not present

## 2014-04-18 DIAGNOSIS — R109 Unspecified abdominal pain: Secondary | ICD-10-CM | POA: Diagnosis present

## 2014-04-18 LAB — URINALYSIS, ROUTINE W REFLEX MICROSCOPIC
BILIRUBIN URINE: NEGATIVE
KETONES UR: NEGATIVE mg/dL
Nitrite: POSITIVE — AB
PROTEIN: NEGATIVE mg/dL
Specific Gravity, Urine: 1.031 — ABNORMAL HIGH (ref 1.005–1.030)
UROBILINOGEN UA: 0.2 mg/dL (ref 0.0–1.0)
pH: 5.5 (ref 5.0–8.0)

## 2014-04-18 LAB — CBC WITH DIFFERENTIAL/PLATELET
Basophils Absolute: 0.1 10*3/uL (ref 0.0–0.1)
Basophils Relative: 1 % (ref 0–1)
EOS PCT: 3 % (ref 0–5)
Eosinophils Absolute: 0.3 10*3/uL (ref 0.0–0.7)
HCT: 41.9 % (ref 36.0–46.0)
HEMOGLOBIN: 15.3 g/dL — AB (ref 12.0–15.0)
Lymphocytes Relative: 35 % (ref 12–46)
Lymphs Abs: 3 10*3/uL (ref 0.7–4.0)
MCH: 30.7 pg (ref 26.0–34.0)
MCHC: 36.5 g/dL — AB (ref 30.0–36.0)
MCV: 84 fL (ref 78.0–100.0)
Monocytes Absolute: 0.5 10*3/uL (ref 0.1–1.0)
Monocytes Relative: 6 % (ref 3–12)
Neutro Abs: 4.6 10*3/uL (ref 1.7–7.7)
Neutrophils Relative %: 55 % (ref 43–77)
PLATELETS: 264 10*3/uL (ref 150–400)
RBC: 4.99 MIL/uL (ref 3.87–5.11)
RDW: 12.5 % (ref 11.5–15.5)
WBC: 8.4 10*3/uL (ref 4.0–10.5)

## 2014-04-18 LAB — COMPREHENSIVE METABOLIC PANEL
ALBUMIN: 3.8 g/dL (ref 3.5–5.2)
ALT: 40 U/L — AB (ref 0–35)
AST: 55 U/L — AB (ref 0–37)
Alkaline Phosphatase: 65 U/L (ref 39–117)
Anion gap: 10 (ref 5–15)
BILIRUBIN TOTAL: 0.5 mg/dL (ref 0.3–1.2)
BUN: 5 mg/dL — AB (ref 6–23)
CO2: 20 mmol/L (ref 19–32)
Calcium: 8.7 mg/dL (ref 8.4–10.5)
Chloride: 102 mmol/L (ref 96–112)
Creatinine, Ser: 0.62 mg/dL (ref 0.50–1.10)
GFR calc Af Amer: >90 mL/min (ref >90)
GFR calc non Af Amer: >90 mL/min (ref >90)
GLUCOSE: 347 mg/dL — AB (ref 70–99)
Potassium: 3.8 mmol/L (ref 3.5–5.1)
Sodium: 132 mmol/L — ABNORMAL LOW (ref 135–145)
TOTAL PROTEIN: 6.4 g/dL (ref 6.0–8.3)

## 2014-04-18 LAB — CBG MONITORING, ED: Glucose-Capillary: 148 mg/dL — ABNORMAL HIGH (ref 70–99)

## 2014-04-18 LAB — URINE MICROSCOPIC-ADD ON

## 2014-04-18 LAB — POC URINE PREG, ED: Preg Test, Ur: NEGATIVE

## 2014-04-18 LAB — LIPASE, BLOOD: LIPASE: 29 U/L (ref 11–59)

## 2014-04-18 MED ORDER — MORPHINE SULFATE 4 MG/ML IJ SOLN
4.0000 mg | Freq: Once | INTRAMUSCULAR | Status: AC
Start: 2014-04-18 — End: 2014-04-18
  Administered 2014-04-18: 4 mg via INTRAVENOUS
  Filled 2014-04-18: qty 1

## 2014-04-18 MED ORDER — CIPROFLOXACIN HCL 500 MG PO TABS: 500.0000 mg | ORAL_TABLET | Freq: Two times a day (BID) | ORAL | Status: DC

## 2014-04-18 MED ORDER — CEFTRIAXONE SODIUM 1 G IJ SOLR
1.0000 g | Freq: Once | INTRAMUSCULAR | Status: AC
  Administered 2014-04-18: 1 g via INTRAVENOUS
  Filled 2014-04-18: qty 10

## 2014-04-18 MED ORDER — IBUPROFEN 600 MG PO TABS: 600.0000 mg | ORAL_TABLET | Freq: Four times a day (QID) | ORAL | Status: DC | PRN

## 2014-04-18 MED ORDER — ONDANSETRON HCL 4 MG/2ML IJ SOLN
4.0000 mg | Freq: Once | INTRAMUSCULAR | Status: AC
  Administered 2014-04-18: 4 mg via INTRAVENOUS
  Filled 2014-04-18: qty 2

## 2014-04-18 MED ORDER — SODIUM CHLORIDE 0.9 % IV BOLUS (SEPSIS)
1000.0000 mL | Freq: Once | INTRAVENOUS | Status: AC
  Administered 2014-04-18: 1000 mL via INTRAVENOUS

## 2014-04-18 MED ORDER — GI COCKTAIL ~~LOC~~
30.0000 mL | Freq: Once | ORAL | Status: AC
  Administered 2014-04-18: 30 mL via ORAL
  Filled 2014-04-18: qty 30

## 2014-04-18 MED ORDER — PHENAZOPYRIDINE HCL 200 MG PO TABS: 200.0000 mg | ORAL_TABLET | Freq: Three times a day (TID) | ORAL | Status: DC

## 2014-04-18 NOTE — ED Provider Notes (Signed)
Pt signed out at shift change by Delos Haring, PA-C.  Pt is a 29yo female presenting to ED with c/o abdominal pain for 5 days.  States her stomach feels tight when she eats, mild nausea and vomiting.  Pt has a hx of Type II NIDDM, suppose to be on metformin but took herself off the medication as she does not like how it makes her feel.  Vitals: unremarkable, pt is afebrile. Non-toxic appearing.  On exam, pt is non-toxic appearing. Abdomen is soft, tenderness in epigastrium, RUQ, and suprapubic region.  Exam limited by body habitus.  Labs in ED significant for glucose of 347, pt started on 2L IV fluids.  LFTs elevated, however, hx of same. UA: evidence of UTI with positive nitrites and WBC- TNTC.  Pt given IV rocephin for UTI.   Pt also given GI cocktail and IV morphine which has helped with pain.   U/S abdomen pending.  If unremarkable, and glucose improves to below 250, she may be discharged home with keflex and f/u with PCP.  Results for orders placed or performed during the hospital encounter of 04/18/14  CBC WITH DIFFERENTIAL  Result Value Ref Range   WBC 8.4 4.0 - 10.5 K/uL   RBC 4.99 3.87 - 5.11 MIL/uL   Hemoglobin 15.3 (H) 12.0 - 15.0 g/dL   HCT 41.9 36.0 - 46.0 %   MCV 84.0 78.0 - 100.0 fL   MCH 30.7 26.0 - 34.0 pg   MCHC 36.5 (H) 30.0 - 36.0 g/dL   RDW 12.5 11.5 - 15.5 %   Platelets 264 150 - 400 K/uL   Neutrophils Relative % 55 43 - 77 %   Neutro Abs 4.6 1.7 - 7.7 K/uL   Lymphocytes Relative 35 12 - 46 %   Lymphs Abs 3.0 0.7 - 4.0 K/uL   Monocytes Relative 6 3 - 12 %   Monocytes Absolute 0.5 0.1 - 1.0 K/uL   Eosinophils Relative 3 0 - 5 %   Eosinophils Absolute 0.3 0.0 - 0.7 K/uL   Basophils Relative 1 0 - 1 %   Basophils Absolute 0.1 0.0 - 0.1 K/uL  Comprehensive metabolic panel  Result Value Ref Range   Sodium 132 (L) 135 - 145 mmol/L   Potassium 3.8 3.5 - 5.1 mmol/L   Chloride 102 96 - 112 mmol/L   CO2 20 19 - 32 mmol/L   Glucose, Bld 347 (H) 70 - 99 mg/dL   BUN 5 (L)  6 - 23 mg/dL   Creatinine, Ser 0.62 0.50 - 1.10 mg/dL   Calcium 8.7 8.4 - 10.5 mg/dL   Total Protein 6.4 6.0 - 8.3 g/dL   Albumin 3.8 3.5 - 5.2 g/dL   AST 55 (H) 0 - 37 U/L   ALT 40 (H) 0 - 35 U/L   Alkaline Phosphatase 65 39 - 117 U/L   Total Bilirubin 0.5 0.3 - 1.2 mg/dL   GFR calc non Af Amer >90 >90 mL/min   GFR calc Af Amer >90 >90 mL/min   Anion gap 10 5 - 15  Urinalysis with microscopic  Result Value Ref Range   Color, Urine YELLOW YELLOW   APPearance CLOUDY (A) CLEAR   Specific Gravity, Urine 1.031 (H) 1.005 - 1.030   pH 5.5 5.0 - 8.0   Glucose, UA >1000 (A) NEGATIVE mg/dL   Hgb urine dipstick SMALL (A) NEGATIVE   Bilirubin Urine NEGATIVE NEGATIVE   Ketones, ur NEGATIVE NEGATIVE mg/dL   Protein, ur NEGATIVE NEGATIVE mg/dL  Urobilinogen, UA 0.2 0.0 - 1.0 mg/dL   Nitrite POSITIVE (A) NEGATIVE   Leukocytes, UA SMALL (A) NEGATIVE  Urine microscopic-add on  Result Value Ref Range   Squamous Epithelial / LPF RARE RARE   WBC, UA TOO NUMEROUS TO COUNT <3 WBC/hpf   RBC / HPF 3-6 <3 RBC/hpf   Bacteria, UA MANY (A) RARE  Lipase, blood  Result Value Ref Range   Lipase 29 11 - 59 U/L  POC Urine Preg, ED (do not order at [facility])  Result Value Ref Range   Preg Test, Ur NEGATIVE NEGATIVE  CBG monitoring, ED  Result Value Ref Range   Glucose-Capillary 148 (H) 70 - 99 mg/dL   Comment 1 Documented in Chart    Comment 2 Notify RN    US Abdomen Complete  04/18/2014   CLINICAL DATA:  Epigastric abdominal pain.  EXAM: ULTRASOUND ABDOMEN COMPLETE  COMPARISON:  None.  FINDINGS: Gallbladder: Limited visualization due to rib shadowing. No gallstones, gallbladder wall thickening, or sludge identified. Murphy's sign is negative.  Common bile duct: Diameter: 4.8 mm, normal  Liver: Diffusely increased parenchymal echotexture consistent with fatty infiltration. No focal liver lesions are identified although increased echotexture may limit evaluation.  IVC: No abnormality visualized.  Pancreas: Not  visualized due to overlying bowel gas.  Spleen: Size and appearance within normal limits.  Right Kidney: Length: 13.2 cm. Echogenicity within normal limits. No mass or hydronephrosis visualized.  Left Kidney: Length: 14 cm. Echogenicity within normal limits. No mass or hydronephrosis visualized.  Abdominal aorta: Visualized portions demonstrate normal caliber.  Other findings: None.  IMPRESSION: Diffusely increased liver echotexture consistent with fatty infiltration. No acute abnormality.   Electronically Signed   By: Lucienne Capers M.D.   On: 04/18/2014 17:18   5:57 PM CBG: 148.  U/S: diffusely increased liver echotexture consistent with fatty infiltration. No acute abnormality.  Lipase pending.  Pt states abdominal pain is starting to increase again.  Will give fluid challenge.   6:27 PM Pt has been able to keep down several ounces of PO fluids. Lipase: normal.  Discussed labs and imaging with pt. Pt states she feels comfortable being discharged home with antibiotics and pain medication. Advised to f/u with her PCP for continued monitoring of liver function as well as recheck of symptoms. Return precautions provided. Pt verbalized understanding and agreement with tx plan.    Noland Fordyce, PA-C 04/18/14 1848  Evelina Bucy, MD 04/18/14 [phone]

## 2014-04-18 NOTE — ED Notes (Signed)
Pt given crackers and gingerale for PO challenge.

## 2014-04-18 NOTE — ED Provider Notes (Signed)
CSN: [id number]     Arrival date & time 04/18/14  1416 History   First MD Initiated Contact with Patient 04/18/14 1424     Chief Complaint  Patient presents with  . Abdominal Pain     (Consider location/radiation/quality/duration/timing/severity/associated sxs/prior Treatment) HPI    PCP: LITTLE,JAMES, MD Blood pressure 134/79, pulse 98, temperature 98 F (36.7 C), temperature source Oral, resp. rate 18, weight 250 lb (113.399 kg), last menstrual period 04/11/2014, SpO2 95 %.  Rebecca Moyer is a 29 y.o.female with a significant PMH of asthma, irregular periods, depression, hypertension, PCOS, obesity, smoker presents to the ER with complaints of abdominal pain for the last 5 days that is epigastric, constant, worsens with eating. She also reports it worsens with exertion and improves some with rest. Pain is 10/10 when its at its worst and at 7/10 at rest. Then she is also having lower abdominal generalized soreness. She is also concerned about abdominal "masses" or bumps that she recently noticed a little over a month ago. She does not know how long they have been there. Pt is also having diarrhea. No urinary symptoms or vaginal symptoms, CP, SOB, confusion, weakness or fevers.   Past Medical History  Diagnosis Date  . Asthma 06/07/11  . H/O varicella   . H/O amenorrhea 01/27/04  . Irregular periods/menstrual cycles 05/22/04  . Depression 06/07/11  . Hypertension 06/07/11  . PCOS (polycystic ovarian syndrome) 06/07/11  . Obesity 06/07/11  . Smoker 06/07/11   Past Surgical History  Procedure Laterality Date  . Tonsillectomy  06/07/11  . Wisdom tooth extraction  06/07/11  . Adnoids  06/07/11   History reviewed. No pertinent family history. History  Substance Use Topics  . Smoking status: Current Every Day Smoker -- 0.50 packs/day    Types: Cigarettes  . Smokeless tobacco: Not on file     Comment: pt states she is try to quite as of now  . Alcohol Use: No   OB History    No data  available     Review of Systems  10 Systems reviewed and are negative for acute change except as noted in the HPI.     Allergies  Penicillins; Naproxen; Triamterene; Ultram; and Fentanyl  Home Medications   Prior to Admission medications   Medication Sig Start Date End Date Taking? Authorizing Provider  albuterol (PROVENTIL HFA;VENTOLIN HFA) 108 (90 BASE) MCG/ACT inhaler Inhale 2 puffs into the lungs every 6 (six) hours as needed for wheezing or shortness of breath. 01/01/14  Yes Ankit Kathrynn Humble, MD  albuterol (PROVENTIL) (2.5 MG/3ML) 0.083% nebulizer solution Take 2.5 mg by nebulization every 8 (eight) hours as needed. Shortness of breath   Yes Historical Provider, MD  ALPRAZolam (XANAX) 1 MG tablet Take 1 mg by mouth 3 (three) times daily. Scheduled doses for anxiety   Yes Historical Provider, MD  atenolol (TENORMIN) 50 MG tablet Take 50 mg by mouth every evening.  01/20/14  Yes Historical Provider, MD  diltiazem (CARDIZEM CD) 120 MG 24 hr capsule Take 120 mg by mouth daily. 01/25/14  Yes Historical Provider, MD  fenofibrate (TRICOR) 145 MG tablet Take 145 mg by mouth daily.   Yes Historical Provider, MD  haloperidol (HALDOL) 0.5 MG tablet Take 0.5 mg by mouth daily.   Yes Historical Provider, MD  lansoprazole (PREVACID) 30 MG capsule Take 30 mg by mouth daily at 12 noon.   Yes Historical Provider, MD  Liraglutide (VICTOZA) 18 MG/3ML SOPN Inject 1.8 mg into the skin daily.  Yes Historical Provider, MD  metFORMIN (GLUCOPHAGE) 500 MG tablet Take 500 mg by mouth daily.   Yes Historical Provider, MD  mirtazapine (REMERON) 30 MG tablet Take 30 mg by mouth at bedtime.   Yes Historical Provider, MD  oxyCODONE-acetaminophen (PERCOCET/ROXICET) 5-325 MG per tablet Take 1 tablet by mouth every 8 (eight) hours as needed for severe pain.   Yes Historical Provider, MD  Alum & Mag Hydroxide-Simeth (MAGIC MOUTHWASH W/LIDOCAINE) SOLN Take 5 mLs by mouth 4 (four) times daily as needed for mouth  pain. Patient not taking: Reported on 03/17/2014 03/11/14   Margarita Mail, PA-C  canagliflozin [facility]) 100 MG TABS tablet Take 0.5 tablets (50 mg total) by mouth twice daily. Patient not taking: Reported on 04/18/2014 03/11/14   Margarita Mail, PA-C  diphenhydrAMINE (BENADRYL) 25 mg capsule Take 1 capsule (25 mg total) by mouth every 6 (six) hours as needed for itching. Patient not taking: Reported on 03/17/2014 01/01/14   Varney Biles, MD  HYDROcodone-acetaminophen (NORCO/VICODIN) 5-325 MG per tablet Take 1 tablet by mouth every 4 (four) hours as needed. Patient not taking: Reported on 03/11/2014 01/14/14   Larene Pickett, PA-C  nystatin (MYCOSTATIN) 100000 UNIT/ML suspension Take 5 mLs (500,000 Units total) by mouth 4 (four) times daily. Patient not taking: Reported on 03/17/2014 03/11/14   Margarita Mail, PA-C  predniSONE (DELTASONE) 50 MG tablet Take 1 tablet (50 mg total) by mouth daily. Patient not taking: Reported on 03/17/2014 01/01/14   Varney Biles, MD  sulfamethoxazole-trimethoprim (SEPTRA DS) 800-160 MG per tablet Take 1 tablet by mouth every 12 (twelve) hours. Patient not taking: Reported on 03/17/2014 01/14/14   Larene Pickett, PA-C   BP 134/79 mmHg  Pulse 98  Temp(Src) 98 F (36.7 C) (Oral)  Resp 18  Wt 250 lb (113.399 kg)  SpO2 95%  LMP 04/11/2014 Physical Exam  Constitutional: She appears well-developed and well-nourished. No distress.  HENT:  Head: Normocephalic and atraumatic.  Eyes: Pupils are equal, round, and reactive to light.  Neck: Normal range of motion. Neck supple.  Cardiovascular: Normal rate and regular rhythm.   Pulmonary/Chest: Effort normal.  Abdominal: Soft. Bowel sounds are normal. There is tenderness in the right lower quadrant, epigastric area and suprapubic area. There is no rebound, no guarding and no CVA tenderness.  Exam limited to body habitus  Neurological: She is alert.  Skin: Skin is warm and dry.  Nursing note and vitals reviewed.   ED  Course  Procedures (including critical care time) Labs Review Labs Reviewed  CBC WITH DIFFERENTIAL/PLATELET - Abnormal; Notable for the following:    Hemoglobin 15.3 (*)    MCHC 36.5 (*)    All other components within normal limits  COMPREHENSIVE METABOLIC PANEL - Abnormal; Notable for the following:    Sodium 132 (*)    Glucose, Bld 347 (*)    BUN 5 (*)    AST 55 (*)    ALT 40 (*)    All other components within normal limits  URINALYSIS, ROUTINE W REFLEX MICROSCOPIC - Abnormal; Notable for the following:    APPearance CLOUDY (*)    Specific Gravity, Urine 1.031 (*)    Glucose, UA >1000 (*)    Hgb urine dipstick SMALL (*)    Nitrite POSITIVE (*)    Leukocytes, UA SMALL (*)    All other components within normal limits  URINE MICROSCOPIC-ADD ON - Abnormal; Notable for the following:    Bacteria, UA MANY (*)    All other components within  normal limits  LIPASE, BLOOD  POC URINE PREG, ED  GC/CHLAMYDIA PROBE AMP ([place])    Imaging Review No results found.   EKG Interpretation None      MDM   Final diagnoses:  Epigastric abdominal pain  Type 2 diabetes mellitus with complication  Elevated liver enzymes  UTI (lower urinary tract infection)    Patient was on diabetes medication and was recently switched to Metformin but has not been compliant due to it not making her feel well.    At end of shift patient hand off to Noland Fordyce, PA-C Problems List: 1.Her glucose is 347- no acidosis or anion gap. Will give 2 L fluids. Would recommend glucose improve to < 250 before discharge. Pt needs to restart Metformin and talk to her PCP about changing the medication if she is not tolerating it well.  2. She also has UTI with positive nitrite, small leukocytes, and many bacteria with too many bacteria to count. - will start on IV Rocephin in the ED and recommend discharge with Cipro  3. RUQ pain, elevated liver enzymes-  Lipase was cancelled somehow, I have reordered this  at 4:26pm. The patient will be getting abdominal US to further evaluation pain and lab abnormality. If she has a surgical problem please consult surgery. If [place] normal, I recommend starting on PPI's and referring to GI, she may need endoscopy.      Linus Mako, PA-C 04/18/14 [place], MD 04/26/14 [phone]

## 2014-04-18 NOTE — ED Notes (Signed)
CBG is 148. Notified nurse [place] Abts.

## 2014-04-18 NOTE — ED Notes (Signed)
Pt reports upper abd pain for the past 5 days. States she has a hard time eating and her stomach feels tight. Pt reports some nausea and vomiting.

## 2014-04-21 ENCOUNTER — Emergency Department (HOSPITAL_COMMUNITY)
Admission: EM | Admit: 2014-04-21 | Discharge: 2014-04-21 | Disposition: A | Discharge status: Home / Self Care | Payer: Medicare Other | Attending: Emergency Medicine | Admitting: Emergency Medicine

## 2014-04-21 ENCOUNTER — Encounter (HOSPITAL_COMMUNITY): Payer: Self-pay | Admitting: Emergency Medicine

## 2014-04-21 DIAGNOSIS — Z792 Long term (current) use of antibiotics: Secondary | ICD-10-CM | POA: Insufficient documentation

## 2014-04-21 DIAGNOSIS — G8929 Other chronic pain: Secondary | ICD-10-CM

## 2014-04-21 DIAGNOSIS — I10 Essential (primary) hypertension: Secondary | ICD-10-CM | POA: Insufficient documentation

## 2014-04-21 DIAGNOSIS — R112 Nausea with vomiting, unspecified: Secondary | ICD-10-CM | POA: Insufficient documentation

## 2014-04-21 DIAGNOSIS — Z87440 Personal history of urinary (tract) infections: Secondary | ICD-10-CM | POA: Diagnosis not present

## 2014-04-21 DIAGNOSIS — R1031 Right lower quadrant pain: Secondary | ICD-10-CM | POA: Insufficient documentation

## 2014-04-21 DIAGNOSIS — R1033 Periumbilical pain: Secondary | ICD-10-CM | POA: Diagnosis not present

## 2014-04-21 DIAGNOSIS — Z79899 Other long term (current) drug therapy: Secondary | ICD-10-CM | POA: Insufficient documentation

## 2014-04-21 DIAGNOSIS — R1011 Right upper quadrant pain: Secondary | ICD-10-CM | POA: Insufficient documentation

## 2014-04-21 DIAGNOSIS — Z3202 Encounter for pregnancy test, result negative: Secondary | ICD-10-CM | POA: Diagnosis not present

## 2014-04-21 DIAGNOSIS — J45909 Unspecified asthma, uncomplicated: Secondary | ICD-10-CM | POA: Diagnosis not present

## 2014-04-21 DIAGNOSIS — R1032 Left lower quadrant pain: Secondary | ICD-10-CM | POA: Diagnosis not present

## 2014-04-21 DIAGNOSIS — F329 Major depressive disorder, single episode, unspecified: Secondary | ICD-10-CM | POA: Diagnosis not present

## 2014-04-21 DIAGNOSIS — Z880 Allergy status to penicillin: Secondary | ICD-10-CM | POA: Insufficient documentation

## 2014-04-21 DIAGNOSIS — R7989 Other specified abnormal findings of blood chemistry: Secondary | ICD-10-CM | POA: Insufficient documentation

## 2014-04-21 DIAGNOSIS — Z8619 Personal history of other infectious and parasitic diseases: Secondary | ICD-10-CM | POA: Insufficient documentation

## 2014-04-21 DIAGNOSIS — Z8742 Personal history of other diseases of the female genital tract: Secondary | ICD-10-CM | POA: Insufficient documentation

## 2014-04-21 DIAGNOSIS — E669 Obesity, unspecified: Secondary | ICD-10-CM | POA: Diagnosis not present

## 2014-04-21 DIAGNOSIS — Z720 Tobacco use: Secondary | ICD-10-CM | POA: Insufficient documentation

## 2014-04-21 DIAGNOSIS — R109 Unspecified abdominal pain: Secondary | ICD-10-CM

## 2014-04-21 DIAGNOSIS — K921 Melena: Secondary | ICD-10-CM

## 2014-04-21 LAB — COMPREHENSIVE METABOLIC PANEL
ALK PHOS: 62 U/L (ref 39–117)
ALT: 51 U/L — ABNORMAL HIGH (ref 0–35)
AST: 79 U/L — AB (ref 0–37)
Albumin: 3.7 g/dL (ref 3.5–5.2)
Anion gap: 8 (ref 5–15)
BUN: 8 mg/dL (ref 6–23)
CHLORIDE: 101 mmol/L (ref 96–112)
CO2: 23 mmol/L (ref 19–32)
Calcium: 9.1 mg/dL (ref 8.4–10.5)
Creatinine, Ser: 0.61 mg/dL (ref 0.50–1.10)
GFR calc Af Amer: >90 mL/min (ref >90)
GFR calc non Af Amer: >90 mL/min (ref >90)
Glucose, Bld: 306 mg/dL — ABNORMAL HIGH (ref 70–99)
POTASSIUM: 4.6 mmol/L (ref 3.5–5.1)
Sodium: 132 mmol/L — ABNORMAL LOW (ref 135–145)
Total Bilirubin: 1.3 mg/dL — ABNORMAL HIGH (ref 0.3–1.2)
Total Protein: 6.5 g/dL (ref 6.0–8.3)

## 2014-04-21 LAB — URINALYSIS, ROUTINE W REFLEX MICROSCOPIC
BILIRUBIN URINE: NEGATIVE
Glucose, UA: >1000 mg/dL — AB
Hgb urine dipstick: NEGATIVE
KETONES UR: NEGATIVE mg/dL
NITRITE: POSITIVE — AB
Protein, ur: NEGATIVE mg/dL
Specific Gravity, Urine: 1.027 (ref 1.005–1.030)
Urobilinogen, UA: 0.2 mg/dL (ref 0.0–1.0)
pH: 5.5 (ref 5.0–8.0)

## 2014-04-21 LAB — CBC WITH DIFFERENTIAL/PLATELET
BASOS PCT: 1 % (ref 0–1)
Basophils Absolute: 0.1 10*3/uL (ref 0.0–0.1)
EOS ABS: 0.2 10*3/uL (ref 0.0–0.7)
EOS PCT: 3 % (ref 0–5)
HCT: 43.4 % (ref 36.0–46.0)
Hemoglobin: 15.9 g/dL — ABNORMAL HIGH (ref 12.0–15.0)
LYMPHS ABS: 2.8 10*3/uL (ref 0.7–4.0)
Lymphocytes Relative: 41 % (ref 12–46)
MCH: 31.4 pg (ref 26.0–34.0)
MCHC: 36.6 g/dL — AB (ref 30.0–36.0)
MCV: 85.8 fL (ref 78.0–100.0)
MONOS PCT: 4 % (ref 3–12)
Monocytes Absolute: 0.3 10*3/uL (ref 0.1–1.0)
NEUTROS PCT: 50 % (ref 43–77)
Neutro Abs: 3.4 10*3/uL (ref 1.7–7.7)
Platelets: 294 10*3/uL (ref 150–400)
RBC: 5.06 MIL/uL (ref 3.87–5.11)
RDW: 12.9 % (ref 11.5–15.5)
WBC: 6.8 10*3/uL (ref 4.0–10.5)

## 2014-04-21 LAB — URINE MICROSCOPIC-ADD ON

## 2014-04-21 LAB — POC URINE PREG, ED: Preg Test, Ur: NEGATIVE

## 2014-04-21 LAB — LIPASE, BLOOD: Lipase: 30 U/L (ref 11–59)

## 2014-04-21 MED ORDER — METOCLOPRAMIDE HCL 10 MG PO TABS: 10.0000 mg | ORAL_TABLET | Freq: Four times a day (QID) | ORAL | Status: DC

## 2014-04-21 MED ORDER — ONDANSETRON HCL 4 MG/2ML IJ SOLN
4.0000 mg | INTRAMUSCULAR | Status: AC
  Administered 2014-04-21: 4 mg via INTRAVENOUS
  Filled 2014-04-21: qty 2

## 2014-04-21 MED ORDER — SODIUM CHLORIDE 0.9 % IV BOLUS (SEPSIS)
1000.0000 mL | INTRAVENOUS | Status: AC
  Administered 2014-04-21: 1000 mL via INTRAVENOUS

## 2014-04-21 MED ORDER — DICYCLOMINE HCL 20 MG PO TABS: 20.0000 mg | ORAL_TABLET | Freq: Two times a day (BID) | ORAL | Status: DC

## 2014-04-21 NOTE — ED Provider Notes (Signed)
CSN: [id number]     Arrival date & time 04/21/14  1930 History   First MD Initiated Contact with Patient 04/21/14 2007     Chief Complaint  Patient presents with  . Abdominal Pain  . Hematochezia   (Consider location/radiation/quality/duration/timing/severity/associated sxs/prior Treatment) HPI Rebecca Moyer is a 29 yo female presenting with report of abd pain and bloody stools.  She states she first noticed the bloody stools yesterday and had three episodes over the course of the day.  She describes the stool as mostly bloody with soft stool.  She reports 3 episodes today also. She reports she has taken her usual medicine including percocet 5 without relief.  She states she began to feel light-headed today and reports 2 episodes of vomiting after eating.  Her LMP was 2.5 weeks ago.  She denies any fever, chills, vaginal symptoms or dysuria.   Past Medical History  Diagnosis Date  . Asthma 06/07/11  . H/O varicella   . H/O amenorrhea 01/27/04  . Irregular periods/menstrual cycles 05/22/04  . Depression 06/07/11  . Hypertension 06/07/11  . PCOS (polycystic ovarian syndrome) 06/07/11  . Obesity 06/07/11  . Smoker 06/07/11   Past Surgical History  Procedure Laterality Date  . Tonsillectomy  06/07/11  . Wisdom tooth extraction  06/07/11  . Adnoids  06/07/11   No family history on file. History  Substance Use Topics  . Smoking status: Current Every Day Smoker -- 0.50 packs/day    Types: Cigarettes  . Smokeless tobacco: Not on file     Comment: pt states she is try to quite as of now  . Alcohol Use: No   OB History    No data available     Review of Systems  Constitutional: Negative for fever and chills.  HENT: Negative for sore throat.   Eyes: Negative for visual disturbance.  Respiratory: Negative for cough and shortness of breath.   Cardiovascular: Negative for chest pain and leg swelling.  Gastrointestinal: Positive for nausea, vomiting, abdominal pain and blood in stool.  Negative for diarrhea.  Genitourinary: Negative for dysuria.  Musculoskeletal: Negative for myalgias.  Skin: Negative for rash.  Neurological: Negative for weakness, numbness and headaches.     Allergies  Penicillins; Naproxen; Triamterene; Ultram; and Fentanyl  Home Medications   Prior to Admission medications   Medication Sig Start Date End Date Taking? Authorizing Provider  albuterol (PROVENTIL HFA;VENTOLIN HFA) 108 (90 BASE) MCG/ACT inhaler Inhale 2 puffs into the lungs every 6 (six) hours as needed for wheezing or shortness of breath. 01/01/14   Varney Biles, MD  albuterol (PROVENTIL) (2.5 MG/3ML) 0.083% nebulizer solution Take 2.5 mg by nebulization every 8 (eight) hours as needed. Shortness of breath    Historical Provider, MD  ALPRAZolam Duanne Moron) 1 MG tablet Take 1 mg by mouth 3 (three) times daily. Scheduled doses for anxiety    Historical Provider, MD  Alum & Mag Hydroxide-Simeth (MAGIC MOUTHWASH W/LIDOCAINE) SOLN Take 5 mLs by mouth 4 (four) times daily as needed for mouth pain. Patient not taking: Reported on 03/17/2014 03/11/14   Margarita Mail, PA-C  atenolol (TENORMIN) 50 MG tablet Take 50 mg by mouth every evening.  01/20/14   Historical Provider, MD  canagliflozin (INVOKANA) 100 MG TABS tablet Take 0.5 tablets (50 mg total) by mouth twice daily. Patient not taking: Reported on 04/18/2014 03/11/14   Margarita Mail, PA-C  ciprofloxacin (CIPRO) 500 MG tablet Take 1 tablet (500 mg total) by mouth 2 (two) times daily. One po  bid x 5 days 04/18/14   Noland Fordyce, PA-C  diltiazem (CARDIZEM CD) 120 MG 24 hr capsule Take 120 mg by mouth daily. 01/25/14   Historical Provider, MD  diphenhydrAMINE (BENADRYL) 25 mg capsule Take 1 capsule (25 mg total) by mouth every 6 (six) hours as needed for itching. Patient not taking: Reported on 03/17/2014 01/01/14   Varney Biles, MD  fenofibrate (TRICOR) 145 MG tablet Take 145 mg by mouth daily.    Historical Provider, MD  haloperidol (HALDOL) 0.5  MG tablet Take 0.5 mg by mouth daily.    Historical Provider, MD  HYDROcodone-acetaminophen (NORCO/VICODIN) 5-325 MG per tablet Take 1 tablet by mouth every 4 (four) hours as needed. Patient not taking: Reported on 03/11/2014 01/14/14   Larene Pickett, PA-C  ibuprofen (ADVIL,MOTRIN) 600 MG tablet Take 1 tablet (600 mg total) by mouth every 6 (six) hours as needed. 04/18/14   Noland Fordyce, PA-C  lansoprazole (PREVACID) 30 MG capsule Take 30 mg by mouth daily at 12 noon.    Historical Provider, MD  Liraglutide (VICTOZA) 18 MG/3ML SOPN Inject 1.8 mg into the skin daily.     Historical Provider, MD  metFORMIN (GLUCOPHAGE) 500 MG tablet Take 500 mg by mouth daily.    Historical Provider, MD  mirtazapine (REMERON) 30 MG tablet Take 30 mg by mouth at bedtime.    Historical Provider, MD  nystatin (MYCOSTATIN) 100000 UNIT/ML suspension Take 5 mLs (500,000 Units total) by mouth 4 (four) times daily. Patient not taking: Reported on 03/17/2014 03/11/14   Margarita Mail, PA-C  oxyCODONE-acetaminophen (PERCOCET/ROXICET) 5-325 MG per tablet Take 1 tablet by mouth every 8 (eight) hours as needed for severe pain.    Historical Provider, MD  phenazopyridine (PYRIDIUM) 200 MG tablet Take 1 tablet (200 mg total) by mouth 3 (three) times daily. 04/18/14   Noland Fordyce, PA-C  predniSONE (DELTASONE) 50 MG tablet Take 1 tablet (50 mg total) by mouth daily. Patient not taking: Reported on 03/17/2014 01/01/14   Varney Biles, MD  sulfamethoxazole-trimethoprim (SEPTRA DS) 800-160 MG per tablet Take 1 tablet by mouth every 12 (twelve) hours. Patient not taking: Reported on 03/17/2014 01/14/14   Larene Pickett, PA-C   BP 147/85 mmHg  Pulse 107  Temp(Src) 98.4 F (36.9 C) (Oral)  Resp 18  SpO2 95%  LMP 04/11/2014 Physical Exam  Constitutional: She appears well-developed and well-nourished. No distress.  HENT:  Head: Normocephalic and atraumatic.  Mouth/Throat: Oropharynx is clear and moist. No oropharyngeal exudate.  Eyes:  Conjunctivae are normal.  Neck: Neck supple. No thyromegaly present.  Cardiovascular: Normal rate, regular rhythm and intact distal pulses.   Pulmonary/Chest: Effort normal and breath sounds normal. No respiratory distress. She has no wheezes. She has no rales. She exhibits no tenderness.  Abdominal: Soft. She exhibits no distension and no mass. There is tenderness in the right upper quadrant, right lower quadrant, periumbilical area and left lower quadrant. There is no rigidity, no rebound, no guarding, no CVA tenderness, no tenderness at McBurney's point and negative Murphy's sign.    Musculoskeletal: She exhibits no tenderness.  Lymphadenopathy:    She has no cervical adenopathy.  Neurological: She is alert.  Skin: Skin is warm and dry. No rash noted. She is not diaphoretic.  Psychiatric: She has a normal mood and affect.  Nursing note and vitals reviewed.   ED Course  Procedures (including critical care time) Labs Review Labs Reviewed  CBC WITH DIFFERENTIAL/PLATELET - Abnormal; Notable for the following:  Hemoglobin 15.9 (*)    MCHC 36.6 (*)    All other components within normal limits  COMPREHENSIVE METABOLIC PANEL - Abnormal; Notable for the following:    Sodium 132 (*)    Glucose, Bld 306 (*)    AST 79 (*)    ALT 51 (*)    Total Bilirubin 1.3 (*)    All other components within normal limits  URINALYSIS, ROUTINE W REFLEX MICROSCOPIC - Abnormal; Notable for the following:    Color, Urine AMBER (*)    Glucose, UA >1000 (*)    Nitrite POSITIVE (*)    Leukocytes, UA SMALL (*)    All other components within normal limits  URINE MICROSCOPIC-ADD ON - Abnormal; Notable for the following:    Squamous Epithelial / LPF FEW (*)    Bacteria, UA FEW (*)    All other components within normal limits  POC OCCULT BLOOD, ED - Abnormal; Notable for the following:    Fecal Occult Bld POSITIVE (*)    All other components within normal limits  LIPASE, BLOOD  POC URINE PREG, ED     Imaging Review No results found.   EKG Interpretation None      MDM   Final diagnoses:  Bloody stools  Chronic abdominal pain   29 yo with persistent and recurrent abd pain now with bloody stools.  NS bolus, CBC, CMP, Lipase UA Upreg, zofran, and hemoccult.  Discussed case with Dr. Eulis Foster.   Labs reviewed, despite positive hemoccult, hgb 15.9. Her glucose is elevated which she reports she has been compliant with her meds but not with her diet.  Despite elevated blood glucose, her anion gap is 8. Her AST and ALT are elevated but this is not significantly unchanged from last assessment.  Her UA has nitrites but she is still receiving treatment for UTI and her WBC have decreased from last analysis, so UTI treatment seems effective.  Her pain and other symptoms managed in emergency department. She does not meet the SIRS or Sepsis criteria.    On repeat abd exam,  patient does not have a surgical abdomen or signs of peritonitis. There is no indication of appendicitis, bowel obstruction, bowel perforation, cholecystitis, diverticulitis or pregnancy. Discussed symptomatic treatment and given GI referral with strict instructions for follow-up. I have also discussed reasons to return immediately to the ER.  Patient expresses understanding and agrees with plan.   Filed Vitals:   04/21/14 2100 04/21/14 2115 04/21/14 2130 04/21/14 2232  BP: 120/52 119/53 119/55 126/62  Pulse: 101 101 101 97  Temp:    98.2 F (36.8 C)  TempSrc:    Oral  Resp: 32 33 23 16  SpO2: 94% 93% 95% 96%   Meds given in ED:  Medications  sodium chloride 0.9 % bolus 1,000 mL (0 mLs Intravenous Stopped 04/21/14 2242)  ondansetron (ZOFRAN) injection 4 mg (4 mg Intravenous Given 04/21/14 2057)    Discharge Medication List as of 04/21/2014 10:03 PM    START taking these medications   Details  dicyclomine (BENTYL) 20 MG tablet Take 1 tablet (20 mg total) by mouth 2 (two) times daily., Starting 04/21/2014, Until  Discontinued, Print    metoCLOPramide (REGLAN) 10 MG tablet Take 1 tablet (10 mg total) by mouth every 6 (six) hours., Starting 04/21/2014, Until Discontinued, Print           Britt Bottom, NP 04/22/14 [place], MD 04/27/14 [phone]

## 2014-04-21 NOTE — Discharge Instructions (Signed)
Please follow the directions provided.  Be sure to establish care with the GI doctor for further management of the blood in your stools and the abdominal pain.  Continue to drink oral fluids to stay hydrated.  Take the reglan as directed to help with nausea.  Take the bentyl as directed to help with pain.  Don't hesitate to return for any new, worsening or concerning symptoms.    SEEK IMMEDIATE MEDICAL CARE IF:  You develop severe or prolonged rectal bleeding.  You vomit blood.  You feel weak or faint.  You have a fever.

## 2014-04-21 NOTE — ED Notes (Signed)
Pt requesting pain meds

## 2014-04-21 NOTE — ED Notes (Signed)
Pt. reports chronic generalized abdominal pain for for 3 weeks with emesis and bloody stools , denies fever or chills.

## 2014-04-22 LAB — POC OCCULT BLOOD, ED: Fecal Occult Bld: POSITIVE — AB

## 2014-04-28 ENCOUNTER — Encounter: Payer: Self-pay | Admitting: Physician Assistant

## 2014-05-04 ENCOUNTER — Ambulatory Visit: Payer: Medicare Other | Admitting: Physician Assistant

## 2014-05-13 ENCOUNTER — Encounter: Payer: Self-pay | Admitting: Physician Assistant

## 2014-05-13 ENCOUNTER — Ambulatory Visit: Payer: Medicare Other | Admitting: Physician Assistant

## 2014-05-13 NOTE — Progress Notes (Signed)
Patient ID: Rebecca Moyer, female   DOB: 1986-01-21, 29 y.o.   MRN: [id number] The patient's chart has been reviewed by Cecille Rubin Hvozdovic P.A.-C.  and the recommendations are noted below.   .   Follow-up advised. Schedule patient for next available appointment. Outcome of communication with the patient: letter mailed  Notified [name] at [phone] at Dr. Tamsen Roers (referring Dr.) that she no showed, got a new address from them and she has appt there 05/17/14 so they will also tell her to call us and r/s.

## 2014-06-01 ENCOUNTER — Emergency Department (HOSPITAL_COMMUNITY)
Admission: EM | Admit: 2014-06-01 | Discharge: 2014-06-01 | Disposition: A | Discharge status: Home / Self Care | Payer: Medicare Other | Attending: Emergency Medicine | Admitting: Emergency Medicine

## 2014-06-01 ENCOUNTER — Encounter (HOSPITAL_COMMUNITY): Payer: Self-pay | Admitting: Emergency Medicine

## 2014-06-01 DIAGNOSIS — L02412 Cutaneous abscess of left axilla: Secondary | ICD-10-CM | POA: Diagnosis present

## 2014-06-01 DIAGNOSIS — E119 Type 2 diabetes mellitus without complications: Secondary | ICD-10-CM | POA: Diagnosis not present

## 2014-06-01 DIAGNOSIS — Z8619 Personal history of other infectious and parasitic diseases: Secondary | ICD-10-CM | POA: Insufficient documentation

## 2014-06-01 DIAGNOSIS — Z79899 Other long term (current) drug therapy: Secondary | ICD-10-CM | POA: Insufficient documentation

## 2014-06-01 DIAGNOSIS — E669 Obesity, unspecified: Secondary | ICD-10-CM | POA: Insufficient documentation

## 2014-06-01 DIAGNOSIS — Z8742 Personal history of other diseases of the female genital tract: Secondary | ICD-10-CM | POA: Insufficient documentation

## 2014-06-01 DIAGNOSIS — I10 Essential (primary) hypertension: Secondary | ICD-10-CM | POA: Insufficient documentation

## 2014-06-01 DIAGNOSIS — Z720 Tobacco use: Secondary | ICD-10-CM | POA: Diagnosis not present

## 2014-06-01 DIAGNOSIS — F329 Major depressive disorder, single episode, unspecified: Secondary | ICD-10-CM | POA: Diagnosis not present

## 2014-06-01 DIAGNOSIS — J45909 Unspecified asthma, uncomplicated: Secondary | ICD-10-CM | POA: Diagnosis not present

## 2014-06-01 HISTORY — DX: Type 2 diabetes mellitus without complications: E11.9

## 2014-06-01 LAB — CBG MONITORING, ED: GLUCOSE-CAPILLARY: 231 mg/dL — AB (ref 70–99)

## 2014-06-01 MED ORDER — HYDROCODONE-ACETAMINOPHEN 5-325 MG PO TABS
2.0000 | ORAL_TABLET | Freq: Once | ORAL | Status: AC
  Administered 2014-06-01: 2 via ORAL
  Filled 2014-06-01: qty 2

## 2014-06-01 MED ORDER — LIDOCAINE-EPINEPHRINE 1 %-1:100000 IJ SOLN
10.0000 mL | Freq: Once | INTRAMUSCULAR | Status: AC
  Administered 2014-06-01: 10 mL via INTRADERMAL
  Filled 2014-06-01: qty 1

## 2014-06-01 NOTE — ED Notes (Signed)
Patient here with complaint of left axillary abscess. States it first appeared about 4 days ago and has gradually gotten worse. Appears as raised area; tender to palpation.

## 2014-06-01 NOTE — ED Notes (Signed)
Patient discharged, voiced understanding

## 2014-06-01 NOTE — Discharge Instructions (Signed)
Abscess °An abscess (boil or furuncle) is an infected area on or under the skin. This area is filled with yellowish-white fluid (pus) and other material (debris). °HOME CARE  °· Only take medicines as told by your doctor. °· If you were given antibiotic medicine, take it as directed. Finish the medicine even if you start to feel better. °· If gauze is used, follow your doctor's directions for changing the gauze. °· To avoid spreading the infection: °¨ Keep your abscess covered with a bandage. °¨ Wash your hands well. °¨ Do not share personal care items, towels, or whirlpools with others. °¨ Avoid skin contact with others. °· Keep your skin and clothes clean around the abscess. °· Keep all doctor visits as told. °GET HELP RIGHT AWAY IF:  °· You have more pain, puffiness (swelling), or redness in the wound site. °· You have more fluid or blood coming from the wound site. °· You have muscle aches, chills, or you feel sick. °· You have a fever. °MAKE SURE YOU:  °· Understand these instructions. °· Will watch your condition. °· Will get help right away if you are not doing well or get worse. °Document Released: 08/15/2007 Document Revised: 08/28/2011 Document Reviewed: 05/11/2011 °[employer]® Patient Information ©2015 [employer]. This information is not intended to replace advice given to you by your health care provider. Make sure you discuss any questions you have with your health care provider. ° °

## 2014-06-01 NOTE — ED Provider Notes (Signed)
CSN: [id number]     Arrival date & time 06/01/14  2005 History   First MD Initiated Contact with Patient 06/01/14 2141     Chief Complaint  Patient presents with  . Abscess     (Consider location/radiation/quality/duration/timing/severity/associated sxs/prior Treatment) Patient is a 29 y.o. female presenting with abscess. The history is provided by the patient.  Abscess Location:  Torso Torso abscess location:  L axilla Size:  3 cn Abscess quality: fluctuance, induration and painful   Red streaking: no   Duration:  4 days Progression:  Worsening Pain details:    Quality:  Pressure   Severity:  Moderate   Timing:  Constant   Progression:  Worsening Chronicity:  Recurrent Relieved by:  None tried Worsened by:  Nothing tried Ineffective treatments:  None tried Associated symptoms: no fever, no nausea and no vomiting   Risk factors: prior abscess     Past Medical History  Diagnosis Date  . Asthma 06/07/11  . H/O varicella   . H/O amenorrhea 01/27/04  . Irregular periods/menstrual cycles 05/22/04  . Depression 06/07/11  . Hypertension 06/07/11  . PCOS (polycystic ovarian syndrome) 06/07/11  . Obesity 06/07/11  . Smoker 06/07/11  . Diabetes mellitus without complication    Past Surgical History  Procedure Laterality Date  . Tonsillectomy  06/07/11  . Wisdom tooth extraction  06/07/11  . Adnoids  06/07/11   No family history on file. History  Substance Use Topics  . Smoking status: Current Some Day Smoker -- 0.10 packs/day    Types: Cigarettes  . Smokeless tobacco: Not on file     Comment: pt states she is try to quite as of now  . Alcohol Use: No   OB History    No data available     Review of Systems  Constitutional: Negative for fever.  Gastrointestinal: Negative for nausea and vomiting.  All other systems reviewed and are negative.     Allergies  Penicillins; Naproxen; Triamterene; Ultram; and Fentanyl  Home Medications   Prior to Admission medications    Medication Sig Start Date End Date Taking? Authorizing Provider  albuterol (PROVENTIL HFA;VENTOLIN HFA) 108 (90 BASE) MCG/ACT inhaler Inhale 2 puffs into the lungs every 6 (six) hours as needed for wheezing or shortness of breath. 01/01/14  Yes Ankit Kathrynn Humble, MD  albuterol (PROVENTIL) (2.5 MG/3ML) 0.083% nebulizer solution Take 2.5 mg by nebulization every 8 (eight) hours as needed. Shortness of breath   Yes Historical Provider, MD  ALPRAZolam (XANAX) 1 MG tablet Take 1 mg by mouth 3 (three) times daily. Scheduled doses for anxiety   Yes Historical Provider, MD  atenolol (TENORMIN) 50 MG tablet Take 50 mg by mouth every evening.  01/20/14  Yes Historical Provider, MD  canagliflozin (INVOKANA) 100 MG TABS tablet Take 0.5 tablets (50 mg total) by mouth twice daily. Patient taking differently: Take 100 mg by mouth daily. Take 0.5 tablets (50 mg total) by mouth twice daily. 03/11/14  Yes Margarita Mail, PA-C  diltiazem (CARDIZEM CD) 120 MG 24 hr capsule Take 120 mg by mouth daily. 01/25/14  Yes Historical Provider, MD  fenofibrate (TRICOR) 145 MG tablet Take 145 mg by mouth daily.   Yes Historical Provider, MD  haloperidol (HALDOL) 0.5 MG tablet Take 0.5 mg by mouth daily.   Yes Historical Provider, MD  lansoprazole (PREVACID) 30 MG capsule Take 30 mg by mouth daily at 12 noon.   Yes Historical Provider, MD  Liraglutide (VICTOZA) 18 MG/3ML SOPN Inject 1.8 mg into  the skin daily.    Yes Historical Provider, MD  metFORMIN (GLUCOPHAGE) 500 MG tablet Take 500 mg by mouth daily.   Yes Historical Provider, MD  oxyCODONE-acetaminophen (PERCOCET/ROXICET) 5-325 MG per tablet Take 1 tablet by mouth every 8 (eight) hours as needed for severe pain.   Yes Historical Provider, MD  Alum & Mag Hydroxide-Simeth (MAGIC MOUTHWASH W/LIDOCAINE) SOLN Take 5 mLs by mouth 4 (four) times daily as needed for mouth pain. Patient not taking: Reported on 03/17/2014 03/11/14   Margarita Mail, PA-C  ciprofloxacin (CIPRO) 500 MG  tablet Take 1 tablet (500 mg total) by mouth 2 (two) times daily. One po bid x 5 days Patient not taking: Reported on 06/01/2014 04/18/14   Noland Fordyce, PA-C  dicyclomine (BENTYL) 20 MG tablet Take 1 tablet (20 mg total) by mouth 2 (two) times daily. Patient not taking: Reported on 06/01/2014 04/21/14   Britt Bottom, NP  diphenhydrAMINE (BENADRYL) 25 mg capsule Take 1 capsule (25 mg total) by mouth every 6 (six) hours as needed for itching. Patient not taking: Reported on 03/17/2014 01/01/14   Varney Biles, MD  HYDROcodone-acetaminophen (NORCO/VICODIN) 5-325 MG per tablet Take 1 tablet by mouth every 4 (four) hours as needed. Patient not taking: Reported on 03/11/2014 01/14/14   Larene Pickett, PA-C  ibuprofen (ADVIL,MOTRIN) 600 MG tablet Take 1 tablet (600 mg total) by mouth every 6 (six) hours as needed. Patient not taking: Reported on 06/01/2014 04/18/14   Noland Fordyce, PA-C  metoCLOPramide (REGLAN) 10 MG tablet Take 1 tablet (10 mg total) by mouth every 6 (six) hours. Patient not taking: Reported on 06/01/2014 04/21/14   Britt Bottom, NP  mirtazapine (REMERON) 30 MG tablet Take 30 mg by mouth at bedtime.    Historical Provider, MD  nystatin (MYCOSTATIN) 100000 UNIT/ML suspension Take 5 mLs (500,000 Units total) by mouth 4 (four) times daily. Patient not taking: Reported on 03/17/2014 03/11/14   Margarita Mail, PA-C  phenazopyridine (PYRIDIUM) 200 MG tablet Take 1 tablet (200 mg total) by mouth 3 (three) times daily. Patient not taking: Reported on 06/01/2014 04/18/14   Noland Fordyce, PA-C  predniSONE (DELTASONE) 50 MG tablet Take 1 tablet (50 mg total) by mouth daily. Patient not taking: Reported on 03/17/2014 01/01/14   Varney Biles, MD  sulfamethoxazole-trimethoprim (SEPTRA DS) 800-160 MG per tablet Take 1 tablet by mouth every 12 (twelve) hours. Patient not taking: Reported on 03/17/2014 01/14/14   Larene Pickett, PA-C   BP 147/79 mmHg  Pulse 81  Temp(Src) 98.4 F (36.9 C) (Oral)  Resp  20  SpO2 97%  LMP 05/11/2014 (Approximate) Physical Exam  Constitutional: She is oriented to person, place, and time. She appears well-developed and well-nourished. No distress.  HENT:  Head: Normocephalic and atraumatic.  Eyes: EOM are normal. Pupils are equal, round, and reactive to light.  Cardiovascular: Normal rate, regular rhythm, normal heart sounds and intact distal pulses.   Pulmonary/Chest: Effort normal and breath sounds normal. No respiratory distress.  Musculoskeletal:  3 cm abscess to left axilla with fluctuance and induration. No overlying erythema or red streaking.  Neurological: She is alert and oriented to person, place, and time.  Skin: Skin is warm and dry. No rash noted. She is not diaphoretic.  Psychiatric: She has a normal mood and affect. Her behavior is normal. Judgment and thought content normal.  Nursing note and vitals reviewed.   ED Course  INCISION AND DRAINAGE Date/Time: 06/02/2014 12:20 AM Performed by: Larence Penning Authorized by: Larence Penning Consent:  Verbal consent obtained. Risks and benefits: risks, benefits and alternatives were discussed Consent given by: patient Time out: Immediately prior to procedure a "time out" was called to verify the correct patient, procedure, equipment, support staff and site/side marked as required. Type: abscess Body area: upper extremity (left axilla) Anesthesia: local infiltration Local anesthetic: lidocaine 1% with epinephrine Anesthetic total: 4 ml Patient sedated: no Scalpel size: 11 Incision type: single straight Complexity: simple Drainage: purulent Drainage amount: copious Wound treatment: wound left open Packing material: none Patient tolerance: Patient tolerated the procedure well with no immediate complications   (including critical care time) Labs Review Labs Reviewed  CBG MONITORING, ED - Abnormal; Notable for the following:    Glucose-Capillary 231 (*)    All other components within normal  limits    Imaging Review No results found.   EKG Interpretation None      MDM   Final diagnoses:  Abscess of axilla, left   Abscess drained. No overlying cellulitis. Systemically well with afebrile and vital signs stable. PCP follow-up for recheck discussed. No antibiotics given no signs of overlying cellulitis.  Larence Penning, MD 06/02/14 0022  Elnora Morrison, MD 06/02/14 [phone]

## 2014-06-01 NOTE — ED Notes (Signed)
MD at bedside. 

## 2014-06-04 ENCOUNTER — Encounter (HOSPITAL_COMMUNITY): Payer: Self-pay | Admitting: Emergency Medicine

## 2014-06-04 ENCOUNTER — Emergency Department (HOSPITAL_COMMUNITY)
Admission: EM | Admit: 2014-06-04 | Discharge: 2014-06-04 | Disposition: A | Discharge status: Home / Self Care | Payer: Medicare Other | Attending: Emergency Medicine | Admitting: Emergency Medicine

## 2014-06-04 DIAGNOSIS — Z7952 Long term (current) use of systemic steroids: Secondary | ICD-10-CM | POA: Diagnosis not present

## 2014-06-04 DIAGNOSIS — Z792 Long term (current) use of antibiotics: Secondary | ICD-10-CM | POA: Diagnosis not present

## 2014-06-04 DIAGNOSIS — F329 Major depressive disorder, single episode, unspecified: Secondary | ICD-10-CM | POA: Insufficient documentation

## 2014-06-04 DIAGNOSIS — Z79899 Other long term (current) drug therapy: Secondary | ICD-10-CM | POA: Insufficient documentation

## 2014-06-04 DIAGNOSIS — Z720 Tobacco use: Secondary | ICD-10-CM | POA: Insufficient documentation

## 2014-06-04 DIAGNOSIS — J45909 Unspecified asthma, uncomplicated: Secondary | ICD-10-CM | POA: Insufficient documentation

## 2014-06-04 DIAGNOSIS — E119 Type 2 diabetes mellitus without complications: Secondary | ICD-10-CM | POA: Diagnosis not present

## 2014-06-04 DIAGNOSIS — I10 Essential (primary) hypertension: Secondary | ICD-10-CM | POA: Insufficient documentation

## 2014-06-04 DIAGNOSIS — Z880 Allergy status to penicillin: Secondary | ICD-10-CM | POA: Insufficient documentation

## 2014-06-04 DIAGNOSIS — Z8742 Personal history of other diseases of the female genital tract: Secondary | ICD-10-CM | POA: Diagnosis not present

## 2014-06-04 DIAGNOSIS — Z8619 Personal history of other infectious and parasitic diseases: Secondary | ICD-10-CM | POA: Diagnosis not present

## 2014-06-04 DIAGNOSIS — L03112 Cellulitis of left axilla: Secondary | ICD-10-CM | POA: Insufficient documentation

## 2014-06-04 DIAGNOSIS — E669 Obesity, unspecified: Secondary | ICD-10-CM | POA: Insufficient documentation

## 2014-06-04 DIAGNOSIS — L02412 Cutaneous abscess of left axilla: Secondary | ICD-10-CM | POA: Diagnosis present

## 2014-06-04 LAB — BASIC METABOLIC PANEL
Anion gap: 11 (ref 5–15)
BUN: 10 mg/dL (ref 6–23)
CALCIUM: 8.7 mg/dL (ref 8.4–10.5)
CO2: 19 mmol/L (ref 19–32)
Chloride: 105 mmol/L (ref 96–112)
Creatinine, Ser: 0.6 mg/dL (ref 0.50–1.10)
GFR calc Af Amer: >90 mL/min (ref >90)
GFR calc non Af Amer: >90 mL/min (ref >90)
GLUCOSE: 235 mg/dL — AB (ref 70–99)
Potassium: 4.4 mmol/L (ref 3.5–5.1)
SODIUM: 135 mmol/L (ref 135–145)

## 2014-06-04 LAB — CBC
HCT: 41.8 % (ref 36.0–46.0)
Hemoglobin: 15.1 g/dL — ABNORMAL HIGH (ref 12.0–15.0)
MCH: 31.2 pg (ref 26.0–34.0)
MCHC: 36.1 g/dL — AB (ref 30.0–36.0)
MCV: 86.4 fL (ref 78.0–100.0)
Platelets: 283 10*3/uL (ref 150–400)
RBC: 4.84 MIL/uL (ref 3.87–5.11)
RDW: 12.6 % (ref 11.5–15.5)
WBC: 7.8 10*3/uL (ref 4.0–10.5)

## 2014-06-04 LAB — I-STAT TROPONIN, ED: Troponin i, poc: 0 ng/mL (ref 0.00–0.08)

## 2014-06-04 MED ORDER — SULFAMETHOXAZOLE-TRIMETHOPRIM 800-160 MG PO TABS: 2.0000 | ORAL_TABLET | Freq: Two times a day (BID) | ORAL | Status: DC

## 2014-06-04 MED ORDER — OXYCODONE-ACETAMINOPHEN 5-325 MG PO TABS: 1.0000 | ORAL_TABLET | Freq: Four times a day (QID) | ORAL | Status: DC | PRN

## 2014-06-04 MED ORDER — LIDOCAINE-EPINEPHRINE (PF) 2 %-1:200000 IJ SOLN: 10.0000 mL | Freq: Once | INTRAMUSCULAR | Status: DC

## 2014-06-04 MED ORDER — MORPHINE SULFATE 4 MG/ML IJ SOLN
4.0000 mg | Freq: Once | INTRAMUSCULAR | Status: AC
  Administered 2014-06-04: 4 mg via INTRAVENOUS
  Filled 2014-06-04: qty 1

## 2014-06-04 MED ORDER — LIDOCAINE-EPINEPHRINE (PF) 2 %-1:200000 IJ SOLN
10.0000 mL | Freq: Once | INTRAMUSCULAR | Status: DC
  Filled 2014-06-04: qty 20

## 2014-06-04 NOTE — Discharge Instructions (Signed)

## 2014-06-04 NOTE — ED Provider Notes (Signed)
CSN: [id number]     Arrival date & time 06/04/14  1414 History   First MD Initiated Contact with Patient 06/04/14 1415     Chief Complaint  Patient presents with  . Abscess     (Consider location/radiation/quality/duration/timing/severity/associated sxs/prior Treatment) Patient is a 29 y.o. female presenting with abscess. The history is provided by the patient.  Abscess Associated symptoms: fever   Associated symptoms: no headaches    patient presents with an abscess in her left axilla. States she was seen at [facility] and drained on Tuesday. Today is Friday. She states that is gotten worse. States the pain has increased. She states it was foul-smelling. States she was not given antibiotics and she should've been given them because she was diabetic. States she's not having fevers and chills. Has had abscesses in the past.   Past Medical History  Diagnosis Date  . Asthma 06/07/11  . H/O varicella   . H/O amenorrhea 01/27/04  . Irregular periods/menstrual cycles 05/22/04  . Depression 06/07/11  . Hypertension 06/07/11  . PCOS (polycystic ovarian syndrome) 06/07/11  . Obesity 06/07/11  . Smoker 06/07/11  . Diabetes mellitus without complication    Past Surgical History  Procedure Laterality Date  . Tonsillectomy  06/07/11  . Wisdom tooth extraction  06/07/11  . Adnoids  06/07/11   History reviewed. No pertinent family history. History  Substance Use Topics  . Smoking status: Current Some Day Smoker -- 0.10 packs/day    Types: Cigarettes  . Smokeless tobacco: Not on file     Comment: pt states she is try to quite as of now  . Alcohol Use: No   OB History    No data available     Review of Systems  Constitutional: Positive for fever and chills. Negative for activity change and appetite change.  Respiratory: Negative for choking.   Cardiovascular: Positive for chest pain (pain in left chest radiation from axilla.).  Gastrointestinal: Negative for abdominal pain.   Genitourinary: Negative for flank pain.  Musculoskeletal: Negative for back pain.  Skin: Positive for wound.  Neurological: Negative for headaches.  Hematological: Negative for adenopathy.  Psychiatric/Behavioral: Negative for behavioral problems.      Allergies  Penicillins; Naproxen; Triamterene; Ultram; and Fentanyl  Home Medications   Prior to Admission medications   Medication Sig Start Date End Date Taking? Authorizing Provider  albuterol (PROVENTIL HFA;VENTOLIN HFA) 108 (90 BASE) MCG/ACT inhaler Inhale 2 puffs into the lungs every 6 (six) hours as needed for wheezing or shortness of breath. 01/01/14  Yes Ankit Kathrynn Humble, MD  albuterol (PROVENTIL) (2.5 MG/3ML) 0.083% nebulizer solution Take 2.5 mg by nebulization every 8 (eight) hours as needed. Shortness of breath   Yes Historical Provider, MD  ALPRAZolam (XANAX) 1 MG tablet Take 1 mg by mouth 3 (three) times daily. Scheduled doses for anxiety   Yes Historical Provider, MD  atenolol (TENORMIN) 50 MG tablet Take 50 mg by mouth every evening.  01/20/14  Yes Historical Provider, MD  diltiazem (CARDIZEM CD) 120 MG 24 hr capsule Take 120 mg by mouth daily. 01/25/14  Yes Historical Provider, MD  haloperidol (HALDOL) 0.5 MG tablet Take 0.5 mg by mouth 2 (two) times daily.    Yes Historical Provider, MD  Liraglutide (VICTOZA) 18 MG/3ML SOPN Inject 1.8 mg into the skin daily.    Yes Historical Provider, MD  metFORMIN (GLUMETZA) 1000 MG (MOD) 24 hr tablet Take 1,000 mg by mouth daily with breakfast.   Yes Historical Provider, MD  mirtazapine (REMERON) 30 MG tablet Take 30 mg by mouth at bedtime.   Yes Historical Provider, MD  Alum & Mag Hydroxide-Simeth (MAGIC MOUTHWASH W/LIDOCAINE) SOLN Take 5 mLs by mouth 4 (four) times daily as needed for mouth pain. Patient not taking: Reported on 03/17/2014 03/11/14   Margarita Mail, PA-C  canagliflozin [facility]) 100 MG TABS tablet Take 0.5 tablets (50 mg total) by mouth twice daily. Patient taking  differently: Take 100 mg by mouth daily. Take 0.5 tablets (50 mg total) by mouth twice daily. 03/11/14   Margarita Mail, PA-C  ciprofloxacin (CIPRO) 500 MG tablet Take 1 tablet (500 mg total) by mouth 2 (two) times daily. One po bid x 5 days Patient not taking: Reported on 06/01/2014 04/18/14   Noland Fordyce, PA-C  dicyclomine (BENTYL) 20 MG tablet Take 1 tablet (20 mg total) by mouth 2 (two) times daily. Patient not taking: Reported on 06/01/2014 04/21/14   Britt Bottom, NP  diphenhydrAMINE (BENADRYL) 25 mg capsule Take 1 capsule (25 mg total) by mouth every 6 (six) hours as needed for itching. Patient not taking: Reported on 03/17/2014 01/01/14   Varney Biles, MD  fenofibrate (TRICOR) 145 MG tablet Take 145 mg by mouth daily.    Historical Provider, MD  HYDROcodone-acetaminophen (NORCO/VICODIN) 5-325 MG per tablet Take 1 tablet by mouth every 4 (four) hours as needed. Patient not taking: Reported on 03/11/2014 01/14/14   Larene Pickett, PA-C  ibuprofen (ADVIL,MOTRIN) 600 MG tablet Take 1 tablet (600 mg total) by mouth every 6 (six) hours as needed. Patient not taking: Reported on 06/01/2014 04/18/14   Noland Fordyce, PA-C  lansoprazole (PREVACID) 30 MG capsule Take 30 mg by mouth daily at 12 noon.    Historical Provider, MD  metFORMIN (GLUCOPHAGE) 500 MG tablet Take 500 mg by mouth daily.    Historical Provider, MD  metoCLOPramide (REGLAN) 10 MG tablet Take 1 tablet (10 mg total) by mouth every 6 (six) hours. Patient not taking: Reported on 06/01/2014 04/21/14   Britt Bottom, NP  nystatin (MYCOSTATIN) 100000 UNIT/ML suspension Take 5 mLs (500,000 Units total) by mouth 4 (four) times daily. Patient not taking: Reported on 03/17/2014 03/11/14   Margarita Mail, PA-C  oxyCODONE-acetaminophen (PERCOCET/ROXICET) 5-325 MG per tablet Take 1-2 tablets by mouth every 6 (six) hours as needed for severe pain. 06/04/14   Davonna Belling, MD  phenazopyridine (PYRIDIUM) 200 MG tablet Take 1 tablet (200 mg total)  by mouth 3 (three) times daily. Patient not taking: Reported on 06/01/2014 04/18/14   Noland Fordyce, PA-C  predniSONE (DELTASONE) 50 MG tablet Take 1 tablet (50 mg total) by mouth daily. Patient not taking: Reported on 03/17/2014 01/01/14   Varney Biles, MD  sulfamethoxazole-trimethoprim (BACTRIM DS,SEPTRA DS) 800-160 MG per tablet Take 2 tablets by mouth 2 (two) times daily. 06/04/14   Davonna Belling, MD   BP 124/67 mmHg  Pulse 83  Temp(Src) 98 F (36.7 C) (Oral)  Resp 16  SpO2 95%  LMP 05/11/2014 (Approximate) Physical Exam  Constitutional: She is oriented to person, place, and time. She appears well-developed.  HENT:  Head: Atraumatic.  Eyes: Pupils are equal, round, and reactive to light.  Cardiovascular: Normal rate and regular rhythm.   Pulmonary/Chest: Effort normal.  Abdominal: Soft.  Musculoskeletal: Normal range of motion.  Neurological: She is alert and oriented to person, place, and time.  Skin: Skin is warm.  Tender indurated area to left axilla. No drainage. Some firmness along site of recent drainage. Wound closed.  ED Course  Procedures (including critical care time) Labs Review Labs Reviewed  BASIC METABOLIC PANEL - Abnormal; Notable for the following:    Glucose, Bld 235 (*)    All other components within normal limits  CBC - Abnormal; Notable for the following:    Hemoglobin 15.1 (*)    MCHC 36.1 (*)    All other components within normal limits  I-STAT TROPOININ, ED    Imaging Review No results found.   EKG Interpretation   Date/Time:  Friday June 04 2014 14:50:07 EDT Ventricular Rate:  87 PR Interval:  155 QRS Duration: 103 QT Interval:  386 QTC Calculation: 464 R Axis:   50 Text Interpretation:  Sinus rhythm No significant change since last  tracing Confirmed by Khaleed Holan  MD, Ovid Curd [phone]) on 06/04/2014 3:50:24 PM      MDM   Final diagnoses:  Cellulitis of left axilla    Patient with possible abscess in left axilla. Ultrasound  showed possible small fluid collection but incision was done and showed no fluid. White count is good and she does not have fever however she is diabetic. Will give antibiotics and have follow-up in 2 days for recheck if does not improve. Does not appear to be drainable at this time.  INCISION AND DRAINAGE Performed by: Mackie Pai. Consent: Verbal consent obtained. Risks and benefits: risks, benefits and alternatives were discussed Type: abscess  Body area: Left axilla  Anesthesia: local infiltration  Incision was made with a scalpel.  Local anesthetic: lidocaine 2 % with epinephrine  Anesthetic total: 3 ml  Complexity: Simple Blunt dissection to break up loculations  Drainage: None    no packing done since no purulent drainage and since its location would likely close up with her putting her arm back down.   Patient tolerance: Patient tolerated the procedure well with no immediate complications.      Davonna Belling, MD 06/04/14 [phone]

## 2014-06-04 NOTE — ED Notes (Signed)
Pt seen at [facility] to have abscess under left arm drained, pt states she was not prescribed antibiotics, pt states abscess is now infected.

## 2014-06-04 NOTE — ED Notes (Signed)
Bed: WTR7 Expected date:  Expected time:  Means of arrival:  Comments: EMS

## 2014-07-19 ENCOUNTER — Encounter (HOSPITAL_COMMUNITY): Payer: Self-pay | Admitting: Emergency Medicine

## 2014-07-19 DIAGNOSIS — I10 Essential (primary) hypertension: Secondary | ICD-10-CM | POA: Diagnosis not present

## 2014-07-19 DIAGNOSIS — Z8742 Personal history of other diseases of the female genital tract: Secondary | ICD-10-CM | POA: Diagnosis not present

## 2014-07-19 DIAGNOSIS — R1011 Right upper quadrant pain: Secondary | ICD-10-CM | POA: Insufficient documentation

## 2014-07-19 DIAGNOSIS — Z79899 Other long term (current) drug therapy: Secondary | ICD-10-CM | POA: Diagnosis not present

## 2014-07-19 DIAGNOSIS — E669 Obesity, unspecified: Secondary | ICD-10-CM | POA: Insufficient documentation

## 2014-07-19 DIAGNOSIS — Z3202 Encounter for pregnancy test, result negative: Secondary | ICD-10-CM | POA: Diagnosis not present

## 2014-07-19 DIAGNOSIS — F329 Major depressive disorder, single episode, unspecified: Secondary | ICD-10-CM | POA: Insufficient documentation

## 2014-07-19 DIAGNOSIS — Z8619 Personal history of other infectious and parasitic diseases: Secondary | ICD-10-CM | POA: Insufficient documentation

## 2014-07-19 DIAGNOSIS — Z720 Tobacco use: Secondary | ICD-10-CM | POA: Insufficient documentation

## 2014-07-19 DIAGNOSIS — Z880 Allergy status to penicillin: Secondary | ICD-10-CM | POA: Diagnosis not present

## 2014-07-19 DIAGNOSIS — J45909 Unspecified asthma, uncomplicated: Secondary | ICD-10-CM | POA: Insufficient documentation

## 2014-07-19 DIAGNOSIS — E119 Type 2 diabetes mellitus without complications: Secondary | ICD-10-CM | POA: Insufficient documentation

## 2014-07-19 LAB — CBC WITH DIFFERENTIAL/PLATELET
Basophils Absolute: 0 10*3/uL (ref 0.0–0.1)
Basophils Relative: 0 % (ref 0–1)
EOS PCT: 3 % (ref 0–5)
Eosinophils Absolute: 0.2 10*3/uL (ref 0.0–0.7)
HCT: 41.5 % (ref 36.0–46.0)
Hemoglobin: 15.1 g/dL — ABNORMAL HIGH (ref 12.0–15.0)
LYMPHS ABS: 3.1 10*3/uL (ref 0.7–4.0)
Lymphocytes Relative: 38 % (ref 12–46)
MCH: 30.9 pg (ref 26.0–34.0)
MCHC: 36.4 g/dL — ABNORMAL HIGH (ref 30.0–36.0)
MCV: 85 fL (ref 78.0–100.0)
Monocytes Absolute: 0.3 10*3/uL (ref 0.1–1.0)
Monocytes Relative: 3 % (ref 3–12)
Neutro Abs: 4.6 10*3/uL (ref 1.7–7.7)
Neutrophils Relative %: 56 % (ref 43–77)
PLATELETS: 269 10*3/uL (ref 150–400)
RBC: 4.88 MIL/uL (ref 3.87–5.11)
RDW: 12.9 % (ref 11.5–15.5)
WBC: 8.2 10*3/uL (ref 4.0–10.5)

## 2014-07-19 LAB — URINALYSIS, ROUTINE W REFLEX MICROSCOPIC
Bilirubin Urine: NEGATIVE
Glucose, UA: >1000 mg/dL — AB
HGB URINE DIPSTICK: NEGATIVE
Ketones, ur: 15 mg/dL — AB
Leukocytes, UA: NEGATIVE
Nitrite: NEGATIVE
PH: 5.5 (ref 5.0–8.0)
PROTEIN: NEGATIVE mg/dL
Specific Gravity, Urine: 1.039 — ABNORMAL HIGH (ref 1.005–1.030)
Urobilinogen, UA: 0.2 mg/dL (ref 0.0–1.0)

## 2014-07-19 LAB — URINE MICROSCOPIC-ADD ON

## 2014-07-19 LAB — COMPREHENSIVE METABOLIC PANEL
ALBUMIN: 3.7 g/dL (ref 3.5–5.0)
ALT: 60 U/L — ABNORMAL HIGH (ref 14–54)
ANION GAP: 18 — AB (ref 5–15)
AST: 69 U/L — AB (ref 15–41)
Alkaline Phosphatase: 58 U/L (ref 38–126)
BUN: 10 mg/dL (ref 6–20)
CO2: 16 mmol/L — ABNORMAL LOW (ref 22–32)
CREATININE: 0.73 mg/dL (ref 0.44–1.00)
Calcium: 9.6 mg/dL (ref 8.9–10.3)
Chloride: 101 mmol/L (ref 101–111)
GFR calc Af Amer: >60 mL/min (ref >60)
GFR calc non Af Amer: >60 mL/min (ref >60)
Glucose, Bld: 264 mg/dL — ABNORMAL HIGH (ref 70–99)
POTASSIUM: 4.3 mmol/L (ref 3.5–5.1)
Sodium: 135 mmol/L (ref 135–145)
TOTAL PROTEIN: 6.6 g/dL (ref 6.5–8.1)
Total Bilirubin: 1.1 mg/dL (ref 0.3–1.2)

## 2014-07-19 LAB — CBG MONITORING, ED: GLUCOSE-CAPILLARY: 256 mg/dL — AB (ref 70–99)

## 2014-07-19 LAB — LIPASE, BLOOD: LIPASE: 25 U/L (ref 22–51)

## 2014-07-19 LAB — POC URINE PREG, ED: Preg Test, Ur: NEGATIVE

## 2014-07-19 MED ORDER — ONDANSETRON 4 MG PO TBDP
8.0000 mg | ORAL_TABLET | Freq: Once | ORAL | Status: AC
  Administered 2014-07-19: 8 mg via ORAL

## 2014-07-19 MED ORDER — ONDANSETRON 4 MG PO TBDP
ORAL_TABLET | ORAL | Status: AC
  Filled 2014-07-19: qty 2

## 2014-07-19 NOTE — ED Notes (Signed)
Pt. reports generalized abdominal pain with dizziness onset last week , bloody stools today , mild nausea / no diarrhea . Denies fever or chills.

## 2014-07-20 ENCOUNTER — Emergency Department (HOSPITAL_COMMUNITY): Payer: Medicare Other

## 2014-07-20 ENCOUNTER — Emergency Department (HOSPITAL_COMMUNITY)
Admission: EM | Admit: 2014-07-20 | Discharge: 2014-07-20 | Disposition: A | Discharge status: Home / Self Care | Payer: Medicare Other | Attending: Emergency Medicine | Admitting: Emergency Medicine

## 2014-07-20 DIAGNOSIS — R1011 Right upper quadrant pain: Secondary | ICD-10-CM | POA: Diagnosis not present

## 2014-07-20 MED ORDER — METOCLOPRAMIDE HCL 5 MG/ML IJ SOLN
10.0000 mg | Freq: Once | INTRAMUSCULAR | Status: AC
  Administered 2014-07-20: 10 mg via INTRAVENOUS
  Filled 2014-07-20: qty 2

## 2014-07-20 MED ORDER — MORPHINE SULFATE 4 MG/ML IJ SOLN
4.0000 mg | Freq: Once | INTRAMUSCULAR | Status: AC
  Administered 2014-07-20: 4 mg via INTRAVENOUS
  Filled 2014-07-20: qty 1

## 2014-07-20 MED ORDER — SODIUM CHLORIDE 0.9 % IV BOLUS (SEPSIS)
1000.0000 mL | Freq: Once | INTRAVENOUS | Status: AC
  Administered 2014-07-20: 1000 mL via INTRAVENOUS

## 2014-07-20 NOTE — Discharge Instructions (Signed)
Abdominal Pain Ms. Spallone, your ultrasound does not show any cause for your abdominal pain.  See GI within 3 days for management of your pain.  If any symptoms worsen, come back to the ED immediately.  Thank you. Many things can cause belly (abdominal) pain. Most times, the belly pain is not dangerous. Many cases of belly pain can be watched and treated at home. HOME CARE   Do not take medicines that help you go poop (laxatives) unless told to by your doctor.  Only take medicine as told by your doctor.  Eat or drink as told by your doctor. Your doctor will tell you if you should be on a special diet. GET HELP IF:  You do not know what is causing your belly pain.  You have belly pain while you are sick to your stomach (nauseous) or have runny poop (diarrhea).  You have pain while you pee or poop.  Your belly pain wakes you up at night.  You have belly pain that gets worse or better when you eat.  You have belly pain that gets worse when you eat fatty foods.  You have a fever. GET HELP RIGHT AWAY IF:   The pain does not go away within 2 hours.  You keep throwing up (vomiting).  The pain changes and is only in the right or left part of the belly.  You have bloody or tarry looking poop. MAKE SURE YOU:   Understand these instructions.  Will watch your condition.  Will get help right away if you are not doing well or get worse. Document Released: 08/15/2007 Document Revised: 03/03/2013 Document Reviewed: 11/05/2012 [facility] Patient Information 2015 [place], [place]. This information is not intended to replace advice given to you by your health care provider. Make sure you discuss any questions you have with your health care provider.

## 2014-07-20 NOTE — ED Notes (Signed)
Pt c/o generalized abdominal pain, worsen in RUQ x 1 week associated with nausea. Reports pain increases after eating. Pt also states difficulty swallowing whole foods due to food "food feeling like it's stuck in my throat." Has been relying on soups and liquids.

## 2014-07-20 NOTE — ED Provider Notes (Signed)
CSN: [id number]     Arrival date & time 07/19/14  1935 History  This chart was scribed for Rebecca Balls, MD by Chester Holstein, ED Scribe. This patient was seen in room D34C/D34C and the patient's care was started at 1:11 AM.      Chief Complaint  Patient presents with  . Abdominal Pain     Patient is a 29 y.o. female presenting with abdominal pain. The history is provided by the patient. No language interpreter was used.  Abdominal Pain  HPI Comments: Rebecca Moyer is a 29 y.o. female with PMHx of hiatal hernia, HTN, PCOS, and DM who presents to the Emergency Department complaining of epigastric abdominal pain with onset several months ago, worsening this week. Pt denies pain radiation. She states eating whole foods aggravates the pain. She states when she tries to eat she starts choking. Pt notes associated hematochezia with onset 2 days ago and intermittent fever, chills, diarrhea, nausea, and vomiting. She denies any alleviating factors. She denies any recent changes in medication. Pt takes Prevacid. Pt denies dysuria and hematuria.   Past Medical History  Diagnosis Date  . Asthma 06/07/11  . H/O varicella   . H/O amenorrhea 01/27/04  . Irregular periods/menstrual cycles 05/22/04  . Depression 06/07/11  . Hypertension 06/07/11  . PCOS (polycystic ovarian syndrome) 06/07/11  . Obesity 06/07/11  . Smoker 06/07/11  . Diabetes mellitus without complication    Past Surgical History  Procedure Laterality Date  . Tonsillectomy  06/07/11  . Wisdom tooth extraction  06/07/11  . Adnoids  06/07/11   No family history on file. History  Substance Use Topics  . Smoking status: Current Some Day Smoker -- 0.10 packs/day    Types: Cigarettes  . Smokeless tobacco: Not on file     Comment: pt states she is try to quite as of now  . Alcohol Use: No   OB History    No data available     Review of Systems  Gastrointestinal: Positive for abdominal pain.  A complete 10 system review of systems was  obtained and all systems are negative except as noted in the HPI and PMH.      Allergies  Penicillins; Naproxen; Triamterene; Ultram; and Fentanyl  Home Medications   Prior to Admission medications   Medication Sig Start Date End Date Taking? Authorizing Provider  albuterol (PROVENTIL HFA;VENTOLIN HFA) 108 (90 BASE) MCG/ACT inhaler Inhale 2 puffs into the lungs every 6 (six) hours as needed for wheezing or shortness of breath. 01/01/14  Yes Ankit Kathrynn Humble, MD  albuterol (PROVENTIL) (2.5 MG/3ML) 0.083% nebulizer solution Take 2.5 mg by nebulization every 8 (eight) hours as needed. Shortness of breath   Yes Historical Provider, MD  ALPRAZolam (XANAX) 1 MG tablet Take 1 mg by mouth 3 (three) times daily. Scheduled doses for anxiety   Yes Historical Provider, MD  atenolol (TENORMIN) 50 MG tablet Take 50 mg by mouth every evening.  01/20/14  Yes Historical Provider, MD  canagliflozin (INVOKANA) 100 MG TABS tablet Take 0.5 tablets (50 mg total) by mouth twice daily. Patient taking differently: Take 100 mg by mouth daily. Take 0.5 tablets (50 mg total) by mouth twice daily. 03/11/14  Yes Margarita Mail, PA-C  diltiazem (CARDIZEM CD) 120 MG 24 hr capsule Take 120 mg by mouth daily. 01/25/14  Yes Historical Provider, MD  fenofibrate (TRICOR) 145 MG tablet Take 145 mg by mouth daily.   Yes Historical Provider, MD  haloperidol (HALDOL) 0.5 MG tablet Take  0.5 mg by mouth 2 (two) times daily.    Yes Historical Provider, MD  lansoprazole (PREVACID) 30 MG capsule Take 30 mg by mouth daily at 12 noon.   Yes Historical Provider, MD  Liraglutide (VICTOZA) 18 MG/3ML SOPN Inject 1.8 mg into the skin daily.    Yes Historical Provider, MD  metFORMIN (GLUMETZA) 1000 MG (MOD) 24 hr tablet Take 1,000 mg by mouth daily with breakfast.   Yes Historical Provider, MD  mirtazapine (REMERON) 30 MG tablet Take 30 mg by mouth at bedtime.   Yes Historical Provider, MD  Alum & Mag Hydroxide-Simeth (MAGIC MOUTHWASH W/LIDOCAINE)  SOLN Take 5 mLs by mouth 4 (four) times daily as needed for mouth pain. Patient not taking: Reported on 03/17/2014 03/11/14   Margarita Mail, PA-C  ciprofloxacin (CIPRO) 500 MG tablet Take 1 tablet (500 mg total) by mouth 2 (two) times daily. One po bid x 5 days Patient not taking: Reported on 06/01/2014 04/18/14   Noland Fordyce, PA-C  dicyclomine (BENTYL) 20 MG tablet Take 1 tablet (20 mg total) by mouth 2 (two) times daily. Patient not taking: Reported on 06/01/2014 04/21/14   Britt Bottom, NP  diphenhydrAMINE (BENADRYL) 25 mg capsule Take 1 capsule (25 mg total) by mouth every 6 (six) hours as needed for itching. Patient not taking: Reported on 03/17/2014 01/01/14   Varney Biles, MD  HYDROcodone-acetaminophen (NORCO/VICODIN) 5-325 MG per tablet Take 1 tablet by mouth every 4 (four) hours as needed. Patient not taking: Reported on 03/11/2014 01/14/14   Larene Pickett, PA-C  ibuprofen (ADVIL,MOTRIN) 600 MG tablet Take 1 tablet (600 mg total) by mouth every 6 (six) hours as needed. Patient not taking: Reported on 06/01/2014 04/18/14   Noland Fordyce, PA-C  metoCLOPramide (REGLAN) 10 MG tablet Take 1 tablet (10 mg total) by mouth every 6 (six) hours. Patient not taking: Reported on 06/01/2014 04/21/14   Britt Bottom, NP  nystatin (MYCOSTATIN) 100000 UNIT/ML suspension Take 5 mLs (500,000 Units total) by mouth 4 (four) times daily. Patient not taking: Reported on 03/17/2014 03/11/14   Margarita Mail, PA-C  oxyCODONE-acetaminophen (PERCOCET/ROXICET) 5-325 MG per tablet Take 1-2 tablets by mouth every 6 (six) hours as needed for severe pain. Patient not taking: Reported on 07/19/2014 06/04/14   Davonna Belling, MD  phenazopyridine (PYRIDIUM) 200 MG tablet Take 1 tablet (200 mg total) by mouth 3 (three) times daily. Patient not taking: Reported on 06/01/2014 04/18/14   Noland Fordyce, PA-C  predniSONE (DELTASONE) 50 MG tablet Take 1 tablet (50 mg total) by mouth daily. Patient not taking: Reported on 03/17/2014  01/01/14   Varney Biles, MD  sulfamethoxazole-trimethoprim (BACTRIM DS,SEPTRA DS) 800-160 MG per tablet Take 2 tablets by mouth 2 (two) times daily. Patient not taking: Reported on 07/19/2014 06/04/14   Davonna Belling, MD   BP 168/85 mmHg  Pulse 100  Temp(Src) 97.6 F (36.4 C) (Oral)  Resp 20  Ht 5\' 8"  (1.727 m)  Wt 268 lb (121.564 kg)  BMI 40.76 kg/m2  SpO2 98%  LMP 06/28/2014 Physical Exam  Constitutional: She is oriented to person, place, and time. She appears well-developed and well-nourished. No distress.  HENT:  Head: Normocephalic and atraumatic.  Nose: Nose normal.  Mouth/Throat: Oropharynx is clear and moist. No oropharyngeal exudate.  Eyes: Conjunctivae and EOM are normal. Pupils are equal, round, and reactive to light. No scleral icterus.  Neck: Normal range of motion. Neck supple. No JVD present. No tracheal deviation present. No thyromegaly present.  Cardiovascular: Normal rate, regular rhythm  and normal heart sounds.  Exam reveals no gallop and no friction rub.   No murmur heard. Pulmonary/Chest: Effort normal and breath sounds normal. No respiratory distress. She has no wheezes. She exhibits no tenderness.  Abdominal: Soft. Bowel sounds are normal. She exhibits no distension and no mass. There is no tenderness. There is no rebound and no guarding.  Musculoskeletal: Normal range of motion. She exhibits no edema or tenderness.  Lymphadenopathy:    She has no cervical adenopathy.  Neurological: She is alert and oriented to person, place, and time. No cranial nerve deficit. She exhibits normal muscle tone.  Skin: Skin is warm and dry. No rash noted. No erythema. No pallor.  Nursing note and vitals reviewed.   ED Course  Procedures (including critical care time) DIAGNOSTIC STUDIES: Oxygen Saturation is 98% on room air, normal by my interpretation.    COORDINATION OF CARE: 1:16 AM Discussed treatment plan with patient at beside, the patient agrees with the plan and  has no further questions at this time.   Labs Review Labs Reviewed  CBC WITH DIFFERENTIAL/PLATELET - Abnormal; Notable for the following:    Hemoglobin 15.1 (*)    MCHC 36.4 (*)    All other components within normal limits  COMPREHENSIVE METABOLIC PANEL - Abnormal; Notable for the following:    CO2 16 (*)    Glucose, Bld 264 (*)    AST 69 (*)    ALT 60 (*)    Anion gap 18 (*)    All other components within normal limits  URINALYSIS, ROUTINE W REFLEX MICROSCOPIC - Abnormal; Notable for the following:    Specific Gravity, Urine 1.039 (*)    Glucose, UA >1000 (*)    Ketones, ur 15 (*)    All other components within normal limits  URINE MICROSCOPIC-ADD ON - Abnormal; Notable for the following:    Squamous Epithelial / LPF FEW (*)    All other components within normal limits  CBG MONITORING, ED - Abnormal; Notable for the following:    Glucose-Capillary 256 (*)    All other components within normal limits  LIPASE, BLOOD  POC URINE PREG, ED    Imaging Review US Abdomen Limited Ruq  07/20/2014   CLINICAL DATA:  RIGHT upper quadrant pain. History of diabetes, hypertension, obesity, polycystic ovarian disease.  EXAM: US ABDOMEN LIMITED - RIGHT UPPER QUADRANT  COMPARISON:  None.  FINDINGS: Gallbladder:  No gallstones or wall thickening visualized. No sonographic Murphy sign noted.  Common bile duct:  Diameter: 3 mm  Liver:  No focal lesion identified. Increased parenchymal echogenicity. Hepatopetal portal vein.  IMPRESSION: Hepatic steatosis without acute RIGHT upper quadrant process.   Electronically Signed   By: Elon Alas   On: 07/20/2014 02:09     EKG Interpretation None      MDM   Final diagnoses:  None   patient since emergency department for right upper quadrant abdominal pain. She states she's had this pain for several months. She was seen in primary care physicians as well as gastroenterologist without any known diagnosis. She states her last imaging was ultrasound  performed here many months ago that was negative. Laboratory studies only reveal a mild transaminitis. Physical exam does show tenderness in the right upper quadrant. I have very low suspicion for significant liver or biliary pathology however will obtain repeat ultrasound to assess for any infection. Patient was given morphine and Reglan for symptomatic control.  Ultrasound does not show any acute process in the right  upper quadrant. Patient will be advised to see GI for continued management. She appears well in no acute distress. Her vital signs were within her normal limits and she is safe for discharge.  I personally performed the services described in this documentation, which was scribed in my presence. The recorded information has been reviewed and is accurate.   Rebecca Balls, MD 07/20/14 [phone]

## 2014-07-20 NOTE — ED Notes (Signed)
Patient back from [place]. Placed back on monitor

## 2014-08-17 ENCOUNTER — Other Ambulatory Visit (HOSPITAL_COMMUNITY): Payer: Self-pay | Admitting: Family Medicine

## 2014-08-17 DIAGNOSIS — K209 Esophagitis, unspecified without bleeding: Secondary | ICD-10-CM

## 2014-08-17 DIAGNOSIS — R1310 Dysphagia, unspecified: Secondary | ICD-10-CM

## 2014-08-21 ENCOUNTER — Encounter (HOSPITAL_COMMUNITY): Payer: Self-pay | Admitting: Emergency Medicine

## 2014-08-21 ENCOUNTER — Emergency Department (INDEPENDENT_AMBULATORY_CARE_PROVIDER_SITE_OTHER)
Admission: EM | Admit: 2014-08-21 | Discharge: 2014-08-21 | Disposition: A | Discharge status: Home / Self Care | Payer: Medicare Other | Source: Home / Self Care | Attending: Family Medicine | Admitting: Family Medicine

## 2014-08-21 DIAGNOSIS — B883 External hirudiniasis: Secondary | ICD-10-CM | POA: Diagnosis not present

## 2014-08-21 DIAGNOSIS — IMO0001 Reserved for inherently not codable concepts without codable children: Secondary | ICD-10-CM

## 2014-08-21 MED ORDER — TRIAMCINOLONE ACETONIDE 0.1 % EX CREA: 1.0000 "application " | TOPICAL_CREAM | Freq: Two times a day (BID) | CUTANEOUS | Status: DC

## 2014-08-21 NOTE — ED Provider Notes (Signed)
Rebecca Moyer is a 29 y.o. female who presents to Urgent Care today for leech bite. Patient was bitten by Leeches after [place].. She notes itching. No fevers or chills nausea vomiting or diarrhea. She feels well otherwise. No current chest pains or palpitations. She feels somewhat fatigued. No treatment tried yet. The patient has a history of panic disorder.   Past Medical History  Diagnosis Date  . Asthma 06/07/11  . H/O varicella   . H/O amenorrhea 01/27/04  . Irregular periods/menstrual cycles 05/22/04  . Depression 06/07/11  . Hypertension 06/07/11  . PCOS (polycystic ovarian syndrome) 06/07/11  . Obesity 06/07/11  . Smoker 06/07/11  . Diabetes mellitus without complication    Past Surgical History  Procedure Laterality Date  . Tonsillectomy  06/07/11  . Wisdom tooth extraction  06/07/11  . Adnoids  06/07/11   History  Substance Use Topics  . Smoking status: Current Some Day Smoker -- 0.10 packs/day    Types: Cigarettes  . Smokeless tobacco: Not on file     Comment: pt states she is try to quite as of now  . Alcohol Use: No   ROS as above Medications: No current facility-administered medications for this encounter.   Current Outpatient Prescriptions  Medication Sig Dispense Refill  . albuterol (PROVENTIL HFA;VENTOLIN HFA) 108 (90 BASE) MCG/ACT inhaler Inhale 2 puffs into the lungs every 6 (six) hours as needed for wheezing or shortness of breath. 1 Inhaler 2  . albuterol (PROVENTIL) (2.5 MG/3ML) 0.083% nebulizer solution Take 2.5 mg by nebulization every 8 (eight) hours as needed. Shortness of breath    . ALPRAZolam (XANAX) 1 MG tablet Take 1 mg by mouth 3 (three) times daily. Scheduled doses for anxiety    . Alum & Mag Hydroxide-Simeth (MAGIC MOUTHWASH W/LIDOCAINE) SOLN Take 5 mLs by mouth 4 (four) times daily as needed for mouth pain. (Patient not taking: Reported on 03/17/2014) 60 mL 0  . atenolol (TENORMIN) 50 MG tablet Take 50 mg by mouth every evening.   5   . diltiazem (CARDIZEM CD) 120 MG 24 hr capsule Take 120 mg by mouth daily.  12  . fenofibrate (TRICOR) 145 MG tablet Take 145 mg by mouth daily.    . haloperidol (HALDOL) 0.5 MG tablet Take 0.5 mg by mouth 2 (two) times daily.     . lansoprazole (PREVACID) 30 MG capsule Take 30 mg by mouth daily at 12 noon.    . Liraglutide (VICTOZA) 18 MG/3ML SOPN Inject 1.8 mg into the skin daily.     . metFORMIN (GLUMETZA) 1000 MG (MOD) 24 hr tablet Take 1,000 mg by mouth daily with breakfast.    . mirtazapine (REMERON) 30 MG tablet Take 30 mg by mouth at bedtime.    . triamcinolone cream (KENALOG) 0.1 % Apply 1 application topically 2 (two) times daily. 453.6 g 0  . [DISCONTINUED] dicyclomine (BENTYL) 20 MG tablet Take 1 tablet (20 mg total) by mouth 2 (two) times daily. (Patient not taking: Reported on 06/01/2014) 20 tablet 0  . [DISCONTINUED] diphenhydrAMINE (BENADRYL) 25 mg capsule Take 1 capsule (25 mg total) by mouth every 6 (six) hours as needed for itching. (Patient not taking: Reported on 03/17/2014) 30 capsule 0  . [DISCONTINUED] metoCLOPramide (REGLAN) 10 MG tablet Take 1 tablet (10 mg total) by mouth every 6 (six) hours. (Patient not taking: Reported on 06/01/2014) 30 tablet 0   Allergies  Allergen Reactions  . Penicillins Itching and Swelling  Facial swelling  . Naproxen Itching  . Triamterene     Blisters on stomach  . Ultram [Tramadol] Hives  . Fentanyl Palpitations     Exam:  BP 133/75 mmHg  Pulse 87  Temp(Src) 97.8 F (36.6 C) (Oral)  Resp 16  SpO2 99%  LMP 08/11/2014 (Within Days) Gen: Well NAD HEENT: EOMI,  MMM Lungs: Normal work of breathing. CTABL Heart: RRR no MRG Abd: NABS, Soft. Nondistended, Nontender Exts: Brisk capillary refill, warm and well perfused.  Skin: Tiny erythematous marks on legs bilaterally  No results found for this or any previous visit (from the past 24 hour(s)). No results found.  Assessment and Plan: 29 y.o. female with leech bites. Treat with  triamcinolone cream. Watchful waiting return as needed.  Discussed warning signs or symptoms. Please see discharge instructions. Patient expresses understanding.     Gregor Hams, MD 08/21/14 [phone]

## 2014-08-21 NOTE — ED Notes (Signed)
C/o bite from leeches today [place] her legs and back went numb States legs feels like they are on fire States she is weak

## 2014-08-21 NOTE — Discharge Instructions (Signed)
Thank you for coming in today. Return as needed.  Apply triamcinolone cream as needed. Follow-up with primary care doctor.  Go to the emergency room if you get worse.

## 2014-08-25 ENCOUNTER — Ambulatory Visit (HOSPITAL_COMMUNITY)
Admission: RE | Admit: 2014-08-25 | Discharge: 2014-08-25 | Disposition: A | Discharge status: Home / Self Care | Payer: Medicare Other | Source: Ambulatory Visit | Attending: Family Medicine | Admitting: Family Medicine

## 2014-08-25 DIAGNOSIS — K224 Dyskinesia of esophagus: Secondary | ICD-10-CM | POA: Insufficient documentation

## 2014-08-25 DIAGNOSIS — K209 Esophagitis, unspecified without bleeding: Secondary | ICD-10-CM

## 2014-08-25 DIAGNOSIS — R1310 Dysphagia, unspecified: Secondary | ICD-10-CM | POA: Diagnosis not present

## 2014-08-25 DIAGNOSIS — K219 Gastro-esophageal reflux disease without esophagitis: Secondary | ICD-10-CM | POA: Diagnosis not present

## 2014-08-30 ENCOUNTER — Encounter (HOSPITAL_COMMUNITY): Payer: Self-pay

## 2014-08-30 ENCOUNTER — Emergency Department (HOSPITAL_COMMUNITY)
Admission: EM | Admit: 2014-08-30 | Discharge: 2014-08-31 | Disposition: A | Discharge status: Home / Self Care | Payer: Medicare Other | Attending: Emergency Medicine | Admitting: Emergency Medicine

## 2014-08-30 DIAGNOSIS — Z8619 Personal history of other infectious and parasitic diseases: Secondary | ICD-10-CM | POA: Insufficient documentation

## 2014-08-30 DIAGNOSIS — Z79899 Other long term (current) drug therapy: Secondary | ICD-10-CM | POA: Diagnosis not present

## 2014-08-30 DIAGNOSIS — Z720 Tobacco use: Secondary | ICD-10-CM | POA: Insufficient documentation

## 2014-08-30 DIAGNOSIS — Z8742 Personal history of other diseases of the female genital tract: Secondary | ICD-10-CM | POA: Insufficient documentation

## 2014-08-30 DIAGNOSIS — L02412 Cutaneous abscess of left axilla: Secondary | ICD-10-CM | POA: Diagnosis present

## 2014-08-30 DIAGNOSIS — J45909 Unspecified asthma, uncomplicated: Secondary | ICD-10-CM | POA: Insufficient documentation

## 2014-08-30 DIAGNOSIS — E119 Type 2 diabetes mellitus without complications: Secondary | ICD-10-CM | POA: Diagnosis not present

## 2014-08-30 DIAGNOSIS — Z880 Allergy status to penicillin: Secondary | ICD-10-CM | POA: Diagnosis not present

## 2014-08-30 DIAGNOSIS — L02419 Cutaneous abscess of limb, unspecified: Secondary | ICD-10-CM

## 2014-08-30 DIAGNOSIS — F329 Major depressive disorder, single episode, unspecified: Secondary | ICD-10-CM | POA: Diagnosis not present

## 2014-08-30 DIAGNOSIS — E669 Obesity, unspecified: Secondary | ICD-10-CM | POA: Diagnosis not present

## 2014-08-30 DIAGNOSIS — I10 Essential (primary) hypertension: Secondary | ICD-10-CM | POA: Insufficient documentation

## 2014-08-30 NOTE — ED Notes (Signed)
Pt has an abcess under her left arm

## 2014-08-30 NOTE — ED Notes (Signed)
Patient has a boil that is under her left arm. She stated that she has had it for three days and it is getting more painful.

## 2014-08-31 DIAGNOSIS — L02412 Cutaneous abscess of left axilla: Secondary | ICD-10-CM | POA: Diagnosis not present

## 2014-08-31 MED ORDER — OXYCODONE-ACETAMINOPHEN 5-325 MG PO TABS: 1.0000 | ORAL_TABLET | ORAL | Status: DC | PRN

## 2014-08-31 MED ORDER — DOXYCYCLINE HYCLATE 100 MG PO CAPS: 100.0000 mg | ORAL_CAPSULE | Freq: Two times a day (BID) | ORAL | Status: DC

## 2014-08-31 MED ORDER — DOXYCYCLINE HYCLATE 100 MG PO TABS
100.0000 mg | ORAL_TABLET | Freq: Once | ORAL | Status: AC
  Administered 2014-08-31: 100 mg via ORAL
  Filled 2014-08-31: qty 1

## 2014-08-31 MED ORDER — OXYCODONE-ACETAMINOPHEN 5-325 MG PO TABS
1.0000 | ORAL_TABLET | Freq: Once | ORAL | Status: AC
  Administered 2014-08-31: 1 via ORAL
  Filled 2014-08-31: qty 1

## 2014-08-31 NOTE — ED Provider Notes (Signed)
CSN: [id number]     Arrival date & time 08/30/14  2241 History   First MD Initiated Contact with Patient 08/31/14 0046     Chief Complaint  Patient presents with  . Abscess     (Consider location/radiation/quality/duration/timing/severity/associated sxs/prior Treatment) Patient is a 29 y.o. female presenting with abscess. The history is provided by the patient. No language interpreter was used.  Abscess Location:  Torso Torso abscess location:  L axilla Abscess quality: painful   Abscess quality: not draining   Associated symptoms: no fever and no nausea   Associated symptoms comment:  Recurrent symptoms of painful swelling in axilla, this occurrence in the left. Painful for greater than one week, worse in the last 3 days. No fever. No drainage.    Past Medical History  Diagnosis Date  . Asthma 06/07/11  . H/O varicella   . H/O amenorrhea 01/27/04  . Irregular periods/menstrual cycles 05/22/04  . Depression 06/07/11  . Hypertension 06/07/11  . PCOS (polycystic ovarian syndrome) 06/07/11  . Obesity 06/07/11  . Smoker 06/07/11  . Diabetes mellitus without complication    Past Surgical History  Procedure Laterality Date  . Tonsillectomy  06/07/11  . Wisdom tooth extraction  06/07/11  . Adnoids  06/07/11   History reviewed. No pertinent family history. History  Substance Use Topics  . Smoking status: Current Some Day Smoker -- 0.10 packs/day    Types: Cigarettes  . Smokeless tobacco: Not on file     Comment: pt states she is try to quite as of now  . Alcohol Use: No   OB History    No data available     Review of Systems  Constitutional: Negative for fever and chills.  Gastrointestinal: Negative for nausea.  Musculoskeletal: Negative.   Skin:       C/O Abscess.  Neurological: Negative.       Allergies  Penicillins; Naproxen; Triamterene; Ultram; and Fentanyl  Home Medications   Prior to Admission medications   Medication Sig Start Date End Date Taking?  Authorizing Provider  albuterol (PROVENTIL HFA;VENTOLIN HFA) 108 (90 BASE) MCG/ACT inhaler Inhale 2 puffs into the lungs every 6 (six) hours as needed for wheezing or shortness of breath. 01/01/14  Yes Varney Biles, MD  ALPRAZolam Duanne Moron) 1 MG tablet Take 1 mg by mouth 3 (three) times daily.    Yes Historical Provider, MD  atenolol (TENORMIN) 50 MG tablet Take 50 mg by mouth every evening.  01/20/14  Yes Historical Provider, MD  diltiazem (CARDIZEM CD) 120 MG 24 hr capsule Take 120 mg by mouth daily. 01/25/14  Yes Historical Provider, MD  fenofibrate (TRICOR) 145 MG tablet Take 145 mg by mouth daily.   Yes Historical Provider, MD  haloperidol (HALDOL) 0.5 MG tablet Take 0.5 mg by mouth 2 (two) times daily.    Yes Historical Provider, MD  lansoprazole (PREVACID) 30 MG capsule Take 30 mg by mouth daily at 12 noon.   Yes Historical Provider, MD  Liraglutide (VICTOZA) 18 MG/3ML SOPN Inject 1.8 mg into the skin daily.    Yes Historical Provider, MD  metFORMIN (GLUMETZA) 1000 MG (MOD) 24 hr tablet Take 1,000 mg by mouth daily with breakfast.   Yes Historical Provider, MD  mirtazapine (REMERON) 30 MG tablet Take 30 mg by mouth at bedtime.   Yes Historical Provider, MD  albuterol (PROVENTIL) (2.5 MG/3ML) 0.083% nebulizer solution Take 2.5 mg by nebulization every 8 (eight) hours as needed. Shortness of breath    Historical Provider, MD  Alum & Mag Hydroxide-Simeth (MAGIC MOUTHWASH W/LIDOCAINE) SOLN Take 5 mLs by mouth 4 (four) times daily as needed for mouth pain. Patient not taking: Reported on 03/17/2014 03/11/14   Margarita Mail, PA-C  triamcinolone cream (KENALOG) 0.1 % Apply 1 application topically 2 (two) times daily. Patient not taking: Reported on 08/30/2014 08/21/14   Gregor Hams, MD   BP 120/61 mmHg  Pulse 75  Temp(Src) 97.8 F (36.6 C) (Oral)  Resp 18  Ht 5\' 9"  (1.753 m)  SpO2 98%  LMP 08/11/2014 (Within Days) Physical Exam  Constitutional: She is oriented to person, place, and time. She  appears well-developed and well-nourished.  Neck: Normal range of motion.  Pulmonary/Chest: Effort normal.  Neurological: She is alert and oriented to person, place, and time.  Skin: Skin is warm and dry.  Small area of induration without fluctuance to generally tender left axilla.     ED Course  Procedures (including critical care time) Labs Review Labs Reviewed - No data to display  Imaging Review No results found.   EKG Interpretation None      MDM   Final diagnoses:  None    1. Recurrent axillary abscess 2. Hidradenitis Suppurativa  Recommend warm compresses, abx, and surgical follow up. Patient agrees with plan.    Charlann Lange, PA-C 08/31/14 0125  Shanon Rosser, MD 08/31/14 [phone]

## 2014-08-31 NOTE — Discharge Instructions (Signed)
Abscess An abscess is an infected area that contains a collection of pus and debris.It can occur in almost any part of the body. An abscess is also known as a furuncle or boil. CAUSES  An abscess occurs when tissue gets infected. This can occur from blockage of oil or sweat glands, infection of hair follicles, or a minor injury to the skin. As the body tries to fight the infection, pus collects in the area and creates pressure under the skin. This pressure causes pain. People with weakened immune systems have difficulty fighting infections and get certain abscesses more often.  SYMPTOMS Usually an abscess develops on the skin and becomes a painful mass that is red, warm, and tender. If the abscess forms under the skin, you may feel a moveable soft area under the skin. Some abscesses break open (rupture) on their own, but most will continue to get worse without care. The infection can spread deeper into the body and eventually into the bloodstream, causing you to feel ill.  DIAGNOSIS  Your caregiver will take your medical history and perform a physical exam. A sample of fluid may also be taken from the abscess to determine what is causing your infection. TREATMENT  Your caregiver may prescribe antibiotic medicines to fight the infection. However, taking antibiotics alone usually does not cure an abscess. Your caregiver may need to make a small cut (incision) in the abscess to drain the pus. In some cases, gauze is packed into the abscess to reduce pain and to continue draining the area. HOME CARE INSTRUCTIONS   Only take over-the-counter or prescription medicines for pain, discomfort, or fever as directed by your caregiver.  If you were prescribed antibiotics, take them as directed. Finish them even if you start to feel better.  If gauze is used, follow your caregiver's directions for changing the gauze.  To avoid spreading the infection:  Keep your draining abscess covered with a  bandage.  Wash your hands well.  Do not share personal care items, towels, or whirlpools with others.  Avoid skin contact with others.  Keep your skin and clothes clean around the abscess.  Keep all follow-up appointments as directed by your caregiver. SEEK MEDICAL CARE IF:   You have increased pain, swelling, redness, fluid drainage, or bleeding.  You have muscle aches, chills, or a general ill feeling.  You have a fever. MAKE SURE YOU:   Understand these instructions.  Will watch your condition.  Will get help right away if you are not doing well or get worse. Document Released: 12/06/2004 Document Revised: 08/28/2011 Document Reviewed: 05/11/2011 [facility] Patient Information 2015 [place], [place]. This information is not intended to replace advice given to you by your health care provider. Make sure you discuss any questions you have with your health care provider. Heat Therapy Heat therapy can help ease sore, stiff, injured, and tight muscles and joints. Heat relaxes your muscles, which may help ease your pain.  RISKS AND COMPLICATIONS If you have any of the following conditions, do not use heat therapy unless your health care provider has approved:  Poor circulation.  Healing wounds or scarred skin in the area being treated.  Diabetes, heart disease, or high blood pressure.  Not being able to feel (numbness) the area being treated.  Unusual swelling of the area being treated.  Active infections.  Blood clots.  Cancer.  Inability to communicate pain. This may include young children and people who have problems with their brain function (dementia).  Pregnancy.  Heat therapy should only be used on old, pre-existing, or long-lasting (chronic) injuries. Do not use heat therapy on new injuries unless directed by your health care provider. HOW TO USE HEAT THERAPY There are several different kinds of heat therapy, including:  Moist heat pack.  Warm water  bath.  Hot water bottle.  Electric heating pad.  Heated gel pack.  Heated wrap.  Electric heating pad. Use the heat therapy method suggested by your health care provider. Follow your health care provider's instructions on when and how to use heat therapy. GENERAL HEAT THERAPY RECOMMENDATIONS  Do not sleep while using heat therapy. Only use heat therapy while you are awake.  Your skin may turn pink while using heat therapy. Do not use heat therapy if your skin turns red.  Do not use heat therapy if you have new pain.  High heat or long exposure to heat can cause burns. Be careful when using heat therapy to avoid burning your skin.  Do not use heat therapy on areas of your skin that are already irritated, such as with a rash or sunburn. SEEK MEDICAL CARE IF:  You have blisters, redness, swelling, or numbness.  You have new pain.  Your pain is worse. MAKE SURE YOU:  Understand these instructions.  Will watch your condition.  Will get help right away if you are not doing well or get worse. Document Released: 05/21/2011 Document Revised: 07/13/2013 Document Reviewed: 04/21/2013 [facility] Patient Information 2015 [place], [place]. This information is not intended to replace advice given to you by your health care provider. Make sure you discuss any questions you have with your health care provider.

## 2014-09-08 ENCOUNTER — Encounter (HOSPITAL_COMMUNITY): Payer: Self-pay | Admitting: Emergency Medicine

## 2014-09-08 ENCOUNTER — Emergency Department (HOSPITAL_COMMUNITY)
Admission: EM | Admit: 2014-09-08 | Discharge: 2014-09-08 | Disposition: A | Discharge status: Home / Self Care | Payer: Medicare Other | Attending: Emergency Medicine | Admitting: Emergency Medicine

## 2014-09-08 DIAGNOSIS — L732 Hidradenitis suppurativa: Secondary | ICD-10-CM | POA: Insufficient documentation

## 2014-09-08 DIAGNOSIS — I10 Essential (primary) hypertension: Secondary | ICD-10-CM | POA: Insufficient documentation

## 2014-09-08 DIAGNOSIS — Z720 Tobacco use: Secondary | ICD-10-CM | POA: Diagnosis not present

## 2014-09-08 DIAGNOSIS — L02412 Cutaneous abscess of left axilla: Secondary | ICD-10-CM | POA: Diagnosis present

## 2014-09-08 DIAGNOSIS — Z880 Allergy status to penicillin: Secondary | ICD-10-CM | POA: Diagnosis not present

## 2014-09-08 DIAGNOSIS — F329 Major depressive disorder, single episode, unspecified: Secondary | ICD-10-CM | POA: Diagnosis not present

## 2014-09-08 DIAGNOSIS — Z79899 Other long term (current) drug therapy: Secondary | ICD-10-CM | POA: Insufficient documentation

## 2014-09-08 DIAGNOSIS — Z8619 Personal history of other infectious and parasitic diseases: Secondary | ICD-10-CM | POA: Diagnosis not present

## 2014-09-08 DIAGNOSIS — Z8742 Personal history of other diseases of the female genital tract: Secondary | ICD-10-CM | POA: Diagnosis not present

## 2014-09-08 DIAGNOSIS — E119 Type 2 diabetes mellitus without complications: Secondary | ICD-10-CM | POA: Insufficient documentation

## 2014-09-08 DIAGNOSIS — E669 Obesity, unspecified: Secondary | ICD-10-CM | POA: Insufficient documentation

## 2014-09-08 DIAGNOSIS — J45909 Unspecified asthma, uncomplicated: Secondary | ICD-10-CM | POA: Insufficient documentation

## 2014-09-08 MED ORDER — LIDOCAINE HCL (PF) 1 % IJ SOLN
5.0000 mL | Freq: Once | INTRAMUSCULAR | Status: AC
  Administered 2014-09-08: 5 mL
  Filled 2014-09-08: qty 5

## 2014-09-08 MED ORDER — HYDROCODONE-ACETAMINOPHEN 5-325 MG PO TABS: 1.0000 | ORAL_TABLET | Freq: Four times a day (QID) | ORAL | Status: DC | PRN

## 2014-09-08 MED ORDER — OXYCODONE-ACETAMINOPHEN 5-325 MG PO TABS
2.0000 | ORAL_TABLET | Freq: Once | ORAL | Status: AC
  Administered 2014-09-08: 2 via ORAL
  Filled 2014-09-08: qty 2

## 2014-09-08 NOTE — ED Notes (Signed)
Pt c/o multiple abscesses under each armpit and on her left gluteal area.  Pain is currently 10/10 and is unrelieved by Tylenol at home.  Pt was treated for abscess under left armpit last week and has taken antibiotics as prescribed but symptoms have not improved and in fact have worsened over the past several days.

## 2014-09-08 NOTE — Discharge Instructions (Signed)
Hidradenitis Suppurativa, Sweat Gland Abscess Hidradenitis suppurativa is a long lasting (chronic), uncommon disease of the sweat glands. With this, boil-like lumps and scarring develop in the groin, some times under the arms (axillae), and under the breasts. It may also uncommonly occur behind the ears, in the crease of the buttocks, and around the genitals.  CAUSES  The cause is from a blocking of the sweat glands. They then become infected. It may cause drainage and odor. It is not contagious. So it cannot be given to someone else. It most often shows up in puberty (about 31 to 27 years of age). But it may happen much later. It is similar to acne which is a disease of the sweat glands. This condition is slightly more common in African-Americans and women. SYMPTOMS   Hidradenitis usually starts as one or more red, tender, swellings in the groin or under the arms (axilla).  Over a period of hours to days the lesions get larger. They often open to the skin surface, draining clear to yellow-colored fluid.  The infected area heals with scarring. DIAGNOSIS  Your caregiver makes this diagnosis by looking at you. Sometimes cultures (growing germs on plates in the lab) may be taken. This is to see what germ (bacterium) is causing the infection.  TREATMENT   Topical germ killing medicine applied to the skin (antibiotics) are the treatment of choice. Antibiotics taken by mouth (systemic) are sometimes needed when the condition is getting worse or is severe.  Avoid tight-fitting clothing which traps moisture in.  Dirt does not cause hidradenitis and it is not caused by poor hygiene.  Involved areas should be cleaned daily using an antibacterial soap. Some patients find that the liquid form of Lever 2000, applied to the involved areas as a lotion after bathing, can help reduce the odor related to this condition.  Sometimes surgery is needed to drain infected areas or remove scarred tissue. Removal of  large amounts of tissue is used only in severe cases.  Birth control pills may be helpful.  Oral retinoids (vitamin A derivatives) for 6 to 12 months which are effective for acne may also help this condition.  Weight loss will improve but not cure hidradenitis. It is made worse by being overweight. But the condition is not caused by being overweight.  This condition is more common in people who have had acne.  It may become worse under stress. There is no medical cure for hidradenitis. It can be controlled, but not cured. The condition usually continues for years with periods of getting worse and getting better (remission). Document Released: 10/11/2003 Document Revised: 05/21/2011 Document Reviewed: 05/29/2013 [facility] Patient Information 2015 [place], [place]. This information is not intended to replace advice given to you by your health care provider. Make sure you discuss any questions you have with your health care provider. Tonight, prior request.  You have 2 small abscesses, one in each axilla that was I&D for small amount of drainage.  Please follow-up with your primary care physician to follow through further referrals to general surgery

## 2014-09-08 NOTE — ED Provider Notes (Signed)
CSN: [id number]     Arrival date & time 09/08/14  0041 History   First MD Initiated Contact with Patient 09/08/14 0230     Chief Complaint  Patient presents with  . 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  This has not been successful.  She presents now with worsening abscesses in her axilla, bilateral, that she's requesting I&D.  She has been referred to general surgery, but they state because of insurance she needs her primary care physician to refer her.  This has been requested  Patient is a 29 y.o. female presenting with abscess. The history is provided by the patient.  Abscess Location:  Shoulder/arm Shoulder/arm abscess location:  L axilla and R axilla Size:  Small Abscess quality: fluctuance, painful and redness   Red streaking: no   Progression:  Worsening Pain details:    Quality:  Aching   Severity:  Moderate   Timing:  Constant   Progression:  Worsening Chronicity:  Chronic Relieved by:  Nothing Worsened by:  Draining/squeezing Ineffective treatments:  Warm water soaks, warm compresses and oral antibiotics Associated symptoms: no fever     Past Medical History  Diagnosis Date  . Asthma 06/07/11  . H/O varicella   . H/O amenorrhea 01/27/04  . Irregular periods/menstrual cycles 05/22/04  . Depression 06/07/11  . Hypertension 06/07/11  . PCOS (polycystic ovarian syndrome) 06/07/11  . Obesity 06/07/11  . Smoker 06/07/11  . Diabetes mellitus without complication    Past Surgical History  Procedure Laterality Date  . Tonsillectomy  06/07/11  . Wisdom tooth extraction  06/07/11  . Adnoids  06/07/11   History reviewed. No pertinent family history. History  Substance Use Topics  . Smoking status: Current Some Day Smoker --  0.10 packs/day    Types: Cigarettes  . Smokeless tobacco: Not on file     Comment: pt states she is try to quite as of now  . Alcohol Use: No   OB History    No data available     Review of Systems  Constitutional: Negative for fever.  Skin: Positive for wound.  Neurological: Negative for dizziness and numbness.  All other systems reviewed and are negative.     Allergies  Fentanyl; Penicillins; Naproxen; Triamterene; and Ultram  Home Medications   Prior to Admission medications   Medication Sig Start Date End Date Taking? Authorizing Provider  albuterol (PROVENTIL HFA;VENTOLIN HFA) 108 (90 BASE) MCG/ACT inhaler Inhale 2 puffs into the lungs every 6 (six) hours as needed for wheezing or shortness of breath. 01/01/14  Yes Ankit Kathrynn Humble, MD  albuterol (PROVENTIL) (2.5 MG/3ML) 0.083% nebulizer solution Take 2.5 mg by nebulization every 8 (eight) hours as needed. Shortness of breath   Yes Historical Provider, MD  ALPRAZolam (XANAX) 1 MG tablet Take 1 mg by mouth 3 (three) times daily.    Yes Historical Provider, MD  atenolol (TENORMIN) 50 MG tablet Take 50 mg by mouth every evening.  01/20/14  Yes Historical Provider, MD  diltiazem (CARDIZEM CD) 120 MG 24 hr capsule Take 120 mg by mouth daily. 01/25/14  Yes Historical Provider, MD  fenofibrate (TRICOR) 145 MG tablet Take 145 mg by mouth daily.   Yes Historical Provider, MD  haloperidol (HALDOL) 0.5 MG tablet Take 0.5 mg by mouth 2 (two) times daily.  Yes Historical Provider, MD  lansoprazole (PREVACID) 30 MG capsule Take 30 mg by mouth daily at 12 noon.   Yes Historical Provider, MD  Liraglutide (VICTOZA) 18 MG/3ML SOPN Inject 1.8 mg into the skin daily.    Yes Historical Provider, MD  metFORMIN (GLUMETZA) 1000 MG (MOD) 24 hr tablet Take 1,000 mg by mouth daily with breakfast.   Yes Historical Provider, MD  mirtazapine (REMERON) 30 MG tablet Take 30 mg by mouth at bedtime.   Yes Historical Provider, MD  Alum & Mag Hydroxide-Simeth  (MAGIC MOUTHWASH W/LIDOCAINE) SOLN Take 5 mLs by mouth 4 (four) times daily as needed for mouth pain. Patient not taking: Reported on 03/17/2014 03/11/14   Margarita Mail, PA-C  doxycycline (VIBRAMYCIN) 100 MG capsule Take 1 capsule (100 mg total) by mouth 2 (two) times daily. Patient not taking: Reported on 09/08/2014 08/31/14   Charlann Lange, PA-C  HYDROcodone-acetaminophen (NORCO/VICODIN) 5-325 MG per tablet Take 1 tablet by mouth every 6 (six) hours as needed for moderate pain. 09/08/14   Junius Creamer, NP  oxyCODONE-acetaminophen (PERCOCET/ROXICET) 5-325 MG per tablet Take 1-2 tablets by mouth every 4 (four) hours as needed for severe pain. Patient not taking: Reported on 09/08/2014 08/31/14   Charlann Lange, PA-C  triamcinolone cream (KENALOG) 0.1 % Apply 1 application topically 2 (two) times daily. Patient not taking: Reported on 08/30/2014 08/21/14   Gregor Hams, MD   BP 142/75 mmHg  Pulse 90  Temp(Src) 98.1 F (36.7 C) (Oral)  Resp 20  SpO2 97%  LMP 08/11/2014 (Within Days) Physical Exam  Constitutional: She is oriented to person, place, and time. She appears well-developed and well-nourished.  HENT:  Head: Normocephalic.  Eyes: Pupils are equal, round, and reactive to light.  Neck: Normal range of motion.  Cardiovascular: Normal rate.   Pulmonary/Chest: Effort normal.  Abdominal: Soft.  Musculoskeletal: Normal range of motion.  Neurological: She is alert and oriented to person, place, and time.  Skin: Skin is warm.  Nursing note and vitals reviewed.   ED Course  INCISION AND DRAINAGE Date/Time: 09/08/2014 4:34 AM Performed by: Junius Creamer Authorized by: Junius Creamer Consent: Verbal consent obtained. Written consent not obtained. Risks and benefits: risks, benefits and alternatives were discussed Consent given by: patient Patient understanding: patient states understanding of the procedure being performed Patient identity confirmed: verbally with patient Time out: Immediately  prior to procedure a "time out" was called to verify the correct patient, procedure, equipment, support staff and site/side marked as required. Type: abscess Body area: upper extremity Location details: right arm Anesthesia: local infiltration Local anesthetic: lidocaine 1% without epinephrine Anesthetic total: 2 ml Patient sedated: no Scalpel size: 11 Needle gauge: 25. Incision type: single straight Complexity: simple Drainage: bloody and  purulent Drainage amount: scant Wound treatment: wound left open Packing material: none Patient tolerance: Patient tolerated the procedure well with no immediate complications   (including critical care time) Labs Review Labs Reviewed - No data to display  Imaging Review No results found.   EKG Interpretation None     INCISION AND DRAINAGE Performed by: Garald Balding Consent: Verbal consent obtained. Risks and benefits: risks, benefits and alternatives were discussed Type: abscess    Body area: Left arm  Anesthesia: local infiltration  Incision was made with a scalpel.  Local anesthetic: lidocaine 1% without epinephrine  Anesthetic total: 2.0 ml  Complexity:simple Blunt dissection to break up loculations  Drainage: bloody  Drainage amount: scant  Packing material: none  Patient tolerance: Patient tolerated the  procedure well with no immediate complications.     MDM   Final diagnoses:  Hydradenitis         Junius Creamer, NP 09/08/14 1683  Julianne Rice, MD 09/08/14 [phone]

## 2014-09-09 ENCOUNTER — Ambulatory Visit: Payer: Self-pay | Admitting: Surgery

## 2014-09-10 DIAGNOSIS — L732 Hidradenitis suppurativa: Secondary | ICD-10-CM

## 2014-09-10 HISTORY — DX: Hidradenitis suppurativa: L73.2

## 2014-09-20 DIAGNOSIS — L732 Hidradenitis suppurativa: Secondary | ICD-10-CM | POA: Insufficient documentation

## 2014-09-20 DIAGNOSIS — Z720 Tobacco use: Secondary | ICD-10-CM | POA: Diagnosis not present

## 2014-09-20 DIAGNOSIS — Z8619 Personal history of other infectious and parasitic diseases: Secondary | ICD-10-CM | POA: Insufficient documentation

## 2014-09-20 DIAGNOSIS — Z79899 Other long term (current) drug therapy: Secondary | ICD-10-CM | POA: Insufficient documentation

## 2014-09-20 DIAGNOSIS — E119 Type 2 diabetes mellitus without complications: Secondary | ICD-10-CM | POA: Insufficient documentation

## 2014-09-20 DIAGNOSIS — I10 Essential (primary) hypertension: Secondary | ICD-10-CM | POA: Diagnosis not present

## 2014-09-20 DIAGNOSIS — Z880 Allergy status to penicillin: Secondary | ICD-10-CM | POA: Diagnosis not present

## 2014-09-20 DIAGNOSIS — F329 Major depressive disorder, single episode, unspecified: Secondary | ICD-10-CM | POA: Insufficient documentation

## 2014-09-20 DIAGNOSIS — L02412 Cutaneous abscess of left axilla: Secondary | ICD-10-CM | POA: Diagnosis present

## 2014-09-20 DIAGNOSIS — J45909 Unspecified asthma, uncomplicated: Secondary | ICD-10-CM | POA: Diagnosis not present

## 2014-09-20 DIAGNOSIS — E669 Obesity, unspecified: Secondary | ICD-10-CM | POA: Insufficient documentation

## 2014-09-20 DIAGNOSIS — Z8742 Personal history of other diseases of the female genital tract: Secondary | ICD-10-CM | POA: Diagnosis not present

## 2014-09-21 ENCOUNTER — Emergency Department (HOSPITAL_COMMUNITY)
Admission: EM | Admit: 2014-09-21 | Discharge: 2014-09-21 | Disposition: A | Discharge status: Home / Self Care | Payer: Medicare Other | Attending: Emergency Medicine | Admitting: Emergency Medicine

## 2014-09-21 ENCOUNTER — Encounter (HOSPITAL_COMMUNITY): Payer: Self-pay | Admitting: Emergency Medicine

## 2014-09-21 DIAGNOSIS — L732 Hidradenitis suppurativa: Secondary | ICD-10-CM | POA: Diagnosis not present

## 2014-09-21 MED ORDER — OXYCODONE-ACETAMINOPHEN 5-325 MG PO TABS
2.0000 | ORAL_TABLET | Freq: Once | ORAL | Status: AC
  Administered 2014-09-21: 2 via ORAL
  Filled 2014-09-21: qty 2

## 2014-09-21 MED ORDER — OXYCODONE-ACETAMINOPHEN 5-325 MG PO TABS: 1.0000 | ORAL_TABLET | ORAL | Status: DC | PRN

## 2014-09-21 MED ORDER — LIDOCAINE-EPINEPHRINE (PF) 2 %-1:200000 IJ SOLN
20.0000 mL | Freq: Once | INTRAMUSCULAR | Status: AC
  Administered 2014-09-21: 20 mL
  Filled 2014-09-21: qty 20

## 2014-09-21 NOTE — ED Notes (Signed)
Pt states she has abscesses under her arms

## 2014-09-21 NOTE — Discharge Instructions (Signed)
Hidradenitis Suppurativa, Sweat Gland Abscess Hidradenitis suppurativa is a long lasting (chronic), uncommon disease of the sweat glands. With this, boil-like lumps and scarring develop in the groin, some times under the arms (axillae), and under the breasts. It may also uncommonly occur behind the ears, in the crease of the buttocks, and around the genitals.  CAUSES  The cause is from a blocking of the sweat glands. They then become infected. It may cause drainage and odor. It is not contagious. So it cannot be given to someone else. It most often shows up in puberty (about 10 to 27 years of age). But it may happen much later. It is similar to acne which is a disease of the sweat glands. This condition is slightly more common in African-Americans and women. SYMPTOMS   Hidradenitis usually starts as one or more red, tender, swellings in the groin or under the arms (axilla).  Over a period of hours to days the lesions get larger. They often open to the skin surface, draining clear to yellow-colored fluid.  The infected area heals with scarring. DIAGNOSIS  Your caregiver makes this diagnosis by looking at you. Sometimes cultures (growing germs on plates in the lab) may be taken. This is to see what germ (bacterium) is causing the infection.  TREATMENT   Topical germ killing medicine applied to the skin (antibiotics) are the treatment of choice. Antibiotics taken by mouth (systemic) are sometimes needed when the condition is getting worse or is severe.  Avoid tight-fitting clothing which traps moisture in.  Dirt does not cause hidradenitis and it is not caused by poor hygiene.  Involved areas should be cleaned daily using an antibacterial soap. Some patients find that the liquid form of Lever 2000, applied to the involved areas as a lotion after bathing, can help reduce the odor related to this condition.  Sometimes surgery is needed to drain infected areas or remove scarred tissue. Removal of  large amounts of tissue is used only in severe cases.  Birth control pills may be helpful.  Oral retinoids (vitamin A derivatives) for 6 to 12 months which are effective for acne may also help this condition.  Weight loss will improve but not cure hidradenitis. It is made worse by being overweight. But the condition is not caused by being overweight.  This condition is more common in people who have had acne.  It may become worse under stress. There is no medical cure for hidradenitis. It can be controlled, but not cured. The condition usually continues for years with periods of getting worse and getting better (remission). Document Released: 10/11/2003 Document Revised: 05/21/2011 Document Reviewed: 05/29/2013 [employer] Patient Information 2015 [employer]. This information is not intended to replace advice given to you by your health care provider. Make sure you discuss any questions you have with your health care provider.  

## 2014-09-21 NOTE — ED Provider Notes (Signed)
CSN: [id number]     Arrival date & time 09/20/14  2348 History   First MD Initiated Contact with Patient 09/21/14 0301     Chief Complaint  Patient presents with  . Abscess     (Consider location/radiation/quality/duration/timing/severity/associated sxs/prior Treatment) Patient is a 29 y.o. female presenting with abscess. The history is provided by the patient. No language interpreter was used.  Abscess Associated symptoms: no fever and no nausea   Associated symptoms comment:  Recurrent axillary abscesses bilaterally c/w history of hidradenitis suppurativa. She denies fever. No drainage. She is scheduled for surgery with Dr. Harlow Asa in 2 weeks but was told by him tonight to come to the emergency department if symptoms were not manageable at home.    Past Medical History  Diagnosis Date  . Asthma 06/07/11  . H/O varicella   . H/O amenorrhea 01/27/04  . Irregular periods/menstrual cycles 05/22/04  . Depression 06/07/11  . Hypertension 06/07/11  . PCOS (polycystic ovarian syndrome) 06/07/11  . Obesity 06/07/11  . Smoker 06/07/11  . Diabetes mellitus without complication    Past Surgical History  Procedure Laterality Date  . Tonsillectomy  06/07/11  . Wisdom tooth extraction  06/07/11  . Adnoids  06/07/11   History reviewed. No pertinent family history. History  Substance Use Topics  . Smoking status: Current Some Day Smoker -- 0.10 packs/day    Types: Cigarettes  . Smokeless tobacco: Not on file     Comment: pt states she is try to quite as of now  . Alcohol Use: No   OB History    No data available     Review of Systems  Constitutional: Negative for fever and chills.  Gastrointestinal: Negative for nausea.  Musculoskeletal: Negative.   Skin:       C/O Abscess.  Neurological: Negative.       Allergies  Fentanyl; Penicillins; Naproxen; Triamterene; and Ultram  Home Medications   Prior to Admission medications   Medication Sig Start Date End Date Taking? Authorizing  Provider  albuterol (PROVENTIL HFA;VENTOLIN HFA) 108 (90 BASE) MCG/ACT inhaler Inhale 2 puffs into the lungs every 6 (six) hours as needed for wheezing or shortness of breath. 01/01/14   Varney Biles, MD  albuterol (PROVENTIL) (2.5 MG/3ML) 0.083% nebulizer solution Take 2.5 mg by nebulization every 8 (eight) hours as needed. Shortness of breath    Historical Provider, MD  ALPRAZolam Duanne Moron) 1 MG tablet Take 1 mg by mouth 3 (three) times daily.     Historical Provider, MD  Alum & Mag Hydroxide-Simeth (MAGIC MOUTHWASH W/LIDOCAINE) SOLN Take 5 mLs by mouth 4 (four) times daily as needed for mouth pain. Patient not taking: Reported on 03/17/2014 03/11/14   Margarita Mail, PA-C  atenolol (TENORMIN) 50 MG tablet Take 50 mg by mouth every evening.  01/20/14   Historical Provider, MD  diltiazem (CARDIZEM CD) 120 MG 24 hr capsule Take 120 mg by mouth daily. 01/25/14   Historical Provider, MD  doxycycline (VIBRAMYCIN) 100 MG capsule Take 1 capsule (100 mg total) by mouth 2 (two) times daily. Patient not taking: Reported on 09/08/2014 08/31/14   Charlann Lange, PA-C  fenofibrate (TRICOR) 145 MG tablet Take 145 mg by mouth daily.    Historical Provider, MD  haloperidol (HALDOL) 0.5 MG tablet Take 0.5 mg by mouth 2 (two) times daily.     Historical Provider, MD  HYDROcodone-acetaminophen (NORCO/VICODIN) 5-325 MG per tablet Take 1 tablet by mouth every 6 (six) hours as needed for moderate pain. 09/08/14  Junius Creamer, NP  lansoprazole (PREVACID) 30 MG capsule Take 30 mg by mouth daily at 12 noon.    Historical Provider, MD  Liraglutide (VICTOZA) 18 MG/3ML SOPN Inject 1.8 mg into the skin daily.     Historical Provider, MD  metFORMIN (GLUMETZA) 1000 MG (MOD) 24 hr tablet Take 1,000 mg by mouth daily with breakfast.    Historical Provider, MD  mirtazapine (REMERON) 30 MG tablet Take 30 mg by mouth at bedtime.    Historical Provider, MD  oxyCODONE-acetaminophen (PERCOCET/ROXICET) 5-325 MG per tablet Take 1-2 tablets by  mouth every 4 (four) hours as needed for severe pain. Patient not taking: Reported on 09/08/2014 08/31/14   Charlann Lange, PA-C  triamcinolone cream (KENALOG) 0.1 % Apply 1 application topically 2 (two) times daily. Patient not taking: Reported on 08/30/2014 08/21/14   Gregor Hams, MD   BP 115/77 mmHg  Pulse 82  Temp(Src) 97.9 F (36.6 C) (Oral)  Resp 18  SpO2 98%  LMP 09/10/2014 (Exact Date) Physical Exam  Constitutional: She is oriented to person, place, and time. She appears well-developed and well-nourished.  Neck: Normal range of motion.  Pulmonary/Chest: Effort normal.  Neurological: She is alert and oriented to person, place, and time.  Skin: Skin is warm and dry.  Indurated and fluctuant areas to bilateral axilla. No surrounding cellulitic changes. No active drainage.     ED Course  Procedures (including critical care time) Labs Review Labs Reviewed - No data to display  Imaging Review No results found.   EKG Interpretation None     INCISION AND DRAINAGE Performed by: Charlann Lange A Consent: Verbal consent obtained. Risks and benefits: risks, benefits and alternatives were discussed Type: abscess  Body area: right axilla  Anesthesia: local infiltration  Incision was made with a scalpel.  Local anesthetic: lidocaine 2% w/ epinephrine  Anesthetic total: 2 ml  Complexity: complex Blunt dissection to break up loculations  Drainage: purulent  Drainage amount: small  Patient tolerance: Patient tolerated the procedure well with no immediate complications.  INCISION AND DRAINAGE Performed by: Charlann Lange A Consent: Verbal consent obtained. Risks and benefits: risks, benefits and alternatives were discussed Type: abscess  Body area: left axilla  Anesthesia: local infiltration  Incision was made with a scalpel.  Local anesthetic: lidocaine 2% w/ epinephrine  Anesthetic total: 2 ml  Complexity: complex Blunt dissection to break up  loculations  Drainage: purulent  Drainage amount: moderate  Patient tolerance: Patient tolerated the procedure well with no immediate complications.     MDM   Final diagnoses:  None    1. Hidradenitis suppurativa  Uncomplicated I&D of recurrent abscesses to axilla bilaterally.    Charlann Lange, PA-C 09/21/14 0321  Shanon Rosser, MD 09/21/14 [phone]

## 2014-09-21 NOTE — ED Notes (Signed)
drsg applied to bil axilla

## 2014-09-24 ENCOUNTER — Ambulatory Visit: Payer: Self-pay | Admitting: Surgery

## 2014-09-27 ENCOUNTER — Encounter (HOSPITAL_BASED_OUTPATIENT_CLINIC_OR_DEPARTMENT_OTHER): Payer: Self-pay | Admitting: *Deleted

## 2014-09-27 ENCOUNTER — Encounter (HOSPITAL_BASED_OUTPATIENT_CLINIC_OR_DEPARTMENT_OTHER)
Admission: RE | Admit: 2014-09-27 | Discharge: 2014-09-27 | Disposition: A | Discharge status: Home / Self Care | Payer: Medicare Other | Source: Ambulatory Visit | Attending: Surgery | Admitting: Surgery

## 2014-09-27 DIAGNOSIS — R0981 Nasal congestion: Secondary | ICD-10-CM

## 2014-09-27 DIAGNOSIS — F209 Schizophrenia, unspecified: Secondary | ICD-10-CM | POA: Diagnosis not present

## 2014-09-27 DIAGNOSIS — G4730 Sleep apnea, unspecified: Secondary | ICD-10-CM | POA: Diagnosis not present

## 2014-09-27 DIAGNOSIS — Z79899 Other long term (current) drug therapy: Secondary | ICD-10-CM | POA: Diagnosis not present

## 2014-09-27 DIAGNOSIS — F419 Anxiety disorder, unspecified: Secondary | ICD-10-CM | POA: Diagnosis not present

## 2014-09-27 DIAGNOSIS — E780 Pure hypercholesterolemia: Secondary | ICD-10-CM | POA: Diagnosis not present

## 2014-09-27 DIAGNOSIS — L732 Hidradenitis suppurativa: Secondary | ICD-10-CM | POA: Diagnosis present

## 2014-09-27 DIAGNOSIS — I10 Essential (primary) hypertension: Secondary | ICD-10-CM | POA: Diagnosis not present

## 2014-09-27 DIAGNOSIS — J45909 Unspecified asthma, uncomplicated: Secondary | ICD-10-CM | POA: Diagnosis not present

## 2014-09-27 DIAGNOSIS — F319 Bipolar disorder, unspecified: Secondary | ICD-10-CM | POA: Diagnosis not present

## 2014-09-27 DIAGNOSIS — I509 Heart failure, unspecified: Secondary | ICD-10-CM | POA: Diagnosis not present

## 2014-09-27 DIAGNOSIS — E119 Type 2 diabetes mellitus without complications: Secondary | ICD-10-CM | POA: Diagnosis not present

## 2014-09-27 DIAGNOSIS — K219 Gastro-esophageal reflux disease without esophagitis: Secondary | ICD-10-CM | POA: Diagnosis not present

## 2014-09-27 DIAGNOSIS — F17200 Nicotine dependence, unspecified, uncomplicated: Secondary | ICD-10-CM | POA: Diagnosis not present

## 2014-09-27 DIAGNOSIS — I252 Old myocardial infarction: Secondary | ICD-10-CM | POA: Diagnosis not present

## 2014-09-27 DIAGNOSIS — M1990 Unspecified osteoarthritis, unspecified site: Secondary | ICD-10-CM | POA: Diagnosis not present

## 2014-09-27 DIAGNOSIS — Z6839 Body mass index (BMI) 39.0-39.9, adult: Secondary | ICD-10-CM | POA: Diagnosis not present

## 2014-09-27 DIAGNOSIS — J449 Chronic obstructive pulmonary disease, unspecified: Secondary | ICD-10-CM | POA: Diagnosis not present

## 2014-09-27 HISTORY — DX: Nasal congestion: R09.81

## 2014-09-27 LAB — BASIC METABOLIC PANEL
Anion gap: 10 (ref 5–15)
BUN: 9 mg/dL (ref 6–20)
CALCIUM: 9.5 mg/dL (ref 8.9–10.3)
CHLORIDE: 107 mmol/L (ref 101–111)
CO2: 21 mmol/L — ABNORMAL LOW (ref 22–32)
CREATININE: 0.65 mg/dL (ref 0.44–1.00)
GFR calc non Af Amer: >60 mL/min (ref >60)
Glucose, Bld: 200 mg/dL — ABNORMAL HIGH (ref 65–99)
POTASSIUM: 4.4 mmol/L (ref 3.5–5.1)
SODIUM: 138 mmol/L (ref 135–145)

## 2014-09-27 NOTE — Pre-Procedure Instructions (Signed)
To come for BMET and EKG 

## 2014-09-28 ENCOUNTER — Ambulatory Visit (HOSPITAL_BASED_OUTPATIENT_CLINIC_OR_DEPARTMENT_OTHER): Payer: Medicare Other | Admitting: Certified Registered"

## 2014-09-28 ENCOUNTER — Ambulatory Visit (HOSPITAL_BASED_OUTPATIENT_CLINIC_OR_DEPARTMENT_OTHER)
Admission: RE | Admit: 2014-09-28 | Discharge: 2014-09-28 | Disposition: A | Discharge status: Home / Self Care | Payer: Medicare Other | Source: Ambulatory Visit | Attending: Surgery | Admitting: Surgery

## 2014-09-28 ENCOUNTER — Encounter (HOSPITAL_BASED_OUTPATIENT_CLINIC_OR_DEPARTMENT_OTHER)
Admission: RE | Disposition: A | Discharge status: Home / Self Care | Payer: Self-pay | Source: Ambulatory Visit | Attending: Surgery

## 2014-09-28 ENCOUNTER — Encounter (HOSPITAL_BASED_OUTPATIENT_CLINIC_OR_DEPARTMENT_OTHER): Payer: Self-pay | Admitting: Surgery

## 2014-09-28 DIAGNOSIS — Z6839 Body mass index (BMI) 39.0-39.9, adult: Secondary | ICD-10-CM | POA: Insufficient documentation

## 2014-09-28 DIAGNOSIS — I509 Heart failure, unspecified: Secondary | ICD-10-CM | POA: Insufficient documentation

## 2014-09-28 DIAGNOSIS — L732 Hidradenitis suppurativa: Secondary | ICD-10-CM | POA: Diagnosis present

## 2014-09-28 DIAGNOSIS — J449 Chronic obstructive pulmonary disease, unspecified: Secondary | ICD-10-CM | POA: Diagnosis not present

## 2014-09-28 DIAGNOSIS — K219 Gastro-esophageal reflux disease without esophagitis: Secondary | ICD-10-CM | POA: Insufficient documentation

## 2014-09-28 DIAGNOSIS — F419 Anxiety disorder, unspecified: Secondary | ICD-10-CM | POA: Insufficient documentation

## 2014-09-28 DIAGNOSIS — F209 Schizophrenia, unspecified: Secondary | ICD-10-CM | POA: Insufficient documentation

## 2014-09-28 DIAGNOSIS — J45909 Unspecified asthma, uncomplicated: Secondary | ICD-10-CM | POA: Diagnosis not present

## 2014-09-28 DIAGNOSIS — E780 Pure hypercholesterolemia: Secondary | ICD-10-CM | POA: Insufficient documentation

## 2014-09-28 DIAGNOSIS — F17200 Nicotine dependence, unspecified, uncomplicated: Secondary | ICD-10-CM | POA: Insufficient documentation

## 2014-09-28 DIAGNOSIS — E119 Type 2 diabetes mellitus without complications: Secondary | ICD-10-CM | POA: Diagnosis not present

## 2014-09-28 DIAGNOSIS — M1990 Unspecified osteoarthritis, unspecified site: Secondary | ICD-10-CM | POA: Insufficient documentation

## 2014-09-28 DIAGNOSIS — I252 Old myocardial infarction: Secondary | ICD-10-CM | POA: Insufficient documentation

## 2014-09-28 DIAGNOSIS — Z79899 Other long term (current) drug therapy: Secondary | ICD-10-CM | POA: Insufficient documentation

## 2014-09-28 DIAGNOSIS — F319 Bipolar disorder, unspecified: Secondary | ICD-10-CM | POA: Insufficient documentation

## 2014-09-28 DIAGNOSIS — G4730 Sleep apnea, unspecified: Secondary | ICD-10-CM | POA: Insufficient documentation

## 2014-09-28 DIAGNOSIS — I10 Essential (primary) hypertension: Secondary | ICD-10-CM | POA: Insufficient documentation

## 2014-09-28 HISTORY — DX: Dissociative identity disorder: F44.81

## 2014-09-28 HISTORY — DX: Nasal congestion: R09.81

## 2014-09-28 HISTORY — DX: Hidradenitis suppurativa: L73.2

## 2014-09-28 HISTORY — DX: Gastro-esophageal reflux disease without esophagitis: K21.9

## 2014-09-28 HISTORY — DX: Bipolar disorder, unspecified: F31.9

## 2014-09-28 HISTORY — DX: Unspecified asthma, uncomplicated: J45.909

## 2014-09-28 HISTORY — DX: Type 2 diabetes mellitus without complications: E11.9

## 2014-09-28 HISTORY — DX: Schizophrenia, unspecified: F20.9

## 2014-09-28 HISTORY — DX: Tachycardia, unspecified: R00.0

## 2014-09-28 HISTORY — DX: Unspecified osteoarthritis, unspecified site: M19.90

## 2014-09-28 HISTORY — DX: Anxiety disorder, unspecified: F41.9

## 2014-09-28 HISTORY — DX: Dysphagia, unspecified: R13.10

## 2014-09-28 HISTORY — PX: HYDRADENITIS EXCISION: SHX5243

## 2014-09-28 HISTORY — DX: Sleep apnea, unspecified: G47.30

## 2014-09-28 LAB — GLUCOSE, CAPILLARY
GLUCOSE-CAPILLARY: 185 mg/dL — AB (ref 65–99)
Glucose-Capillary: 179 mg/dL — ABNORMAL HIGH (ref 65–99)

## 2014-09-28 LAB — POCT HEMOGLOBIN-HEMACUE: HEMOGLOBIN: 15.5 g/dL — AB (ref 12.0–15.0)

## 2014-09-28 SURGERY — EXCISION, HIDRADENITIS, AXILLA
Anesthesia: General | Site: Axilla | Laterality: Bilateral

## 2014-09-28 MED ORDER — CEFAZOLIN SODIUM 1-5 GM-% IV SOLN
INTRAVENOUS | Status: AC
  Filled 2014-09-28: qty 50

## 2014-09-28 MED ORDER — OXYCODONE HCL 5 MG PO TABS
5.0000 mg | ORAL_TABLET | Freq: Once | ORAL | Status: AC | PRN
  Administered 2014-09-28: 5 mg via ORAL

## 2014-09-28 MED ORDER — MORPHINE SULFATE 10 MG/ML IJ SOLN
INTRAMUSCULAR | Status: AC
Start: 2014-09-28 — End: 2014-09-28
  Filled 2014-09-28: qty 1

## 2014-09-28 MED ORDER — BUPIVACAINE HCL 0.5 % IJ SOLN
INTRAMUSCULAR | Status: DC | PRN
  Administered 2014-09-28: 30 mL

## 2014-09-28 MED ORDER — BUPIVACAINE-EPINEPHRINE (PF) 0.5% -1:200000 IJ SOLN
INTRAMUSCULAR | Status: AC
  Filled 2014-09-28: qty 30

## 2014-09-28 MED ORDER — ONDANSETRON HCL 4 MG/2ML IJ SOLN
INTRAMUSCULAR | Status: DC | PRN
  Administered 2014-09-28: 4 mg via INTRAVENOUS

## 2014-09-28 MED ORDER — LIDOCAINE HCL (CARDIAC) 20 MG/ML IV SOLN
INTRAVENOUS | Status: DC | PRN
  Administered 2014-09-28: 80 mg via INTRAVENOUS

## 2014-09-28 MED ORDER — ALBUTEROL SULFATE HFA 108 (90 BASE) MCG/ACT IN AERS
INHALATION_SPRAY | RESPIRATORY_TRACT | Status: DC | PRN
  Administered 2014-09-28 (×2): 2 via RESPIRATORY_TRACT

## 2014-09-28 MED ORDER — HYDROMORPHONE HCL 1 MG/ML IJ SOLN
INTRAMUSCULAR | Status: AC
  Filled 2014-09-28: qty 1

## 2014-09-28 MED ORDER — SCOPOLAMINE 1 MG/3DAYS TD PT72: 1.0000 | MEDICATED_PATCH | Freq: Once | TRANSDERMAL | Status: DC | PRN

## 2014-09-28 MED ORDER — FENTANYL CITRATE (PF) 100 MCG/2ML IJ SOLN
INTRAMUSCULAR | Status: AC
  Filled 2014-09-28: qty 6

## 2014-09-28 MED ORDER — POVIDONE-IODINE 10 % EX SOLN
CUTANEOUS | Status: DC | PRN
  Administered 2014-09-28: 1 via TOPICAL

## 2014-09-28 MED ORDER — BUPIVACAINE HCL (PF) 0.5 % IJ SOLN
INTRAMUSCULAR | Status: AC
Start: 2014-09-28 — End: 2014-09-28
  Filled 2014-09-28: qty 30

## 2014-09-28 MED ORDER — CIPROFLOXACIN IN D5W 400 MG/200ML IV SOLN
INTRAVENOUS | Status: DC | PRN
  Administered 2014-09-28: 400 mg via INTRAVENOUS

## 2014-09-28 MED ORDER — OXYCODONE HCL 5 MG/5ML PO SOLN: 5.0000 mg | Freq: Once | ORAL | Status: AC | PRN

## 2014-09-28 MED ORDER — OXYCODONE HCL 5 MG PO TABS
ORAL_TABLET | ORAL | Status: AC
  Filled 2014-09-28: qty 1

## 2014-09-28 MED ORDER — HYDROCODONE-ACETAMINOPHEN 5-325 MG PO TABS: 1.0000 | ORAL_TABLET | ORAL | Status: DC | PRN

## 2014-09-28 MED ORDER — SUCCINYLCHOLINE CHLORIDE 20 MG/ML IJ SOLN
INTRAMUSCULAR | Status: DC | PRN
  Administered 2014-09-28: 100 mg via INTRAVENOUS

## 2014-09-28 MED ORDER — LACTATED RINGERS IV SOLN
INTRAVENOUS | Status: DC
  Administered 2014-09-28 (×2): via INTRAVENOUS

## 2014-09-28 MED ORDER — MIDAZOLAM HCL 2 MG/2ML IJ SOLN: 1.0000 mg | INTRAMUSCULAR | Status: DC | PRN

## 2014-09-28 MED ORDER — HYDROMORPHONE HCL 1 MG/ML IJ SOLN
0.2500 mg | INTRAMUSCULAR | Status: DC | PRN
  Administered 2014-09-28 (×4): 0.5 mg via INTRAVENOUS

## 2014-09-28 MED ORDER — SUFENTANIL CITRATE 50 MCG/ML IV SOLN
INTRAVENOUS | Status: DC | PRN
  Administered 2014-09-28: 15 ug via INTRAVENOUS

## 2014-09-28 MED ORDER — GLYCOPYRROLATE 0.2 MG/ML IJ SOLN: 0.2000 mg | Freq: Once | INTRAMUSCULAR | Status: DC | PRN

## 2014-09-28 MED ORDER — ACETAMINOPHEN 325 MG PO TABS
325.0000 mg | ORAL_TABLET | ORAL | Status: DC | PRN
Start: 2014-09-28 — End: 2014-09-28

## 2014-09-28 MED ORDER — DEXAMETHASONE SODIUM PHOSPHATE 4 MG/ML IJ SOLN
INTRAMUSCULAR | Status: DC | PRN
  Administered 2014-09-28: 4 mg via INTRAVENOUS

## 2014-09-28 MED ORDER — SUFENTANIL CITRATE 50 MCG/ML IV SOLN
INTRAVENOUS | Status: AC
  Filled 2014-09-28: qty 1

## 2014-09-28 MED ORDER — MIDAZOLAM HCL 2 MG/2ML IJ SOLN
INTRAMUSCULAR | Status: AC
  Filled 2014-09-28: qty 2

## 2014-09-28 MED ORDER — PROPOFOL 10 MG/ML IV BOLUS
INTRAVENOUS | Status: DC | PRN
  Administered 2014-09-28: 100 mg via INTRAVENOUS
  Administered 2014-09-28: 200 mg via INTRAVENOUS

## 2014-09-28 MED ORDER — CIPROFLOXACIN IN D5W 400 MG/200ML IV SOLN
INTRAVENOUS | Status: AC
  Filled 2014-09-28: qty 200

## 2014-09-28 MED ORDER — DEXTROSE 5 % IV SOLN: 3.0000 g | INTRAVENOUS | Status: DC

## 2014-09-28 MED ORDER — ACETAMINOPHEN 160 MG/5ML PO SOLN: 325.0000 mg | ORAL | Status: DC | PRN

## 2014-09-28 MED ORDER — CEFAZOLIN SODIUM-DEXTROSE 2-3 GM-% IV SOLR
INTRAVENOUS | Status: AC
  Filled 2014-09-28: qty 50

## 2014-09-28 SURGICAL SUPPLY — 39 items
APL SKNCLS STERI-STRIP NONHPOA (GAUZE/BANDAGES/DRESSINGS)
BENZOIN TINCTURE PRP APPL 2/3 (GAUZE/BANDAGES/DRESSINGS) IMPLANT
BLADE CLIPPER SURG (BLADE) ×2 IMPLANT
BLADE SURG 15 STRL LF DISP TIS (BLADE) ×1 IMPLANT
BLADE SURG 15 STRL SS (BLADE) ×3
CANISTER SUCT 1200ML W/VALVE (MISCELLANEOUS) ×3 IMPLANT
CHLORAPREP W/TINT 26ML (MISCELLANEOUS) ×9 IMPLANT
CLOSURE WOUND 1/2 X4 (GAUZE/BANDAGES/DRESSINGS)
COVER BACK TABLE 60X90IN (DRAPES) ×3 IMPLANT
COVER MAYO STAND STRL (DRAPES) ×3 IMPLANT
DECANTER SPIKE VIAL GLASS SM (MISCELLANEOUS) IMPLANT
DRAPE LAPAROTOMY 100X72 PEDS (DRAPES) ×3 IMPLANT
DRAPE UTILITY XL STRL (DRAPES) ×5 IMPLANT
ELECT REM PT RETURN 9FT ADLT (ELECTROSURGICAL) ×3
ELECTRODE REM PT RTRN 9FT ADLT (ELECTROSURGICAL) ×1 IMPLANT
GLOVE BIO SURGEON STRL SZ 6.5 (GLOVE) ×1 IMPLANT
GLOVE BIO SURGEONS STRL SZ 6.5 (GLOVE) ×1
GLOVE BIOGEL PI IND STRL 7.0 (GLOVE) IMPLANT
GLOVE BIOGEL PI INDICATOR 7.0 (GLOVE) ×2
GLOVE EXAM NITRILE EXT CUFF MD (GLOVE) ×4 IMPLANT
GLOVE SURG ORTHO 8.0 STRL STRW (GLOVE) ×3 IMPLANT
GOWN STRL REUS W/ TWL LRG LVL3 (GOWN DISPOSABLE) ×1 IMPLANT
GOWN STRL REUS W/ TWL XL LVL3 (GOWN DISPOSABLE) ×1 IMPLANT
GOWN STRL REUS W/TWL LRG LVL3 (GOWN DISPOSABLE) ×3
GOWN STRL REUS W/TWL XL LVL3 (GOWN DISPOSABLE) ×3
NDL HYPO 25X1 1.5 SAFETY (NEEDLE) ×1 IMPLANT
NEEDLE HYPO 25X1 1.5 SAFETY (NEEDLE) ×3 IMPLANT
NS IRRIG 1000ML POUR BTL (IV SOLUTION) ×1 IMPLANT
PACK BASIN DAY SURGERY FS (CUSTOM PROCEDURE TRAY) ×3 IMPLANT
PENCIL BUTTON HOLSTER BLD 10FT (ELECTRODE) ×3 IMPLANT
SLEEVE SCD COMPRESS KNEE MED (MISCELLANEOUS) IMPLANT
STRIP CLOSURE SKIN 1/2X4 (GAUZE/BANDAGES/DRESSINGS) IMPLANT
SUT VICRYL 4-0 PS2 18IN ABS (SUTURE) ×1 IMPLANT
SYR CONTROL 10ML LL (SYRINGE) ×3 IMPLANT
TOWEL OR 17X24 6PK STRL BLUE (TOWEL DISPOSABLE) ×3 IMPLANT
TOWEL OR NON WOVEN STRL DISP B (DISPOSABLE) ×1 IMPLANT
TUBE CONNECTING 20'X1/4 (TUBING) ×1
TUBE CONNECTING 20X1/4 (TUBING) ×2 IMPLANT
YANKAUER SUCT BULB TIP NO VENT (SUCTIONS) ×3 IMPLANT

## 2014-09-28 NOTE — Interval H&P Note (Signed)
History and Physical Interval Note:  09/28/2014 11:04 AM  Rebecca Moyer  has presented today for surgery, with the diagnosis of HIDRADENITIS BILATERAL AXILLA.  The various methods of treatment have been discussed with the patient and family. On exam this morning, the axillary lesions are stable, are marked, and will be excised.  The lesion on the medial left buttock is soft and non-tender and appears healed.  We will NOT excise the buttock lesion today.  After consideration of risks, benefits and other options for treatment, the patient has consented to  PROCEDURE:  Excision of bilateral axillary hidradenitis  as a surgical intervention .  The patient's history has been reviewed, patient examined, no change in status, stable for surgery.  I have reviewed the patient's chart and labs.  Questions were answered to the patient's satisfaction.    Earnstine Regal, MD, [facility] Office: [place]

## 2014-09-28 NOTE — Discharge Instructions (Signed)

## 2014-09-28 NOTE — H&P (Signed)
General Surgery [facility]  [name] 09/09/2014 3:08 PM Patient #: [id number] DOB: 1985-08-14 Married / Language: Rebecca Moyer / Race: White Female  History of Present Illness Rebecca Moyer; 09/09/2014 4:37 PM) Patient words: abcess on buttocks.  The patient is a 29 year old female who presents with a complaint of Hidradenitis. Patient is referred by Dr. Florina Ou at the emergency department for evaluation of chronic hidradenitis. Patient has had long-standing hidradenitis involving both axillae and the buttocks. She has been to the emergency department on multiple occasions where she has undergone incision and drainage procedures. She has been treated with oral antibiotics on multiple occasions, most recently doxycycline. She does have significant allergies to penicillins and triamterene. This limits her antibiotic choices. Patient has had no surgical procedures performed for her hidradenitis. Patient presents today to discuss definitive surgical treatment.   Other Problems Rebecca Lorenzo, LPN; 2/99/3716 9:67 PM) Anxiety Disorder Arthritis Asthma Back Pain Chest pain Chronic Obstructive Lung Disease Depression Diabetes Mellitus Gastroesophageal Reflux Disease High blood pressure Hypercholesterolemia Other disease, cancer, significant illness Sleep Apnea  Past Surgical History Rebecca Lorenzo, LPN; 8/93/8101 7:51 PM) Foot Surgery Bilateral. Oral Surgery  Diagnostic Studies History Rebecca Lorenzo, LPN; 0/25/8527 7:82 PM) Colonoscopy never Mammogram never Pap Smear 1-5 years ago  Allergies Rebecca Lorenzo, LPN; 07/03/5359 4:43 PM) FentaNYL *ANALGESICS - OPIOID* Shortness of breath, Swelling. Penicillins Hives, Itching, Swelling. Ultram *ANALGESICS - OPIOID* Hives. Triamterene *DIURETICS* Shortness of breath. Naproxen *ANALGESICS - ANTI-INFLAMMATORY* Itching.  Medication History Rebecca Lorenzo, LPN; 1/54/0086 7:61 PM) Albuterol  Sulfate (108 (90 Base)MCG/ACT Aero Pow Br Act, Inhalation) Active. Albuterol Sulfate ((2.5 MG/3ML)0.083% Nebulized Soln, Inhalation) Active. Xanax (1MG  Tablet, Oral) Active. Tenormin (50MG  Tablet, Oral) Active. Cardizem CD (120MG  Capsule ER 24HR, Oral) Active. Tricor (145MG  Tablet, Oral) Active. Haldol (0.5MG  Tablet, Oral) Active. Prevacid (30MG  Capsule DR, Oral) Active. Victoza (18MG /3ML Soln Pen-inj, Subcutaneous) Active. MetFORMIN HCl (1000MG  Tablet, Oral) Active. Remeron (30MG  Tablet, Oral) Active. Medications Reconciled  Social History Rebecca Lorenzo, LPN; 9/50/9326 7:12 PM) No alcohol use No caffeine use No drug use Tobacco use Current every day smoker.  Family History Rebecca Lorenzo, LPN; 4/58/0998 3:38 PM) Arthritis Brother, Father, Mother. Depression Brother, Father, Mother. Diabetes Mellitus Father. Heart Disease Father. Heart disease in female family member before age 85 Hypertension Brother, Father, Mother. Migraine Headache Father. Seizure disorder Father, Mother. Thyroid problems Mother.  Pregnancy / Birth History Rebecca Lorenzo, LPN; 2/50/5397 6:73 PM) Age at menarche 41 years. Contraceptive History Oral contraceptives. Gravida 0 Irregular periods Para 0  Review of Systems Rebecca Billings Dockery LPN; 06/28/3788 2:40 PM) General Present- Chills, Night Sweats and Weight Gain. Not Present- Appetite Loss, Fatigue, Fever and Weight Loss. Skin Present- New Lesions. Not Present- Change in Wart/Mole, Dryness, Hives, Jaundice, Non-Healing Wounds, Rash and Ulcer. HEENT Present- Wears glasses/contact lenses. Not Present- Earache, Hearing Loss, Hoarseness, Nose Bleed, Oral Ulcers, Ringing in the Ears, Seasonal Allergies, Sinus Pain, Sore Throat, Visual Disturbances and Yellow Eyes. Respiratory Present- Snoring. Not Present- Bloody sputum, Chronic Cough, Difficulty Breathing and Wheezing. Cardiovascular Present- Difficulty Breathing Lying Down, Leg  Cramps and Rapid Heart Rate. Not Present- Chest Pain, Palpitations, Shortness of Breath and Swelling of Extremities. Gastrointestinal Present- Abdominal Pain and Difficulty Swallowing. Not Present- Bloating, Bloody Stool, Change in Bowel Habits, Chronic diarrhea, Constipation, Excessive gas, Gets full quickly at meals, Hemorrhoids, Indigestion, Nausea, Rectal Pain and Vomiting. Female Genitourinary Present- Frequency. Not Present- Nocturia, Painful Urination, Pelvic Pain and Urgency. Musculoskeletal Present- Back Pain and Joint Stiffness. Not  Present- Joint Pain, Muscle Pain, Muscle Weakness and Swelling of Extremities. Neurological Present- Trouble walking. Not Present- Decreased Memory, Fainting, Headaches, Numbness, Seizures, Tingling, Tremor and Weakness. Psychiatric Present- Anxiety, Bipolar, Change in Sleep Pattern, Depression and Fearful. Not Present- Frequent crying.   Vitals Rebecca Billings Dockery LPN; 8/85/0277 4:12 PM) 09/09/2014 3:09 PM Weight: 266.2 lb Height: 69in Body Surface Area: 2.42 m Body Mass Index: 39.31 kg/m Temp.: 98.34F(Oral)  Pulse: 81 (Regular)  BP: 124/80 (Sitting, Left Arm, Standard)    Physical Exam Rebecca Moyer; 09/09/2014 4:38 PM) The physical exam findings are as follows: Note:General - appears comfortable, no distress; not diaphorectic  HEENT - normocephalic; sclerae clear, gaze conjugate; mucous membranes moist, dentition good; voice normal  Neck - symmetric on extension; no palpable anterior or posterior cervical adenopathy; no palpable masses in the thyroid bed  Chest - clear bilaterally without rhonchi, rales, or wheeze  Cor - regular rhythm with normal rate; no significant murmur  GU - on the medial left buttock is a firm hard indurated lesion measuring approximately 1.5 cm in diameter consistent with chronic hidradenitis; there are no lesions in the perianal region; there are few small sinus tracts in the upper natal cleft but otherwise  no evidence of pilonidal cyst  Ext - non-tender without significant edema or lymphedema; bilateral axillae demonstrate changes consistent with chronic hidradenitis with chronic sinus tracts and areas of induration and tenderness  Neuro - grossly intact; no tremor    Assessment & Plan Rebecca Moyer; 09/09/2014 4:42 PM) HIDRADENITIS (705.83  L73.2) Current Plans  Patient presents on referral from the emergency department with hidradenitis involving bilateral axillae and the buttocks. This is been a chronic problem. She is undergone multiple incision and drainage procedures. She is not experiencing much response from oral antibiotics. She presents today to discuss definitive surgical management.  I have recommended that the patient go to the operating room and while under anesthesia, have excision of the involved skin and subcutaneous tissues from both axillae and from the medial left buttock. This can be performed as an outpatient surgical procedure. Wounds will be left open and will require wet-to-dry dressing changes twice daily as well as a daily shower. Patient understands. She has help at home for dressing changes. We will make arrangements for surgery at a time convenient for the patient in the near future.  The risks and benefits of the procedure have been discussed at length with the patient. The patient understands the proposed procedure, potential alternative treatments, and the course of recovery to be expected. All of the patient's questions have been answered at this time. The patient wishes to proceed with surgery.  Rebecca Regal, Moyer, [facility] Office: [phone]

## 2014-09-28 NOTE — Anesthesia Procedure Notes (Addendum)
Procedure Name: Intubation Date/Time: 09/28/2014 11:30 AM Performed by: Baxter Flattery Pre-anesthesia Checklist: Patient identified, Emergency Drugs available, Suction available and Patient being monitored Patient Re-evaluated:Patient Re-evaluated prior to inductionOxygen Delivery Method: Circle System Utilized Preoxygenation: Pre-oxygenation with 100% oxygen Intubation Type: IV induction Ventilation: Mask ventilation without difficulty Laryngoscope Size: Mac and 3 Grade View: Grade I Tube type: Oral Tube size: 7.0 mm Number of attempts: 1 Airway Equipment and Method: Stylet and Oral airway Placement Confirmation: ETT inserted through vocal cords under direct vision,  positive ETCO2 and breath sounds checked- equal and bilateral Secured at: 22 cm Tube secured with: Tape Dental Injury: Teeth and Oropharynx as per pre-operative assessment    Procedure Name: LMA Insertion Date/Time: 09/28/2014 11:17 AM Performed by: Baxter Flattery Pre-anesthesia Checklist: Patient identified, Emergency Drugs available, Suction available and Patient being monitored Patient Re-evaluated:Patient Re-evaluated prior to inductionOxygen Delivery Method: Circle System Utilized Preoxygenation: Pre-oxygenation with 100% oxygen Intubation Type: IV induction Ventilation: Mask ventilation without difficulty LMA: LMA inserted LMA Size: 4.0 Number of attempts: 1 Airway Equipment and Method: Bite block Placement Confirmation: positive ETCO2 and breath sounds checked- equal and bilateral Tube secured with: Tape Dental Injury: Teeth and Oropharynx as per pre-operative assessment

## 2014-09-28 NOTE — Anesthesia Postprocedure Evaluation (Signed)
Anesthesia Post Note  Patient: Rebecca Moyer  Procedure(s) Performed: Procedure(s) (LRB): EXCISION HIDRADENITIS BILATERAL AXILLARY (Bilateral)  Anesthesia type: general  Patient location: PACU  Post pain: Pain level controlled  Post assessment: Patient's Cardiovascular Status Stable  Last Vitals:  Filed Vitals:   09/28/14 1330  BP: 129/64  Pulse: 80  Temp:   Resp: 16    Post vital signs: Reviewed and stable  Level of consciousness: sedated  Complications: No apparent anesthesia complications

## 2014-09-28 NOTE — Transfer of Care (Signed)
Immediate Anesthesia Transfer of Care Note  Patient: Rebecca Moyer  Procedure(s) Performed: Procedure(s): EXCISION HIDRADENITIS BILATERAL AXILLARY (Bilateral)  Patient Location: PACU  Anesthesia Type:General  Level of Consciousness: awake, alert  and oriented  Airway & Oxygen Therapy: Patient Spontanous Breathing and Patient connected to face mask oxygen  Post-op Assessment: Report given to RN, Post -op Vital signs reviewed and stable and Patient moving all extremities  Post vital signs: Reviewed and stable  Last Vitals:  Filed Vitals:   09/28/14 1210  BP: 156/65  Pulse: 92  Temp: 36.8 C  Resp:     Complications: No apparent anesthesia complications

## 2014-09-28 NOTE — Brief Op Note (Signed)
09/28/2014  12:06 PM  PATIENT:  Rebecca Moyer  29 y.o. female  PRE-OPERATIVE DIAGNOSIS:  HIDRADENITIS BILATERAL AXILLA  POST-OPERATIVE DIAGNOSIS:  HIDRADENITIS BILATERAL AXILLA  PROCEDURE:  Procedure(s): EXCISION HIDRADENITIS BILATERAL AXILLARY (Bilateral)  SURGEON:  Surgeon(s) and Role:    * Armandina Gemma, MD - Primary  ANESTHESIA:   general  EBL:  Total I/O In: 1000 [I.V.:1000] Out: -   BLOOD ADMINISTERED:none  DRAINS: none   LOCAL MEDICATIONS USED:  MARCAINE     SPECIMEN:  Excision  DISPOSITION OF SPECIMEN:  PATHOLOGY  COUNTS:  YES  TOURNIQUET:  * No tourniquets in log *  DICTATION: .Other Dictation: Dictation Number [id number]  PLAN OF CARE: Discharge to home after PACU  PATIENT DISPOSITION:  PACU - hemodynamically stable.   Delay start of Pharmacological VTE agent (>24hrs) due to surgical blood loss or risk of bleeding: yes  Earnstine Regal, MD, [facility] Office: [phone]

## 2014-09-28 NOTE — Anesthesia Preprocedure Evaluation (Signed)
Anesthesia Evaluation  Patient identified by MRN, date of birth, ID band Patient awake    Reviewed: Allergy & Precautions, NPO status , Patient's Chart, lab work & pertinent test results  History of Anesthesia Complications Negative for: history of anesthetic complications  Airway Mallampati: II  TM Distance: >3 FB Neck ROM: Full    Dental  (+) Teeth Intact   Pulmonary asthma , sleep apnea and Continuous Positive Airway Pressure Ventilation , Current Smoker,  breath sounds clear to auscultation        Cardiovascular hypertension, Pt. on medications and Pt. on home beta blockers - angina- Past MI and - CHF Rhythm:Regular     Neuro/Psych PSYCHIATRIC DISORDERS Anxiety Depression Bipolar Disorder Schizophrenia negative neurological ROS     GI/Hepatic Neg liver ROS, GERD-  ,  Endo/Other  diabetes, Type 2, Oral Hypoglycemic AgentsMorbid obesity  Renal/GU      Musculoskeletal  (+) Arthritis -,   Abdominal   Peds  Hematology negative hematology ROS (+)   Anesthesia Other Findings   Reproductive/Obstetrics                             Anesthesia Physical Anesthesia Plan  ASA: III  Anesthesia Plan: General   Post-op Pain Management:    Induction: Intravenous  Airway Management Planned: LMA  Additional Equipment: None  Intra-op Plan:   Post-operative Plan: Extubation in OR  Informed Consent: I have reviewed the patients History and Physical, chart, labs and discussed the procedure including the risks, benefits and alternatives for the proposed anesthesia with the patient or authorized representative who has indicated his/her understanding and acceptance.   Dental advisory given  Plan Discussed with: CRNA and Surgeon  Anesthesia Plan Comments:         Anesthesia Quick Evaluation

## 2014-09-29 ENCOUNTER — Encounter (HOSPITAL_COMMUNITY): Payer: Self-pay | Admitting: Emergency Medicine

## 2014-09-29 ENCOUNTER — Emergency Department (HOSPITAL_COMMUNITY): Payer: Medicare Other

## 2014-09-29 ENCOUNTER — Emergency Department (HOSPITAL_COMMUNITY)
Admission: EM | Admit: 2014-09-29 | Discharge: 2014-09-30 | Disposition: A | Discharge status: Home / Self Care | Payer: Medicare Other | Attending: Emergency Medicine | Admitting: Emergency Medicine

## 2014-09-29 ENCOUNTER — Encounter (HOSPITAL_BASED_OUTPATIENT_CLINIC_OR_DEPARTMENT_OTHER): Payer: Self-pay | Admitting: Surgery

## 2014-09-29 DIAGNOSIS — F319 Bipolar disorder, unspecified: Secondary | ICD-10-CM | POA: Insufficient documentation

## 2014-09-29 DIAGNOSIS — I10 Essential (primary) hypertension: Secondary | ICD-10-CM | POA: Diagnosis not present

## 2014-09-29 DIAGNOSIS — K219 Gastro-esophageal reflux disease without esophagitis: Secondary | ICD-10-CM | POA: Diagnosis not present

## 2014-09-29 DIAGNOSIS — Z880 Allergy status to penicillin: Secondary | ICD-10-CM | POA: Insufficient documentation

## 2014-09-29 DIAGNOSIS — L83 Acanthosis nigricans: Secondary | ICD-10-CM | POA: Diagnosis not present

## 2014-09-29 DIAGNOSIS — M170 Bilateral primary osteoarthritis of knee: Secondary | ICD-10-CM | POA: Insufficient documentation

## 2014-09-29 DIAGNOSIS — Z3202 Encounter for pregnancy test, result negative: Secondary | ICD-10-CM | POA: Insufficient documentation

## 2014-09-29 DIAGNOSIS — Z79899 Other long term (current) drug therapy: Secondary | ICD-10-CM | POA: Insufficient documentation

## 2014-09-29 DIAGNOSIS — F419 Anxiety disorder, unspecified: Secondary | ICD-10-CM | POA: Diagnosis not present

## 2014-09-29 DIAGNOSIS — Z7952 Long term (current) use of systemic steroids: Secondary | ICD-10-CM | POA: Diagnosis not present

## 2014-09-29 DIAGNOSIS — Z720 Tobacco use: Secondary | ICD-10-CM | POA: Diagnosis not present

## 2014-09-29 DIAGNOSIS — E288 Other ovarian dysfunction: Secondary | ICD-10-CM | POA: Insufficient documentation

## 2014-09-29 DIAGNOSIS — G8918 Other acute postprocedural pain: Secondary | ICD-10-CM | POA: Diagnosis not present

## 2014-09-29 DIAGNOSIS — E669 Obesity, unspecified: Secondary | ICD-10-CM | POA: Insufficient documentation

## 2014-09-29 DIAGNOSIS — Z8619 Personal history of other infectious and parasitic diseases: Secondary | ICD-10-CM | POA: Insufficient documentation

## 2014-09-29 DIAGNOSIS — Z8669 Personal history of other diseases of the nervous system and sense organs: Secondary | ICD-10-CM | POA: Diagnosis not present

## 2014-09-29 DIAGNOSIS — R739 Hyperglycemia, unspecified: Secondary | ICD-10-CM

## 2014-09-29 DIAGNOSIS — R7989 Other specified abnormal findings of blood chemistry: Secondary | ICD-10-CM

## 2014-09-29 DIAGNOSIS — F209 Schizophrenia, unspecified: Secondary | ICD-10-CM | POA: Diagnosis not present

## 2014-09-29 DIAGNOSIS — J45909 Unspecified asthma, uncomplicated: Secondary | ICD-10-CM | POA: Diagnosis not present

## 2014-09-29 DIAGNOSIS — R5082 Postprocedural fever: Secondary | ICD-10-CM | POA: Insufficient documentation

## 2014-09-29 DIAGNOSIS — R52 Pain, unspecified: Secondary | ICD-10-CM

## 2014-09-29 DIAGNOSIS — R740 Nonspecific elevation of levels of transaminase and lactic acid dehydrogenase [LDH]: Secondary | ICD-10-CM | POA: Diagnosis not present

## 2014-09-29 DIAGNOSIS — L02215 Cutaneous abscess of perineum: Secondary | ICD-10-CM | POA: Diagnosis not present

## 2014-09-29 DIAGNOSIS — Z8742 Personal history of other diseases of the female genital tract: Secondary | ICD-10-CM | POA: Diagnosis not present

## 2014-09-29 DIAGNOSIS — E1165 Type 2 diabetes mellitus with hyperglycemia: Secondary | ICD-10-CM | POA: Diagnosis not present

## 2014-09-29 DIAGNOSIS — Z4801 Encounter for change or removal of surgical wound dressing: Secondary | ICD-10-CM | POA: Diagnosis present

## 2014-09-29 DIAGNOSIS — Z5189 Encounter for other specified aftercare: Secondary | ICD-10-CM

## 2014-09-29 LAB — URINALYSIS, ROUTINE W REFLEX MICROSCOPIC
Bilirubin Urine: NEGATIVE
Glucose, UA: >1000 mg/dL — AB
HGB URINE DIPSTICK: NEGATIVE
KETONES UR: NEGATIVE mg/dL
Leukocytes, UA: NEGATIVE
Nitrite: NEGATIVE
Protein, ur: NEGATIVE mg/dL
Specific Gravity, Urine: 1.031 — ABNORMAL HIGH (ref 1.005–1.030)
Urobilinogen, UA: 0.2 mg/dL (ref 0.0–1.0)
pH: 6 (ref 5.0–8.0)

## 2014-09-29 LAB — BASIC METABOLIC PANEL
ANION GAP: 14 (ref 5–15)
BUN: 11 mg/dL (ref 6–20)
CO2: 19 mmol/L — ABNORMAL LOW (ref 22–32)
Calcium: 9.8 mg/dL (ref 8.9–10.3)
Chloride: 103 mmol/L (ref 101–111)
Creatinine, Ser: 0.7 mg/dL (ref 0.44–1.00)
GFR calc Af Amer: >60 mL/min (ref >60)
GFR calc non Af Amer: >60 mL/min (ref >60)
GLUCOSE: 438 mg/dL — AB (ref 65–99)
POTASSIUM: 4.3 mmol/L (ref 3.5–5.1)
Sodium: 136 mmol/L (ref 135–145)

## 2014-09-29 LAB — CBC WITH DIFFERENTIAL/PLATELET
Basophils Absolute: 0 10*3/uL (ref 0.0–0.1)
Basophils Relative: 0 % (ref 0–1)
EOS ABS: 0.2 10*3/uL (ref 0.0–0.7)
Eosinophils Relative: 2 % (ref 0–5)
HEMATOCRIT: 42 % (ref 36.0–46.0)
Hemoglobin: 14.7 g/dL (ref 12.0–15.0)
LYMPHS ABS: 3.9 10*3/uL (ref 0.7–4.0)
LYMPHS PCT: 34 % (ref 12–46)
MCH: 30.7 pg (ref 26.0–34.0)
MCHC: 35 g/dL (ref 30.0–36.0)
MCV: 87.7 fL (ref 78.0–100.0)
MONOS PCT: 5 % (ref 3–12)
Monocytes Absolute: 0.6 10*3/uL (ref 0.1–1.0)
NEUTROS PCT: 59 % (ref 43–77)
Neutro Abs: 6.9 10*3/uL (ref 1.7–7.7)
Platelets: 273 10*3/uL (ref 150–400)
RBC: 4.79 MIL/uL (ref 3.87–5.11)
RDW: 12.6 % (ref 11.5–15.5)
WBC: 11.6 10*3/uL — AB (ref 4.0–10.5)

## 2014-09-29 LAB — CBG MONITORING, ED: Glucose-Capillary: 312 mg/dL — ABNORMAL HIGH (ref 65–99)

## 2014-09-29 LAB — I-STAT BETA HCG BLOOD, ED (MC, WL, AP ONLY)

## 2014-09-29 LAB — I-STAT CG4 LACTIC ACID, ED
Lactic Acid, Venous: 3.25 mmol/L (ref 0.5–2.0)
Lactic Acid, Venous: 2.51 mmol/L (ref 0.5–2.0)

## 2014-09-29 LAB — URINE MICROSCOPIC-ADD ON

## 2014-09-29 MED ORDER — HYDROMORPHONE HCL 1 MG/ML IJ SOLN
0.5000 mg | Freq: Once | INTRAMUSCULAR | Status: AC
  Administered 2014-09-29: 0.5 mg via INTRAVENOUS
  Filled 2014-09-29: qty 1

## 2014-09-29 MED ORDER — HYDROMORPHONE HCL 1 MG/ML IJ SOLN
0.5000 mg | Freq: Once | INTRAMUSCULAR | Status: AC
  Administered 2014-09-29: 0.5 mg via INTRAVENOUS

## 2014-09-29 MED ORDER — SODIUM CHLORIDE 0.9 % IV BOLUS (SEPSIS)
1000.0000 mL | Freq: Once | INTRAVENOUS | Status: AC
  Administered 2014-09-29: 1000 mL via INTRAVENOUS

## 2014-09-29 MED ORDER — SODIUM CHLORIDE 0.9 % IV BOLUS (SEPSIS)
2000.0000 mL | Freq: Once | INTRAVENOUS | Status: AC
  Administered 2014-09-29: 2000 mL via INTRAVENOUS

## 2014-09-29 MED ORDER — OXYCODONE-ACETAMINOPHEN 5-325 MG PO TABS: ORAL_TABLET | ORAL | Status: DC

## 2014-09-29 MED ORDER — ONDANSETRON HCL 4 MG/2ML IJ SOLN
4.0000 mg | Freq: Once | INTRAMUSCULAR | Status: AC
  Administered 2014-09-29: 4 mg via INTRAVENOUS
  Filled 2014-09-29: qty 2

## 2014-09-29 MED ORDER — DOXYCYCLINE HYCLATE 100 MG IV SOLR
100.0000 mg | Freq: Once | INTRAVENOUS | Status: AC
  Administered 2014-09-29: 100 mg via INTRAVENOUS
  Filled 2014-09-29: qty 100

## 2014-09-29 MED ORDER — DOXYCYCLINE HYCLATE 100 MG PO CAPS: 100.0000 mg | ORAL_CAPSULE | Freq: Two times a day (BID) | ORAL | Status: DC

## 2014-09-29 MED ORDER — LIDOCAINE-EPINEPHRINE 2 %-1:100000 IJ SOLN
20.0000 mL | Freq: Once | INTRAMUSCULAR | Status: AC
  Administered 2014-09-29: 20 mL via INTRADERMAL
  Filled 2014-09-29: qty 1

## 2014-09-29 MED ORDER — MORPHINE SULFATE 4 MG/ML IJ SOLN
4.0000 mg | Freq: Once | INTRAMUSCULAR | Status: AC
  Administered 2014-09-29: 4 mg via INTRAVENOUS
  Filled 2014-09-29: qty 1

## 2014-09-29 NOTE — ED Notes (Signed)
Pt c/o bilateral armpit pain onset yesterday after surgery to remove sweat glands from axilla. Open post-surgical wounds to bilateral axilla with loose clean guaze packing. Wound is open with unapproximated edges, exposed pink, yellow, and white tissue. Pt states she had a fever greater than 102 degrees F today, but that she took motrin for fever.

## 2014-09-29 NOTE — Discharge Instructions (Signed)
Take percocet for breakthrough pain, do not drink alcohol, drive, care for children or do other critical tasks while taking percocet.  Take your antibiotics as directed and to completion. You should never have any leftover antibiotics! Push fluids and stay well hydrated.   Any antibiotic use can reduce the efficacy of hormonal birth control. Please use back up method of contraception.

## 2014-09-29 NOTE — ED Notes (Signed)
Pt bilateral axilla have open wounds from sweat gland removal surgery yesterday.  Both are packed loosely with gauze and covered with ABD pads and paper tape, but both wounds can be easily visualized by moving the dressing to the side.  Pt rates pain as 10/10 and states that her doctor reported that he was unable to prescribe additional pain medications at this time so she needed to come to the ER for evaluation.  Pt denies nausea, vomiting, diarrhea, chills, sweats.She states she did have a fever of 102.3 earlier today but she took ibuprofen and now her temperature is 98.3 orally. No other symptoms at this time.

## 2014-09-29 NOTE — Op Note (Signed)
Rebecca Moyer, DECARLI                  ACCOUNT NO.:  [id number]  MEDICAL RECORD NO.:  [id number]  LOCATION:                               FACILITY:  [facility]  PHYSICIAN:  Earnstine Regal, MD      DATE OF BIRTH:  04/23/1985  DATE OF PROCEDURE:  09/28/2014                              OPERATIVE REPORT   PREOPERATIVE DIAGNOSIS:  Bilateral axillary hidradenitis.  POSTOPERATIVE DIAGNOSIS:  Bilateral axillary hidradenitis.  PROCEDURE: 1. Excision of hidradenitis, left axilla, 9 x 8 cm, skin and     subcutaneous tissue. 2. Excision of hidradenitis, right axilla, 7 x 6 cm, skin and     subcutaneous tissue.  SURGEON:  Earnstine Regal, MD, FACS  ANESTHESIA:  General per Dr. Tedra Senegal.  ESTIMATED BLOOD LOSS:  Minimal.  PREPARATION:  ChloraPrep.  COMPLICATIONS:  None.  INDICATIONS:  The patient is a 29 year old female with advanced hidradenitis involving bilateral axillae.  She now comes to Surgery for excision for definitive management.  BODY OF REPORT:  Procedure was done in OR #6 at the [facility].  The patient was brought to the operating room, placed in supine position on the operating room table.  Following administration of general anesthesia, the patient was prepped and draped in the usual aseptic fashion.  After ascertaining that an adequate level of anesthesia had been achieved, the skin in the left axilla which was marked preoperatively was excised using the electrocautery for hemostasis.  Area measures 9 x 8 cm in dimension and has excised full- thickness skin and subcutaneous tissues.  Hemostasis was achieved with the electrocautery.  Wound was anesthetized with local anesthetic. Wound was packed with 4 x 4 gauze sponges soaked with Betadine solution. Dry gauze dressings were applied.  Next, the right axilla also was previously marked prior to surgery.  The area of involved skin was excised using the electrocautery for hemostasis.  The area measures 7 x 6 cm  and has excised skin and deep subcutaneous tissues. Hemostasis was achieved with the electrocautery.  Local anesthetic was infiltrated circumferentially.  Wound was packed with 4 x 4 gauze sponges saturated with Betadine solution.  Dry gauze dressings were applied.  The patient was awakened from anesthesia and brought to the recovery room.  The patient tolerated the procedure well.   Earnstine Regal, MD, [facility] Office: [phone]    TMG/MEDQ  D:  09/28/2014  T:  09/28/2014  Job:  [id number]

## 2014-09-29 NOTE — Addendum Note (Signed)
Addendum  created 09/29/14 2824 by Ernesta Amble Nayana Lenig, CRNA   Modules edited: Charges VN

## 2014-09-29 NOTE — ED Notes (Signed)
Notified EDP,Yao,MD., pt i-stat lactic acid CG4 results 2.51.

## 2014-09-29 NOTE — ED Provider Notes (Signed)
CSN: [id number]     Arrival date & time 09/29/14  1641 History   First MD Initiated Contact with Patient 09/29/14 1723     Chief Complaint  Patient presents with  . Post-op Problem     (Consider location/radiation/quality/duration/timing/severity/associated sxs/prior Treatment) HPI   Blood pressure 144/75, pulse 87, temperature 98.3 F (36.8 C), temperature source Oral, resp. rate 18, last menstrual period 09/10/2014, SpO2 96 %.  CHERRI YERA is a 29 y.o. female past medical history significant for obesity, diabetes, PCOS, tobacco use, bipolar, complaining of fever (Tmax of 102.3 at noon today). Ms. status post bilateral axillary hidradenitis suuprativa gland removal yesterday. Patient says that she took her temperature orally, she took one ibuprofen and reports that she has severe pain in the chest, radiating to the back diffuse abdominal pain, cervicalgia, pain at the surgical incision site  Past Medical History  Diagnosis Date  . Asthma 06/07/11  . H/O varicella   . H/O amenorrhea 01/27/04  . Irregular periods/menstrual cycles 05/22/04  . Depression 06/07/11  . Hypertension 06/07/11  . PCOS (polycystic ovarian syndrome) 06/07/11  . Obesity 06/07/11  . Smoker 06/07/11  . Diabetes mellitus without complication   . Arthritis     knees  . GERD (gastroesophageal reflux disease)   . Difficulty swallowing solids     due to GERD and slow motility of esophagus, per pt.  . Anxiety   . Non-insulin dependent type 2 diabetes mellitus   . Bipolar disorder   . Schizophrenia   . Depression   . Hypertension     states under control with med., has been on med. x 6 yr.  . Asthma     prn inhaler  . Multiple personality disorder   . Hidradenitis 09/2014    bilateral axilla, left buttock, left groin  . Nasal congestion 09/27/2014  . Tachycardia     no cardiologist, per pt.  . Sleep apnea     does not use CPAP every night, per pt.   Past Surgical History  Procedure Laterality Date  .  Tonsillectomy  06/07/11  . Wisdom tooth extraction  06/07/11  . Adnoids  06/07/11  . Tonsillectomy and adenoidectomy  06/26/2000  . Wisdom tooth extraction    . Hydradenitis excision Bilateral 09/28/2014    Procedure: EXCISION HIDRADENITIS BILATERAL AXILLARY;  Surgeon: Armandina Gemma, MD;  Location: [facility];  Service: General;  Laterality: Bilateral;   Family History  Problem Relation Age of Onset  . Graves' disease Mother    History  Substance Use Topics  . Smoking status: Current Every Day Smoker -- 0.50 packs/day for 8 years    Types: Cigarettes  . Smokeless tobacco: Never Used  . Alcohol Use: No   OB History    Gravida Para Term Preterm AB TAB SAB Ectopic Multiple Living   0 0 0 0 0 0 0 0       Review of Systems  10 systems reviewed and found to be negative, except as noted in the HPI.   Allergies  Fentanyl; Fentanyl; Penicillins; Triamterene; Naproxen; Penicillins; Ultram; Naproxen; Triamterene; and Ultram  Home Medications   Prior to Admission medications   Medication Sig Start Date End Date Taking? Authorizing Provider  albuterol (PROVENTIL HFA;VENTOLIN HFA) 108 (90 BASE) MCG/ACT inhaler Inhale 2 puffs into the lungs every 6 (six) hours as needed for wheezing or shortness of breath. 01/01/14  Yes Ankit Kathrynn Humble, MD  albuterol (PROVENTIL) (2.5 MG/3ML) 0.083% nebulizer solution Take 2.5 mg by  nebulization every 8 (eight) hours as needed. Shortness of breath   Yes Historical Provider, MD  ALPRAZolam (XANAX) 1 MG tablet Take 1 mg by mouth 3 (three) times daily.    Yes Historical Provider, MD  atenolol (TENORMIN) 50 MG tablet Take 50 mg by mouth every evening.  01/20/14  Yes Historical Provider, MD  diltiazem (DILACOR XR) 120 MG 24 hr capsule Take 120 mg by mouth daily.   Yes Historical Provider, MD  fenofibrate (TRICOR) 145 MG tablet Take 145 mg by mouth every evening.    Yes Historical Provider, MD  haloperidol (HALDOL) 0.5 MG tablet Take 0.5 mg by mouth daily.     Yes Historical Provider, MD  ibuprofen (ADVIL,MOTRIN) 200 MG tablet Take 200 mg by mouth every 6 (six) hours as needed for moderate pain.   Yes Historical Provider, MD  lansoprazole (PREVACID) 30 MG capsule Take 30 mg by mouth daily at 12 noon.   Yes Historical Provider, MD  Liraglutide (VICTOZA) 18 MG/3ML SOPN Inject 1.8 mg into the skin daily.    Yes Historical Provider, MD  metFORMIN (GLUCOPHAGE) 1000 MG tablet Take 1,000 mg by mouth daily with breakfast.   Yes Historical Provider, MD  mirtazapine (REMERON) 30 MG tablet Take 30 mg by mouth at bedtime.   Yes Historical Provider, MD  NON FORMULARY cpap   Yes Historical Provider, MD  doxycycline (VIBRAMYCIN) 100 MG capsule Take 1 capsule (100 mg total) by mouth 2 (two) times daily. 09/29/14   Blanka Rockholt, PA-C  HYDROcodone-acetaminophen (NORCO/VICODIN) 5-325 MG per tablet Take 1 tablet by mouth every 6 (six) hours as needed for moderate pain. Patient not taking: Reported on 09/29/2014 09/08/14   Junius Creamer, NP  HYDROcodone-acetaminophen (NORCO/VICODIN) 5-325 MG per tablet Take 1-2 tablets by mouth every 4 (four) hours as needed for moderate pain. Patient not taking: Reported on 09/29/2014 09/28/14   Armandina Gemma, MD  oxyCODONE-acetaminophen (PERCOCET/ROXICET) 5-325 MG per tablet 1 to 2 tabs PO q6hrs  PRN for pain 09/29/14   Elmyra Ricks [place] Basham, PA-C  triamcinolone cream (KENALOG) 0.1 % Apply 1 application topically 2 (two) times daily. Patient not taking: Reported on 08/30/2014 08/21/14   Gregor Hams, MD   BP 122/72 mmHg  Pulse 64  Temp(Src) 98 F (36.7 C) (Oral)  Resp 18  SpO2 97%  LMP 09/10/2014 (Exact Date) Physical Exam  Constitutional: She is oriented to person, place, and time. She appears well-developed and well-nourished. No distress.  obese  HENT:  Head: Normocephalic.  Mouth/Throat: Oropharynx is clear and moist.  Eyes: Conjunctivae and EOM are normal. Pupils are equal, round, and reactive to light.  Neck:  Acanthosis nigricans   Cardiovascular: Normal rate, regular rhythm and intact distal pulses.   Pulmonary/Chest: Effort normal and breath sounds normal. No stridor. No respiratory distress. She has no wheezes. She has no rales. She exhibits no tenderness.  Abdominal: Soft. Bowel sounds are normal. She exhibits no distension and no mass. There is no tenderness. There is no rebound and no guarding.  Genitourinary:     Musculoskeletal: Normal range of motion.  Neurological: She is alert and oriented to person, place, and time.  Skin:  Well healing excisions to bilateral axilla with wet dressing in place. No warmth, discharge or surrounding cellulitis  Psychiatric: She has a normal mood and affect.  Nursing note and vitals reviewed.   ED Course  INCISION AND DRAINAGE Date/Time: 09/30/2014 1:09 AM Performed by: Monico Blitz Authorized by: Monico Blitz Consent: Verbal consent obtained. Consent given  by: patient Patient identity confirmed: verbally with patient Type: abscess Body area: anogenital Location details: perineum Anesthesia: local infiltration Local anesthetic: lidocaine 2% with epinephrine Anesthetic total: 2 ml Patient sedated: no Scalpel size: 11 Incision type: single straight Complexity: simple Drainage: purulent and  bloody Drainage amount: moderate Wound treatment: wound left open Packing material: none   (including critical care time) Labs Review Labs Reviewed  URINALYSIS, ROUTINE W REFLEX MICROSCOPIC (NOT AT [facility]) - Abnormal; Notable for the following:    Specific Gravity, Urine 1.031 (*)    Glucose, UA >1000 (*)    All other components within normal limits  CBC WITH DIFFERENTIAL/PLATELET - Abnormal; Notable for the following:    WBC 11.6 (*)    All other components within normal limits  BASIC METABOLIC PANEL - Abnormal; Notable for the following:    CO2 19 (*)    Glucose, Bld 438 (*)    All other components within normal limits  I-STAT CG4 LACTIC ACID, ED -  Abnormal; Notable for the following:    Lactic Acid, Venous 3.25 (*)    All other components within normal limits  CBG MONITORING, ED - Abnormal; Notable for the following:    Glucose-Capillary 312 (*)    All other components within normal limits  I-STAT CG4 LACTIC ACID, ED - Abnormal; Notable for the following:    Lactic Acid, Venous 2.51 (*)    All other components within normal limits  CULTURE, BLOOD (ROUTINE X 2)  CULTURE, BLOOD (ROUTINE X 2)  URINE CULTURE  URINE MICROSCOPIC-ADD ON  I-STAT BETA HCG BLOOD, ED ([facility], [facility], [facility] ONLY)  CBG MONITORING, ED  I-STAT CG4 LACTIC ACID, ED  I-STAT CG4 LACTIC ACID, ED    Imaging Review Dg Chest 2 View  09/29/2014   CLINICAL DATA:  29 year old female with chest pain  EXAM: CHEST  2 VIEW  COMPARISON:  Chest radiograph dated 03/17/2014  FINDINGS: The heart size and mediastinal contours are within normal limits. Both lungs are clear. The visualized skeletal structures are unremarkable.  IMPRESSION: No active cardiopulmonary disease.   Electronically Signed   By: Anner Crete M.D.   On: 09/29/2014 19:34     EKG Interpretation None      MDM   Final diagnoses:  Pain  Perineal abscess, superficial  Hyperglycemia without ketosis  Visit for wound check  Elevated lactic acid level  Postoperative fever   Filed Vitals:   09/29/14 1655 09/29/14 1935 09/29/14 2025 09/29/14 2314  BP: 144/75  138/72 122/72  Pulse: 87  67 64  Temp: 98.3 F (36.8 C) 98.2 F (36.8 C) 97.9 F (36.6 C) 98 F (36.7 C)  TempSrc: Oral Rectal Oral Oral  Resp: 18  18 18   SpO2: 96%  97% 97%    Medications  lidocaine-EPINEPHrine (XYLOCAINE W/EPI) 2 %-1:100000 (with pres) injection 20 mL (not administered)  sodium chloride 0.9 % bolus 1,000 mL (0 mLs Intravenous Stopped 09/29/14 1913)  morphine 4 MG/ML injection 4 mg (4 mg Intravenous Given 09/29/14 1811)  ondansetron (ZOFRAN) injection 4 mg (4 mg Intravenous Given 09/29/14 1811)  HYDROmorphone (DILAUDID) injection  0.5 mg (0.5 mg Intravenous Given 09/29/14 1915)  sodium chloride 0.9 % bolus 2,000 mL (0 mLs Intravenous Stopped 09/29/14 2205)  HYDROmorphone (DILAUDID) injection 0.5 mg (0.5 mg Intravenous Given 09/29/14 2034)  HYDROmorphone (DILAUDID) injection 0.5 mg (0.5 mg Intravenous Given 09/29/14 2217)  doxycycline (VIBRAMYCIN) 100 mg in dextrose 5 % 250 mL IVPB (100 mg Intravenous New Bag/Given 09/29/14 2301)  ROSALEAH PERSON is a pleasant 29 y.o. female presenting with postoperative fever to 102.3 measured at noon today. Patient states that she took a single over-the-counter Motrin at noon and she is afebrile in the ED. Patient had bilateral axillary hidradenitis suprativa removal by Gerkin yesterday. She's not change the dressing. She does report cough, shortness of breath. Think it is highly unlikely that this is a direct result of the surgery yesterday. Lung sounds are clear to auscultation, she is saturating well on room air, there is no tachypnea or tachycardia. Chest x-ray is without abnormality, her urinalysis is with no signs of infection. Patient is bolused and cultures are drawn. Physical exam shows another abscess on the left lobe be a, this was evaluated by her surgeon but was told that he did not have time to take care of it in the OR yesterday. I and D is performed. There is no significant surrounding cellulitis, no diffuse tenderness to palpation that would suggest extensive disease that this would be the cause of her fever. Lactic acid is mildly elevated at 3.25, her glucose is also significantly elevated at 438 however she has a normal anion gap and no ketones in her urine. Patient states that she has not taken her metformin today.  Repeat rectal temperature shows no fever.  Blood sugar has normalized with 3 L bolus, her lactic acid has improved to 2.51, I discussed this with the attending who is comfortable with discharge lactic acid slightly elevated.  Incision and drainage performed on the  perineal abscess, with had an extensive discussion about wound care including sitz baths.    Case discussed with [facility] surgeon Dr. Marcello Moores who  agrees with plan to DC home with clindamycin considering her poorly controlled diabetes and reported subjective fever at home.  Evaluation does not show pathology that would require ongoing emergent intervention or inpatient treatment. Pt is hemodynamically stable and mentating appropriately. Discussed findings and plan with patient/guardian, who agrees with care plan. All questions answered. Return precautions discussed and outpatient follow up given.   New Prescriptions   DOXYCYCLINE (VIBRAMYCIN) 100 MG CAPSULE    Take 1 capsule (100 mg total) by mouth 2 (two) times daily.   OXYCODONE-ACETAMINOPHEN (PERCOCET/ROXICET) 5-325 MG PER TABLET    1 to 2 tabs PO q6hrs  PRN for pain         Monico Blitz, PA-C 09/30/14 0110  Wandra Arthurs, MD 09/30/14 [phone]

## 2014-09-30 ENCOUNTER — Telehealth: Payer: Self-pay | Admitting: *Deleted

## 2014-09-30 DIAGNOSIS — G8918 Other acute postprocedural pain: Secondary | ICD-10-CM | POA: Diagnosis not present

## 2014-09-30 NOTE — ED Notes (Signed)
Contacted by [facility] stating that pt is attempting to fill prescription for Percocet, #15.  Had prescription for Hydromorphone 2mg  #30 filled on 07/19.  Spoke with Dr. Colin Rhein at [facility] ED who recommends that prescription for Percocet should not be filled until Hydromorphone prescription has run out.  Spoke with pharmacist at [facility] and informed of Dr. Colin Rhein recommendation.  Pharmacist states they will not be filling Percocet prescription.

## 2014-10-01 LAB — URINE CULTURE

## 2014-10-01 NOTE — Addendum Note (Signed)
Addendum  created 10/01/14 0850 by Donnella Bi, RN   Modules edited: Narrator   Narrator:  Narrator: Event Log Edited

## 2014-10-02 ENCOUNTER — Emergency Department (HOSPITAL_COMMUNITY)
Admission: EM | Admit: 2014-10-02 | Discharge: 2014-10-02 | Disposition: A | Discharge status: Home / Self Care | Payer: Medicare Other | Attending: Emergency Medicine | Admitting: Emergency Medicine

## 2014-10-02 ENCOUNTER — Encounter (HOSPITAL_COMMUNITY): Payer: Self-pay | Admitting: Emergency Medicine

## 2014-10-02 DIAGNOSIS — E669 Obesity, unspecified: Secondary | ICD-10-CM | POA: Insufficient documentation

## 2014-10-02 DIAGNOSIS — Z872 Personal history of diseases of the skin and subcutaneous tissue: Secondary | ICD-10-CM | POA: Insufficient documentation

## 2014-10-02 DIAGNOSIS — F319 Bipolar disorder, unspecified: Secondary | ICD-10-CM | POA: Diagnosis not present

## 2014-10-02 DIAGNOSIS — J45909 Unspecified asthma, uncomplicated: Secondary | ICD-10-CM | POA: Insufficient documentation

## 2014-10-02 DIAGNOSIS — K219 Gastro-esophageal reflux disease without esophagitis: Secondary | ICD-10-CM | POA: Diagnosis not present

## 2014-10-02 DIAGNOSIS — Z8669 Personal history of other diseases of the nervous system and sense organs: Secondary | ICD-10-CM | POA: Diagnosis not present

## 2014-10-02 DIAGNOSIS — Z8742 Personal history of other diseases of the female genital tract: Secondary | ICD-10-CM | POA: Diagnosis not present

## 2014-10-02 DIAGNOSIS — M1990 Unspecified osteoarthritis, unspecified site: Secondary | ICD-10-CM | POA: Diagnosis not present

## 2014-10-02 DIAGNOSIS — E119 Type 2 diabetes mellitus without complications: Secondary | ICD-10-CM | POA: Insufficient documentation

## 2014-10-02 DIAGNOSIS — Z79899 Other long term (current) drug therapy: Secondary | ICD-10-CM | POA: Insufficient documentation

## 2014-10-02 DIAGNOSIS — Z720 Tobacco use: Secondary | ICD-10-CM | POA: Insufficient documentation

## 2014-10-02 DIAGNOSIS — Z4801 Encounter for change or removal of surgical wound dressing: Secondary | ICD-10-CM | POA: Diagnosis present

## 2014-10-02 DIAGNOSIS — F419 Anxiety disorder, unspecified: Secondary | ICD-10-CM | POA: Diagnosis not present

## 2014-10-02 DIAGNOSIS — Z8619 Personal history of other infectious and parasitic diseases: Secondary | ICD-10-CM | POA: Insufficient documentation

## 2014-10-02 DIAGNOSIS — F4481 Dissociative identity disorder: Secondary | ICD-10-CM | POA: Diagnosis not present

## 2014-10-02 DIAGNOSIS — I10 Essential (primary) hypertension: Secondary | ICD-10-CM | POA: Insufficient documentation

## 2014-10-02 DIAGNOSIS — G8918 Other acute postprocedural pain: Secondary | ICD-10-CM | POA: Diagnosis not present

## 2014-10-02 DIAGNOSIS — Z880 Allergy status to penicillin: Secondary | ICD-10-CM | POA: Diagnosis not present

## 2014-10-02 LAB — CBG MONITORING, ED: GLUCOSE-CAPILLARY: 137 mg/dL — AB (ref 65–99)

## 2014-10-02 MED ORDER — FLUCONAZOLE 150 MG PO TABS: 150.0000 mg | ORAL_TABLET | Freq: Every day | ORAL | Status: AC

## 2014-10-02 MED ORDER — HYDROMORPHONE HCL 1 MG/ML IJ SOLN
1.0000 mg | Freq: Once | INTRAMUSCULAR | Status: AC
  Administered 2014-10-02: 1 mg via INTRAVENOUS
  Filled 2014-10-02: qty 1

## 2014-10-02 MED ORDER — HYDROMORPHONE HCL 2 MG/ML IJ SOLN
2.0000 mg | Freq: Once | INTRAMUSCULAR | Status: AC
  Administered 2014-10-02: 2 mg via INTRAVENOUS
  Filled 2014-10-02: qty 1

## 2014-10-02 NOTE — ED Notes (Signed)
Pt arrived to the ED with a complaint do wound pain.  Pt has abscess cleaned and worked on Tuesday.  Pt is suppose to have the wounds packed twice a day but the pain is preventing that.  Wound Nurse did repack this am but pm repacking has not been done

## 2014-10-02 NOTE — ED Provider Notes (Signed)
CSN: [id number]     Arrival date & time 10/02/14  0017 History   First MD Initiated Contact with Patient 10/02/14 0155     Chief Complaint  Patient presents with  . Wound Check     (Consider location/radiation/quality/duration/timing/severity/associated sxs/prior Treatment) HPI Comments: Patient with a history of DM, HTN, obesity, hidradenitis with recent wide axillary excision bilaterally presents with uncontrolled pain in both axillary surgical sites. No fever, although she did present on 7/20 with reports temperature at home of 102.9. She was treated at that time and discharged home on Doxycycline with which she states she has been compliant. She is taking oral Dilaudid at home and states that it is too painful for dressing changes and she is concerned infection will take over. Her next follow up with Dr. Harlow Asa is August 1st.  Patient is a 29 y.o. female presenting with wound check.  Wound Check Pertinent negatives include no chest pain, chills, fever or myalgias.    Past Medical History  Diagnosis Date  . Asthma 06/07/11  . H/O varicella   . H/O amenorrhea 01/27/04  . Irregular periods/menstrual cycles 05/22/04  . Depression 06/07/11  . Hypertension 06/07/11  . PCOS (polycystic ovarian syndrome) 06/07/11  . Obesity 06/07/11  . Smoker 06/07/11  . Diabetes mellitus without complication   . Arthritis     knees  . GERD (gastroesophageal reflux disease)   . Difficulty swallowing solids     due to GERD and slow motility of esophagus, per pt.  . Anxiety   . Non-insulin dependent type 2 diabetes mellitus   . Bipolar disorder   . Schizophrenia   . Depression   . Hypertension     states under control with med., has been on med. x 6 yr.  . Asthma     prn inhaler  . Multiple personality disorder   . Hidradenitis 09/2014    bilateral axilla, left buttock, left groin  . Nasal congestion 09/27/2014  . Tachycardia     no cardiologist, per pt.  . Sleep apnea     does not use CPAP every  night, per pt.   Past Surgical History  Procedure Laterality Date  . Tonsillectomy  06/07/11  . Wisdom tooth extraction  06/07/11  . Adnoids  06/07/11  . Tonsillectomy and adenoidectomy  06/26/2000  . Wisdom tooth extraction    . Hydradenitis excision Bilateral 09/28/2014    Procedure: EXCISION HIDRADENITIS BILATERAL AXILLARY;  Surgeon: Armandina Gemma, MD;  Location: [place];  Service: General;  Laterality: Bilateral;   Family History  Problem Relation Age of Onset  . Graves' disease Mother    History  Substance Use Topics  . Smoking status: Current Every Day Smoker -- 0.50 packs/day for 8 years    Types: Cigarettes  . Smokeless tobacco: Never Used  . Alcohol Use: No   OB History    Gravida Para Term Preterm AB TAB SAB Ectopic Multiple Living   0 0 0 0 0 0 0 0       Review of Systems  Constitutional: Negative for fever and chills.  HENT: Negative.   Respiratory: Negative.  Negative for shortness of breath.   Cardiovascular: Negative.  Negative for chest pain.  Gastrointestinal: Negative.   Musculoskeletal: Negative.  Negative for myalgias.  Skin: Negative.        See HPI.  Neurological: Negative.       Allergies  Fentanyl; Penicillins; Triamterene; Naproxen; and Ultram  Home Medications   Prior to  Admission medications   Medication Sig Start Date End Date Taking? Authorizing Provider  albuterol (PROVENTIL HFA;VENTOLIN HFA) 108 (90 BASE) MCG/ACT inhaler Inhale 2 puffs into the lungs every 6 (six) hours as needed for wheezing or shortness of breath. 01/01/14  Yes Ankit Kathrynn Humble, MD  albuterol (PROVENTIL) (2.5 MG/3ML) 0.083% nebulizer solution Take 2.5 mg by nebulization every 8 (eight) hours as needed. Shortness of breath   Yes Historical Provider, MD  ALPRAZolam (XANAX) 1 MG tablet Take 1 mg by mouth 3 (three) times daily.    Yes Historical Provider, MD  atenolol (TENORMIN) 50 MG tablet Take 50 mg by mouth every evening.  01/20/14  Yes Historical Provider,  MD  diltiazem (DILACOR XR) 120 MG 24 hr capsule Take 120 mg by mouth daily.   Yes Historical Provider, MD  doxycycline (VIBRAMYCIN) 100 MG capsule Take 1 capsule (100 mg total) by mouth 2 (two) times daily. 09/29/14  Yes Nicole Pisciotta, PA-C  haloperidol (HALDOL) 0.5 MG tablet Take 0.5 mg by mouth every evening.    Yes Historical Provider, MD  ibuprofen (ADVIL,MOTRIN) 200 MG tablet Take 200-400 mg by mouth every 6 (six) hours as needed for moderate pain.    Yes Historical Provider, MD  lansoprazole (PREVACID) 30 MG capsule Take 30 mg by mouth daily at 12 noon.   Yes Historical Provider, MD  Liraglutide (VICTOZA) 18 MG/3ML SOPN Inject 1.8 mg into the skin daily.    Yes Historical Provider, MD  metFORMIN (GLUCOPHAGE) 1000 MG tablet Take 1,000 mg by mouth daily with breakfast.   Yes Historical Provider, MD  mirtazapine (REMERON) 30 MG tablet Take 30 mg by mouth at bedtime.   Yes Historical Provider, MD  NON FORMULARY cpap   Yes Historical Provider, MD  fenofibrate (TRICOR) 145 MG tablet Take 145 mg by mouth every evening.     Historical Provider, MD  HYDROcodone-acetaminophen (NORCO/VICODIN) 5-325 MG per tablet Take 1 tablet by mouth every 6 (six) hours as needed for moderate pain. Patient not taking: Reported on 09/29/2014 09/08/14   Junius Creamer, NP  HYDROcodone-acetaminophen (NORCO/VICODIN) 5-325 MG per tablet Take 1-2 tablets by mouth every 4 (four) hours as needed for moderate pain. Patient not taking: Reported on 09/29/2014 09/28/14   Armandina Gemma, MD  oxyCODONE-acetaminophen (PERCOCET/ROXICET) 5-325 MG per tablet 1 to 2 tabs PO q6hrs  PRN for pain Patient not taking: Reported on 10/02/2014 09/29/14   Elmyra Ricks Pisciotta, PA-C  triamcinolone cream (KENALOG) 0.1 % Apply 1 application topically 2 (two) times daily. Patient not taking: Reported on 08/30/2014 08/21/14   Gregor Hams, MD   BP 135/67 mmHg  Pulse 78  Temp(Src) 97.4 F (36.3 C) (Rectal)  Resp 18  SpO2 94%  LMP 09/10/2014 Physical Exam   Constitutional: She is oriented to person, place, and time. She appears well-developed and well-nourished.  Neck: Normal range of motion.  Pulmonary/Chest: Effort normal.  Neurological: She is alert and oriented to person, place, and time.  Skin: Skin is warm and dry.  Wide excisional wounds bilateral axilla that have no intra-wound purulence or redness surrounding the excision. No swelling.     ED Course  Procedures (including critical care time) Labs Review Labs Reviewed  CBG MONITORING, ED    Imaging Review No results found.   EKG Interpretation None      MDM   Final diagnoses:  None    1. Wound check  The patient reports her pain is improved with IV Dilaudid. VSS. CBG slightly elevated but no concerning  hyperglycemia. Surgical wounds do not appear infected. She is compliant with her antibiotics and is not complaining of vaginal itching. Will provide Diflucan for when abx therapy is complete. Feel she is stable for discharge home.    Charlann Lange, PA-C 10/02/14 0422  Ripley Fraise, MD 10/02/14 [phone]

## 2014-10-02 NOTE — Discharge Instructions (Signed)
Pain Relief Preoperatively and Postoperatively °Being a good patient does not mean being a silent one. If you have questions, problems, or concerns about the pain you may feel after surgery, let your caregiver know. Patients have the right to assessment and management of pain. The treatment of pain after surgery is important to speed up recovery and return to normal activities. Severe pain after surgery, and the fear or anxiety associated with that pain, may cause extreme discomfort that: °· Prevents sleep. °· Decreases the ability to breathe deeply and cough. This can cause pneumonia or other upper airway infections. °· Causes your heart to beat faster and your blood pressure to be higher. °· Increases the risk for constipation and bloating. °· Decreases the ability of wounds to heal. °· May result in depression, increased anxiety, and feelings of helplessness. °Relief of pain before surgery is also important because it will lessen the pain after surgery. Patients who receive both pain relief before and after surgery experience greater pain relief than those who only receive pain relief after surgery. Let your caregiver know if you are having uncontrolled pain. This is very important. Pain after surgery is more difficult to manage if it is permitted to become severe, so prompt and adequate treatment of acute pain is necessary. °PAIN CONTROL METHODS °Your caregivers follow policies and procedures about the management of patient pain. These guidelines should be explained to you before surgery. Plans for pain control after surgery must be mutually decided upon and instituted with your full understanding and agreement. Do not be afraid to ask questions regarding the care you are receiving. There are many different ways your caregivers will attempt to control your pain, including the following methods. °As needed pain control °· You may be given pain medicine either through your intravenous (IV) tube, or as a pill or  liquid you can swallow. You will need to let your caregiver know when you are having pain. Then, your caregiver will give you the pain medicine ordered for you. °· Your pain medicine may make you constipated. If constipation occurs, drink more liquids if you can. Your caregiver may have you take a mild laxative. °IV patient-controlled analgesia pump (PCA pump) °· You can get your pain medicine through the IV tube which goes into your vein. You are able to control the amount of pain medicine that you get. The pain medicine flows in through an IV tube and is controlled by a pump. This pump gives you a set amount of pain medicine when you push the button hooked up to it. Nobody should push this button but you or someone specifically assigned by you to do so. It is set up to keep you from accidentally giving yourself too much pain medicine. You will be able to start using your pain pump in the recovery room after your surgery. This method can be helpful for most types of surgery. °· If you are still having too much pain, tell your caregiver. Also, tell your caregiver if you are feeling too sleepy or nauseous. °Continuous epidural pain control °· A thin, soft tube (catheter) is put into your back. Pain medicine flows through the catheter to lessen pain in the part of your body where the surgery is done. Continuous epidural pain control may work best for you if you are having surgery on your chest, abdomen, hip area, or legs. The epidural catheter is usually put into your back just before surgery. The catheter is left in until you can eat and take medicine by mouth. In most cases,   this may take 2 to 3 days. °· Giving pain medicine through the epidural catheter may help you heal faster because: °¨ Your bowel gets back to normal faster. °¨ You can get back to eating sooner. °¨ You can be up and walking sooner. °Medicine that numbs the area (local anesthetic) °· You may receive an injection of pain medicine near where the  pain is (local infiltration). °· You may receive an injection of pain medicine near the nerve that controls the sensation to a specific part of the body (peripheral nerve block). °· Medicine may be put in the spine to block pain (spinal block). °Opioids °· Moderate to moderately severe acute pain after surgery may respond to opioids. Opioids are narcotic pain medicine. Opioids are often combined with non-narcotic medicines to improve pain relief, diminish the risk of side effects, and reduce the chance of addiction. °· If you follow your caregiver's directions about taking opioids and you do not have a history of substance abuse, your risk of becoming addicted is exceptionally small. Opioids are given for short periods of time in careful doses to prevent addiction. °Other methods of pain control include: °· Steroids. °· Physical therapy. °· Heat and cold therapy. °· Compression, such as wrapping an elastic bandage around the area of pain. °· Massage. °These various ways of controlling pain may be used together. Combining different methods of pain control is called multimodal analgesia. Using this approach has many benefits, including being able to eat, move around, and leave the hospital sooner. °Document Released: 05/19/2002 Document Revised: 05/21/2011 Document Reviewed: 05/23/2010 °[employer]® Patient Information ©2015 [employer]. This information is not intended to replace advice given to you by your health care provider. Make sure you discuss any questions you have with your health care provider. ° °

## 2014-10-02 NOTE — ED Notes (Signed)
Pt called to be roomed but no response from the lobby.  Nurse checked outside but no patient was seen there.  Will call again when new room is available.

## 2014-10-02 NOTE — ED Notes (Signed)
Documented in error dressing change.

## 2014-10-03 ENCOUNTER — Emergency Department
Admission: EM | Admit: 2014-10-03 | Discharge: 2014-10-03 | Disposition: A | Discharge status: Home / Self Care | Payer: Medicare Other | Attending: Emergency Medicine | Admitting: Emergency Medicine

## 2014-10-03 ENCOUNTER — Encounter: Payer: Self-pay | Admitting: Emergency Medicine

## 2014-10-03 ENCOUNTER — Emergency Department: Payer: Medicare Other

## 2014-10-03 DIAGNOSIS — S299XXA Unspecified injury of thorax, initial encounter: Secondary | ICD-10-CM | POA: Diagnosis not present

## 2014-10-03 DIAGNOSIS — Z79899 Other long term (current) drug therapy: Secondary | ICD-10-CM | POA: Insufficient documentation

## 2014-10-03 DIAGNOSIS — R0789 Other chest pain: Secondary | ICD-10-CM

## 2014-10-03 DIAGNOSIS — Z720 Tobacco use: Secondary | ICD-10-CM | POA: Insufficient documentation

## 2014-10-03 DIAGNOSIS — Z880 Allergy status to penicillin: Secondary | ICD-10-CM | POA: Insufficient documentation

## 2014-10-03 DIAGNOSIS — S161XXA Strain of muscle, fascia and tendon at neck level, initial encounter: Secondary | ICD-10-CM | POA: Diagnosis not present

## 2014-10-03 DIAGNOSIS — E119 Type 2 diabetes mellitus without complications: Secondary | ICD-10-CM | POA: Insufficient documentation

## 2014-10-03 DIAGNOSIS — Y998 Other external cause status: Secondary | ICD-10-CM | POA: Insufficient documentation

## 2014-10-03 DIAGNOSIS — Y9389 Activity, other specified: Secondary | ICD-10-CM | POA: Diagnosis not present

## 2014-10-03 DIAGNOSIS — S199XXA Unspecified injury of neck, initial encounter: Secondary | ICD-10-CM | POA: Diagnosis present

## 2014-10-03 DIAGNOSIS — Y92410 Unspecified street and highway as the place of occurrence of the external cause: Secondary | ICD-10-CM | POA: Diagnosis not present

## 2014-10-03 DIAGNOSIS — S139XXA Sprain of joints and ligaments of unspecified parts of neck, initial encounter: Secondary | ICD-10-CM

## 2014-10-03 MED ORDER — MORPHINE SULFATE 4 MG/ML IJ SOLN
4.0000 mg | Freq: Once | INTRAMUSCULAR | Status: AC
  Administered 2014-10-03: 4 mg via INTRAMUSCULAR
  Filled 2014-10-03: qty 1
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
## 2014-10-04 ENCOUNTER — Emergency Department (HOSPITAL_COMMUNITY)
Admission: EM | Admit: 2014-10-04 | Discharge: 2014-10-04 | Disposition: A | Discharge status: Home / Self Care | Payer: Medicare Other | Attending: Emergency Medicine | Admitting: Emergency Medicine

## 2014-10-04 ENCOUNTER — Encounter (HOSPITAL_COMMUNITY): Payer: Self-pay

## 2014-10-04 DIAGNOSIS — F319 Bipolar disorder, unspecified: Secondary | ICD-10-CM | POA: Diagnosis not present

## 2014-10-04 DIAGNOSIS — Z8619 Personal history of other infectious and parasitic diseases: Secondary | ICD-10-CM | POA: Insufficient documentation

## 2014-10-04 DIAGNOSIS — E119 Type 2 diabetes mellitus without complications: Secondary | ICD-10-CM | POA: Diagnosis not present

## 2014-10-04 DIAGNOSIS — M542 Cervicalgia: Secondary | ICD-10-CM | POA: Diagnosis not present

## 2014-10-04 DIAGNOSIS — Z4801 Encounter for change or removal of surgical wound dressing: Secondary | ICD-10-CM | POA: Diagnosis present

## 2014-10-04 DIAGNOSIS — Z720 Tobacco use: Secondary | ICD-10-CM | POA: Diagnosis not present

## 2014-10-04 DIAGNOSIS — Z7952 Long term (current) use of systemic steroids: Secondary | ICD-10-CM | POA: Insufficient documentation

## 2014-10-04 DIAGNOSIS — K219 Gastro-esophageal reflux disease without esophagitis: Secondary | ICD-10-CM | POA: Diagnosis not present

## 2014-10-04 DIAGNOSIS — Z8669 Personal history of other diseases of the nervous system and sense organs: Secondary | ICD-10-CM | POA: Insufficient documentation

## 2014-10-04 DIAGNOSIS — G8918 Other acute postprocedural pain: Secondary | ICD-10-CM | POA: Diagnosis not present

## 2014-10-04 DIAGNOSIS — I10 Essential (primary) hypertension: Secondary | ICD-10-CM | POA: Insufficient documentation

## 2014-10-04 DIAGNOSIS — Z872 Personal history of diseases of the skin and subcutaneous tissue: Secondary | ICD-10-CM | POA: Insufficient documentation

## 2014-10-04 DIAGNOSIS — M170 Bilateral primary osteoarthritis of knee: Secondary | ICD-10-CM | POA: Diagnosis not present

## 2014-10-04 DIAGNOSIS — E669 Obesity, unspecified: Secondary | ICD-10-CM | POA: Insufficient documentation

## 2014-10-04 DIAGNOSIS — F209 Schizophrenia, unspecified: Secondary | ICD-10-CM | POA: Insufficient documentation

## 2014-10-04 DIAGNOSIS — J45909 Unspecified asthma, uncomplicated: Secondary | ICD-10-CM | POA: Diagnosis not present

## 2014-10-04 DIAGNOSIS — F419 Anxiety disorder, unspecified: Secondary | ICD-10-CM | POA: Diagnosis not present

## 2014-10-04 DIAGNOSIS — Z8742 Personal history of other diseases of the female genital tract: Secondary | ICD-10-CM | POA: Diagnosis not present

## 2014-10-04 DIAGNOSIS — Z79899 Other long term (current) drug therapy: Secondary | ICD-10-CM | POA: Diagnosis not present

## 2014-10-04 DIAGNOSIS — Z792 Long term (current) use of antibiotics: Secondary | ICD-10-CM | POA: Diagnosis not present

## 2014-10-04 DIAGNOSIS — Z880 Allergy status to penicillin: Secondary | ICD-10-CM | POA: Insufficient documentation

## 2014-10-04 LAB — CULTURE, BLOOD (ROUTINE X 2)
CULTURE: NO GROWTH
Culture: NO GROWTH

## 2014-10-04 MED ORDER — IBUPROFEN 600 MG PO TABS: 600.0000 mg | ORAL_TABLET | Freq: Three times a day (TID) | ORAL | Status: DC | PRN

## 2014-10-04 MED ORDER — OXYCODONE-ACETAMINOPHEN 5-325 MG PO TABS
2.0000 | ORAL_TABLET | Freq: Once | ORAL | Status: AC
  Administered 2014-10-04: 2 via ORAL
  Filled 2014-10-04: qty 2

## 2014-10-04 NOTE — ED Notes (Signed)
Spoke with pt regarding dressing changes. Pt request 10 more minutes more before changing.

## 2014-10-04 NOTE — ED Notes (Signed)
Was upset that prescription pain medication, narcotic would not be given. Pt left ama

## 2014-10-04 NOTE — ED Notes (Signed)
Pt awaiting surgical f/u consult
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
## 2014-10-07 NOTE — Addendum Note (Signed)
Addendum  created 10/07/14 0715 by Baxter Flattery, CRNA   Modules edited: Anesthesia Events, Narrator   Narrator:  Narrator: Event Log Edited

## 2014-10-17 DIAGNOSIS — B372 Candidiasis of skin and nail: Secondary | ICD-10-CM | POA: Insufficient documentation

## 2014-10-17 DIAGNOSIS — Z720 Tobacco use: Secondary | ICD-10-CM | POA: Insufficient documentation

## 2014-10-17 DIAGNOSIS — Z880 Allergy status to penicillin: Secondary | ICD-10-CM | POA: Diagnosis not present

## 2014-10-17 DIAGNOSIS — T8189XA Other complications of procedures, not elsewhere classified, initial encounter: Secondary | ICD-10-CM | POA: Diagnosis not present

## 2014-10-17 DIAGNOSIS — Z79899 Other long term (current) drug therapy: Secondary | ICD-10-CM | POA: Insufficient documentation

## 2014-10-17 DIAGNOSIS — I10 Essential (primary) hypertension: Secondary | ICD-10-CM | POA: Insufficient documentation

## 2014-10-17 DIAGNOSIS — Y838 Other surgical procedures as the cause of abnormal reaction of the patient, or of later complication, without mention of misadventure at the time of the procedure: Secondary | ICD-10-CM | POA: Diagnosis not present

## 2014-10-17 DIAGNOSIS — E119 Type 2 diabetes mellitus without complications: Secondary | ICD-10-CM | POA: Insufficient documentation

## 2014-10-17 LAB — CBC WITH DIFFERENTIAL/PLATELET
BASOS PCT: 1 %
Basophils Absolute: 0.1 10*3/uL (ref 0–0.1)
Eosinophils Absolute: 0.3 10*3/uL (ref 0–0.7)
Eosinophils Relative: 4 %
HCT: 41.4 % (ref 35.0–47.0)
Hemoglobin: 14.7 g/dL (ref 12.0–16.0)
LYMPHS PCT: 44 %
Lymphs Abs: 3.7 10*3/uL — ABNORMAL HIGH (ref 1.0–3.6)
MCH: 30.7 pg (ref 26.0–34.0)
MCHC: 35.4 g/dL (ref 32.0–36.0)
MCV: 86.7 fL (ref 80.0–100.0)
MONO ABS: 0.3 10*3/uL (ref 0.2–0.9)
MONOS PCT: 4 %
NEUTROS ABS: 4 10*3/uL (ref 1.4–6.5)
NEUTROS PCT: 47 %
Platelets: 300 10*3/uL (ref 150–440)
RBC: 4.77 MIL/uL (ref 3.80–5.20)
RDW: 12.9 % (ref 11.5–14.5)
WBC: 8.4 10*3/uL (ref 3.6–11.0)

## 2014-10-17 NOTE — ED Notes (Signed)
Patient reports had surgery in July to remove sweat glands from bilateral axillary region.  Reports green drainage from both incision for a couple days.  Also reports running fever and vomiting at home.

## 2014-10-18 ENCOUNTER — Emergency Department
Admission: EM | Admit: 2014-10-18 | Discharge: 2014-10-18 | Disposition: A | Discharge status: Home / Self Care | Payer: Medicare Other | Attending: Emergency Medicine | Admitting: Emergency Medicine

## 2014-10-18 DIAGNOSIS — Z9889 Other specified postprocedural states: Secondary | ICD-10-CM

## 2014-10-18 DIAGNOSIS — B372 Candidiasis of skin and nail: Secondary | ICD-10-CM

## 2014-10-18 DIAGNOSIS — T8189XA Other complications of procedures, not elsewhere classified, initial encounter: Secondary | ICD-10-CM | POA: Diagnosis not present

## 2014-10-18 LAB — COMPREHENSIVE METABOLIC PANEL
ALK PHOS: 51 U/L (ref 38–126)
ALT: 40 U/L (ref 14–54)
AST: 32 U/L (ref 15–41)
Albumin: 3.8 g/dL (ref 3.5–5.0)
BILIRUBIN TOTAL: 0.3 mg/dL (ref 0.3–1.2)
BUN: 14 mg/dL (ref 6–20)
CO2: 21 mmol/L — ABNORMAL LOW (ref 22–32)
CREATININE: 0.54 mg/dL (ref 0.44–1.00)
Calcium: 9.1 mg/dL (ref 8.9–10.3)
Chloride: 106 mmol/L (ref 101–111)
GLUCOSE: 216 mg/dL — AB (ref 65–99)
Potassium: 3.8 mmol/L (ref 3.5–5.1)

## 2014-10-18 MED ORDER — NYSTATIN POWD: 1.0000 "application " | Freq: Two times a day (BID) | Status: DC

## 2014-10-18 MED ORDER — FLUCONAZOLE 100 MG PO TABS
200.0000 mg | ORAL_TABLET | Freq: Once | ORAL | Status: AC
  Administered 2014-10-18: 200 mg via ORAL
  Filled 2014-10-18 (×2): qty 2

## 2014-10-18 MED ORDER — FLUCONAZOLE 100 MG PO TABS: 100.0000 mg | ORAL_TABLET | Freq: Every day | ORAL | Status: AC

## 2014-10-18 NOTE — ED Notes (Addendum)
Pharmacy contacted due to no Diflucan in pyxis. Patient will be discharged upon administration.

## 2014-10-18 NOTE — ED Provider Notes (Signed)
[facility] Emergency Department Provider Note ___________________________________________  Time seen: Approximately  12:32 AM  I have reviewed the triage vital signs and the nursing notes.   HISTORY  Chief Complaint Drainage from Incision  HPI Rebecca Moyer is a 29 y.o. female who is complaining that she had surgery to remove the glands under her bilateral axilla was for multiple abscesses 01 July 18 and she has had an episode where they were increased drainage in the left side had gotten infected about 10 days ago. Patient was placed on Bactrim and states that she doesn't have quite the same amount of drainage and the actual surgical wounds look clean her, but she is having a foul odor and a red rash surrounding her bilateral wounds. Patient had the procedure done at [facility] and does not have an appointment to see the surgeon again he'll the end of the month. Patient denies any significant fever, chills, or increased pain to the area. Patient states it just had a foul smell and the red rash should develop. Patient is also diabetic therefore she was very concerned. Patient denies any significant pain at this time. Patient has been drawn wet-to-dry dressing changes for the wound to the heal by intention and the actual wound itself looks fairly clean with only minimal drainage.   Past Medical History  Diagnosis Date  . Asthma 06/07/11  . H/O varicella   . H/O amenorrhea 01/27/04  . Irregular periods/menstrual cycles 05/22/04  . Depression 06/07/11  . Hypertension 06/07/11  . PCOS (polycystic ovarian syndrome) 06/07/11  . Obesity 06/07/11  . Smoker 06/07/11  . Diabetes mellitus without complication   . Arthritis     knees  . GERD (gastroesophageal reflux disease)   . Difficulty swallowing solids     due to GERD and slow motility of esophagus, per pt.  . Anxiety   . Non-insulin dependent type 2 diabetes mellitus   . Bipolar disorder   . Schizophrenia   . Depression    . Hypertension     states under control with med., has been on med. x 6 yr.  . Asthma     prn inhaler  . Multiple personality disorder   . Hidradenitis 09/2014    bilateral axilla, left buttock, left groin  . Nasal congestion 09/27/2014  . Tachycardia     no cardiologist, per pt.  . Sleep apnea     does not use CPAP every night, per pt.    Patient Active Problem List   Diagnosis Date Noted  . Hidradenitis 09/28/2014  . 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 DEPRESSIVE DISORDER, NOS 05/09/2006  . ATTENTION DEFICIT, W/HYPERACTIVITY 05/09/2006    Past Surgical History  Procedure Laterality Date  . Tonsillectomy  06/07/11  . Wisdom tooth extraction  06/07/11  . Adnoids  06/07/11  . Tonsillectomy and adenoidectomy  06/26/2000  . Wisdom tooth extraction    . Hydradenitis excision Bilateral 09/28/2014    Procedure: EXCISION HIDRADENITIS BILATERAL AXILLARY;  Surgeon: Armandina Gemma, MD;  Location: [place];  Service: General;  Laterality: Bilateral;    Current Outpatient Rx  Name  Route  Sig  Dispense  Refill  . albuterol (PROVENTIL HFA;VENTOLIN HFA) 108 (90 BASE) MCG/ACT inhaler   Inhalation   Inhale 2 puffs into the lungs every 6 (six) hours as needed for wheezing or shortness of breath.   1 Inhaler   2   . albuterol (PROVENTIL) (2.5 MG/3ML) 0.083% nebulizer solution  Nebulization   Take 2.5 mg by nebulization every 8 (eight) hours as needed. Shortness of breath         . ALPRAZolam (XANAX) 1 MG tablet   Oral   Take 1 mg by mouth 3 (three) times daily.          Marland Kitchen atenolol (TENORMIN) 50 MG tablet   Oral   Take 50 mg by mouth every evening.       5   . diltiazem (DILACOR XR) 120 MG 24 hr capsule   Oral   Take 120 mg by mouth daily.         Marland Kitchen doxycycline (VIBRAMYCIN) 100 MG capsule   Oral   Take 1 capsule (100 mg total) by mouth 2 (two) times daily. Patient not taking: Reported on 10/18/2014   14 capsule   0   . fenofibrate (TRICOR) 145 MG  tablet   Oral   Take 145 mg by mouth every evening.          . fluconazole (DIFLUCAN) 100 MG tablet   Oral   Take 1 tablet (100 mg total) by mouth daily.   7 tablet   0   . haloperidol (HALDOL) 0.5 MG tablet   Oral   Take 0.5 mg by mouth every evening.          Marland Kitchen HYDROcodone-acetaminophen (NORCO/VICODIN) 5-325 MG per tablet   Oral   Take 1 tablet by mouth every 6 (six) hours as needed for moderate pain. Patient not taking: Reported on 09/29/2014   10 tablet   0   . HYDROcodone-acetaminophen (NORCO/VICODIN) 5-325 MG per tablet   Oral   Take 1-2 tablets by mouth every 4 (four) hours as needed for moderate pain. Patient not taking: Reported on 09/29/2014   20 tablet   0   . ibuprofen (ADVIL,MOTRIN) 600 MG tablet   Oral   Take 1 tablet (600 mg total) by mouth every 8 (eight) hours as needed.   15 tablet   0   . lansoprazole (PREVACID) 30 MG capsule   Oral   Take 30 mg by mouth daily at 12 noon.         . Liraglutide (VICTOZA) 18 MG/3ML SOPN   Subcutaneous   Inject 1.8 mg into the skin daily.          . metFORMIN (GLUCOPHAGE) 1000 MG tablet   Oral   Take 1,000 mg by mouth daily with breakfast.         . mirtazapine (REMERON) 30 MG tablet   Oral   Take 30 mg by mouth at bedtime.         . NON FORMULARY      cpap         . Nystatin POWD   Does not apply   1 application by Does not apply route 2 (two) times daily.   1 Bottle   2   . oxyCODONE-acetaminophen (PERCOCET/ROXICET) 5-325 MG per tablet      1 to 2 tabs PO q6hrs  PRN for pain Patient not taking: Reported on 10/02/2014   15 tablet   0   . triamcinolone cream (KENALOG) 0.1 %   Topical   Apply 1 application topically 2 (two) times daily. Patient not taking: Reported on 08/30/2014   453.6 g   0     Allergies Fentanyl; Penicillins; Triamterene; Naproxen; and Ultram  Family History  Problem Relation Age of Onset  . Graves' disease Mother  Social History History  Substance  Use Topics  . Smoking status: Current Every Day Smoker -- 0.50 packs/day for 8 years    Types: Cigarettes  . Smokeless tobacco: Never Used  . Alcohol Use: No    Review of Systems Constitutional: No fever/chills Eyes: No visual changes. ENT: No sore throat. Cardiovascular: Denies chest pain. Respiratory: Denies shortness of breath. Gastrointestinal: No abdominal pain.  No nausea, no vomiting.  No diarrhea.  No constipation. Genitourinary: Negative for dysuria. Musculoskeletal: Negative for back pain. Skin: Redness and rash type lesions surrounding her bilateral surgical sites under her bilateral axilla was. There is still minimal drainage from the surgical wounds from where she had all her glands removed bilaterally. Patient states that the rash has a foul smell.  Neurological: Negative for headaches, focal weakness or numbness. 10-point ROS otherwise negative.  ____________________________________________   PHYSICAL EXAM:  VITAL SIGNS: ED Triage Vitals  Enc Vitals Group     BP 10/17/14 2324 128/74 mmHg     Pulse Rate 10/17/14 2324 75     Resp 10/17/14 2324 18     Temp 10/17/14 2324 98.1 F (36.7 C)     Temp Source 10/17/14 2324 Oral     SpO2 10/17/14 2324 98 %     Weight 10/17/14 2324 260 lb (117.935 kg)     Height 10/17/14 2324 5\' 9"  (1.753 m)     Head Cir --      Peak Flow --      Pain Score 10/18/14 0035 0     Pain Loc --      Pain Edu? --      Excl. in [place]? --     Constitutional: Alert and oriented. Well appearing and in no acute distress. Eyes: Conjunctivae are normal. PERRL. EOMI. Head: Atraumatic. Nose: No congestion/rhinnorhea. Mouth/Throat: Mucous membranes are moist.  Oropharynx non-erythematous. Neck: No stridor.   Cardiovascular: Normal rate, regular rhythm. Grossly normal heart sounds.  Good peripheral circulation. Respiratory: Normal respiratory effort.  No retractions. Lungs CTAB. Gastrointestinal: Soft and nontender. No distention. No abdominal  bruits. No CVA tenderness. Musculoskeletal: No lower extremity tenderness nor edema.  No joint effusions. Neurologic:  Normal speech and language. No gross focal neurologic deficits are appreciated. No gait instability. Skin:  Skin is warm, dry and intact. Patient's bilateral wounds under her bilateral axilla have minimal drainage but actually are healing very well. Patient has a yeast type rash surrounding both wounds to her entire axillas. I told the patient that that probably was secondary to the fact that she was having wet to dry dressings, as well as she has recently been on antibiotics, as well as she is a diabetic. Psychiatric: Mood and affect are normal. Speech and behavior are normal.  ____________________________________________   LABS (all labs ordered are listed, but only abnormal results are displayed)  Labs Reviewed  COMPREHENSIVE METABOLIC PANEL - Abnormal; Notable for the following:    CO2 21 (*)    Glucose, Bld 216 (*)    All other components within normal limits  CBC WITH DIFFERENTIAL/PLATELET - Abnormal; Notable for the following:    Lymphs Abs 3.7 (*)    All other components within normal limits   ____________________________________________  EKG  None ____________________________________________  RADIOLOGY  None ____________________________________________   PROCEDURES  Procedure(s) performed: None  Critical Care performed: No  ____________________________________________   INITIAL IMPRESSION / ASSESSMENT AND PLAN / ED COURSE  Pertinent labs & imaging results that were available during my care of the  patient were reviewed by me and considered in my medical decision making (see chart for details).  ----------------------------------------- 1:21 AM on 10/18/2014 -----------------------------------------  I discussed this patient with Dr. Adonis Huguenin the general surgeon on call for this evening who agreed that if I felt the rash was a yeast type  infection that we could give her Diflucan along with Diflucan powder. Patient was told to follow-up this week with her general surgeon. Patient's white blood cell count was normal and her sugar was only mildly elevated this evening. ____________________________________________   FINAL CLINICAL IMPRESSION(S) / ED DIAGNOSES  Final diagnoses:  Candidiasis of skin  Recent major surgery      Ruby Cola, MD 10/18/14 0129

## 2014-10-18 NOTE — ED Notes (Signed)
MD Taylor at bedside.

## 2014-11-09 ENCOUNTER — Ambulatory Visit (HOSPITAL_BASED_OUTPATIENT_CLINIC_OR_DEPARTMENT_OTHER): Payer: Medicare Other

## 2014-12-17 ENCOUNTER — Emergency Department (HOSPITAL_COMMUNITY)
Admission: EM | Admit: 2014-12-17 | Discharge: 2014-12-17 | Disposition: A | Discharge status: Home / Self Care | Payer: Medicare Other | Attending: Emergency Medicine | Admitting: Emergency Medicine

## 2014-12-17 ENCOUNTER — Emergency Department (HOSPITAL_COMMUNITY): Payer: Medicare Other

## 2014-12-17 ENCOUNTER — Encounter (HOSPITAL_COMMUNITY): Payer: Self-pay

## 2014-12-17 DIAGNOSIS — E119 Type 2 diabetes mellitus without complications: Secondary | ICD-10-CM | POA: Insufficient documentation

## 2014-12-17 DIAGNOSIS — Z872 Personal history of diseases of the skin and subcutaneous tissue: Secondary | ICD-10-CM | POA: Insufficient documentation

## 2014-12-17 DIAGNOSIS — I10 Essential (primary) hypertension: Secondary | ICD-10-CM | POA: Insufficient documentation

## 2014-12-17 DIAGNOSIS — Z880 Allergy status to penicillin: Secondary | ICD-10-CM | POA: Insufficient documentation

## 2014-12-17 DIAGNOSIS — R1012 Left upper quadrant pain: Secondary | ICD-10-CM | POA: Insufficient documentation

## 2014-12-17 DIAGNOSIS — E282 Polycystic ovarian syndrome: Secondary | ICD-10-CM | POA: Insufficient documentation

## 2014-12-17 DIAGNOSIS — R197 Diarrhea, unspecified: Secondary | ICD-10-CM | POA: Diagnosis not present

## 2014-12-17 DIAGNOSIS — Z8742 Personal history of other diseases of the female genital tract: Secondary | ICD-10-CM | POA: Diagnosis not present

## 2014-12-17 DIAGNOSIS — F419 Anxiety disorder, unspecified: Secondary | ICD-10-CM | POA: Diagnosis not present

## 2014-12-17 DIAGNOSIS — Z8619 Personal history of other infectious and parasitic diseases: Secondary | ICD-10-CM | POA: Insufficient documentation

## 2014-12-17 DIAGNOSIS — J45909 Unspecified asthma, uncomplicated: Secondary | ICD-10-CM | POA: Diagnosis not present

## 2014-12-17 DIAGNOSIS — Z720 Tobacco use: Secondary | ICD-10-CM | POA: Insufficient documentation

## 2014-12-17 DIAGNOSIS — E669 Obesity, unspecified: Secondary | ICD-10-CM | POA: Diagnosis not present

## 2014-12-17 DIAGNOSIS — R109 Unspecified abdominal pain: Secondary | ICD-10-CM

## 2014-12-17 DIAGNOSIS — F4481 Dissociative identity disorder: Secondary | ICD-10-CM | POA: Insufficient documentation

## 2014-12-17 DIAGNOSIS — F319 Bipolar disorder, unspecified: Secondary | ICD-10-CM | POA: Diagnosis not present

## 2014-12-17 DIAGNOSIS — Z79899 Other long term (current) drug therapy: Secondary | ICD-10-CM | POA: Diagnosis not present

## 2014-12-17 DIAGNOSIS — R112 Nausea with vomiting, unspecified: Secondary | ICD-10-CM | POA: Diagnosis not present

## 2014-12-17 DIAGNOSIS — Z7984 Long term (current) use of oral hypoglycemic drugs: Secondary | ICD-10-CM | POA: Insufficient documentation

## 2014-12-17 DIAGNOSIS — R1032 Left lower quadrant pain: Secondary | ICD-10-CM | POA: Diagnosis not present

## 2014-12-17 DIAGNOSIS — M1990 Unspecified osteoarthritis, unspecified site: Secondary | ICD-10-CM | POA: Insufficient documentation

## 2014-12-17 DIAGNOSIS — M79621 Pain in right upper arm: Secondary | ICD-10-CM | POA: Diagnosis not present

## 2014-12-17 DIAGNOSIS — K219 Gastro-esophageal reflux disease without esophagitis: Secondary | ICD-10-CM | POA: Insufficient documentation

## 2014-12-17 LAB — URINE MICROSCOPIC-ADD ON

## 2014-12-17 LAB — CBC
HEMATOCRIT: 42.4 % (ref 36.0–46.0)
HEMOGLOBIN: 15.1 g/dL — AB (ref 12.0–15.0)
MCH: 30.6 pg (ref 26.0–34.0)
MCHC: 35.6 g/dL (ref 30.0–36.0)
MCV: 85.8 fL (ref 78.0–100.0)
Platelets: 268 10*3/uL (ref 150–400)
RBC: 4.94 MIL/uL (ref 3.87–5.11)
RDW: 12.8 % (ref 11.5–15.5)
WBC: 8.6 10*3/uL (ref 4.0–10.5)

## 2014-12-17 LAB — URINALYSIS, ROUTINE W REFLEX MICROSCOPIC
Bilirubin Urine: NEGATIVE
Glucose, UA: >1000 mg/dL — AB
Hgb urine dipstick: NEGATIVE
Ketones, ur: NEGATIVE mg/dL
LEUKOCYTES UA: NEGATIVE
NITRITE: NEGATIVE
Protein, ur: NEGATIVE mg/dL
SPECIFIC GRAVITY, URINE: 1.031 — AB (ref 1.005–1.030)
Urobilinogen, UA: 0.2 mg/dL (ref 0.0–1.0)
pH: 6 (ref 5.0–8.0)

## 2014-12-17 LAB — COMPREHENSIVE METABOLIC PANEL
ALBUMIN: 3.6 g/dL (ref 3.5–5.0)
ALT: 47 U/L (ref 14–54)
ANION GAP: 16 — AB (ref 5–15)
AST: 24 U/L (ref 15–41)
Alkaline Phosphatase: 50 U/L (ref 38–126)
BILIRUBIN TOTAL: 0.3 mg/dL (ref 0.3–1.2)
BUN: 10 mg/dL (ref 6–20)
CALCIUM: 9.2 mg/dL (ref 8.9–10.3)
CO2: 18 mmol/L — ABNORMAL LOW (ref 22–32)
Chloride: 101 mmol/L (ref 101–111)
Creatinine, Ser: 0.52 mg/dL (ref 0.44–1.00)
GLUCOSE: 268 mg/dL — AB (ref 65–99)
POTASSIUM: 4.2 mmol/L (ref 3.5–5.1)
Sodium: 135 mmol/L (ref 135–145)
Total Protein: 6.1 g/dL — ABNORMAL LOW (ref 6.5–8.1)

## 2014-12-17 LAB — LIPASE, BLOOD: Lipase: 25 U/L (ref 22–51)

## 2014-12-17 LAB — I-STAT CG4 LACTIC ACID, ED: Lactic Acid, Venous: 2.12 mmol/L (ref 0.5–2.0)

## 2014-12-17 LAB — I-STAT BETA HCG BLOOD, ED (MC, WL, AP ONLY)

## 2014-12-17 MED ORDER — ONDANSETRON HCL 4 MG PO TABS: 4.0000 mg | ORAL_TABLET | Freq: Four times a day (QID) | ORAL | Status: DC

## 2014-12-17 MED ORDER — IOHEXOL 300 MG/ML  SOLN
100.0000 mL | Freq: Once | INTRAMUSCULAR | Status: AC | PRN
  Administered 2014-12-17: 100 mL via INTRAVENOUS

## 2014-12-17 MED ORDER — OXYCODONE-ACETAMINOPHEN 5-325 MG PO TABS
1.0000 | ORAL_TABLET | Freq: Once | ORAL | Status: AC
  Administered 2014-12-17: 1 via ORAL
  Filled 2014-12-17: qty 1

## 2014-12-17 MED ORDER — ONDANSETRON HCL 4 MG/2ML IJ SOLN
4.0000 mg | Freq: Once | INTRAMUSCULAR | Status: AC
  Administered 2014-12-17: 4 mg via INTRAVENOUS
  Filled 2014-12-17: qty 2

## 2014-12-17 MED ORDER — SODIUM CHLORIDE 0.9 % IV BOLUS (SEPSIS)
1000.0000 mL | Freq: Once | INTRAVENOUS | Status: AC
  Administered 2014-12-17: 1000 mL via INTRAVENOUS

## 2014-12-17 NOTE — ED Notes (Signed)
Per EMS, pt called ems for pain in right arm pit, pt had surgery there in July to repair and abscess. Pt was seen at [facility] hospital 2 days ago and they said it was infected. Pt states that she has also had some abd discomfort with n/v. Pt vomited once with ems. EMS VS 130/80, HR 79, RR 17, SPO2 94% on RA, CBG 273, Temp 98.3. Pt alert and oriented x 4. NAD. Allergic to fentanyl, pennicillen and ultram

## 2014-12-17 NOTE — Discharge Instructions (Signed)
Abdominal Pain, Adult °Many things can cause abdominal pain. Usually, abdominal pain is not caused by a disease and will improve without treatment. It can often be observed and treated at home. Your health care provider will do a physical exam and possibly order blood tests and X-rays to help determine the seriousness of your pain. However, in many cases, more time must pass before a clear cause of the pain can be found. Before that point, your health care provider may not know if you need more testing or further treatment. °HOME CARE INSTRUCTIONS °Monitor your abdominal pain for any changes. The following actions may help to alleviate any discomfort you are experiencing: °· Only take over-the-counter or prescription medicines as directed by your health care provider. °· Do not take laxatives unless directed to do so by your health care provider. °· Try a clear liquid diet (broth, tea, or water) as directed by your health care provider. Slowly move to a bland diet as tolerated. °SEEK MEDICAL CARE IF: °· You have unexplained abdominal pain. °· You have abdominal pain associated with nausea or diarrhea. °· You have pain when you urinate or have a bowel movement. °· You experience abdominal pain that wakes you in the night. °· You have abdominal pain that is worsened or improved by eating food. °· You have abdominal pain that is worsened with eating fatty foods. °· You have a fever. °SEEK IMMEDIATE MEDICAL CARE IF: °· Your pain does not go away within 2 hours. °· You keep throwing up (vomiting). °· Your pain is felt only in portions of the abdomen, such as the right side or the left lower portion of the abdomen. °· You pass bloody or black tarry stools. °MAKE SURE YOU: °· Understand these instructions. °· Will watch your condition. °· Will get help right away if you are not doing well or get worse. °  °This information is not intended to replace advice given to you by your health care provider. Make sure you discuss  any questions you have with your health care provider. °  °Document Released: 12/06/2004 Document Revised: 11/17/2014 Document Reviewed: 11/05/2012 °Elsevier Interactive Patient Education ©2016 [employer] ° °Nausea and Vomiting °Nausea is a sick feeling that often comes before throwing up (vomiting). Vomiting is a reflex where stomach contents come out of your mouth. Vomiting can cause severe loss of body fluids (dehydration). Children and elderly adults can become dehydrated quickly, especially if they also have diarrhea. Nausea and vomiting are symptoms of a condition or disease. It is important to find the cause of your symptoms. °CAUSES  °· Direct irritation of the stomach lining. This irritation can result from increased acid production (gastroesophageal reflux disease), infection, food poisoning, taking certain medicines (such as nonsteroidal anti-inflammatory drugs), alcohol use, or tobacco use. °· Signals from the brain. These signals could be caused by a headache, heat exposure, an inner ear disturbance, increased pressure in the brain from injury, infection, a tumor, or a concussion, pain, emotional stimulus, or metabolic problems. °· An obstruction in the gastrointestinal tract (bowel obstruction). °· Illnesses such as diabetes, hepatitis, gallbladder problems, appendicitis, kidney problems, cancer, sepsis, atypical symptoms of a heart attack, or eating disorders. °· Medical treatments such as chemotherapy and radiation. °· Receiving medicine that makes you sleep (general anesthetic) during surgery. °DIAGNOSIS °Your caregiver may ask for tests to be done if the problems do not improve after a few days. Tests may also be done if symptoms are severe or if the reason for the   nausea and vomiting is not clear. Tests may include: °· Urine tests. °· Blood tests. °· Stool tests. °· Cultures (to look for evidence of infection). °· X-rays or other imaging studies. °Test results can help your caregiver make  decisions about treatment or the need for additional tests. °TREATMENT °You need to stay well hydrated. Drink frequently but in small amounts. You may wish to drink water, sports drinks, clear broth, or eat frozen ice pops or gelatin dessert to help stay hydrated. When you eat, eating slowly may help prevent nausea. There are also some antinausea medicines that may help prevent nausea. °HOME CARE INSTRUCTIONS  °· Take all medicine as directed by your caregiver. °· If you do not have an appetite, do not force yourself to eat. However, you must continue to drink fluids. °· If you have an appetite, eat a normal diet unless your caregiver tells you differently. °¨ Eat a variety of complex carbohydrates (rice, wheat, potatoes, bread), lean meats, yogurt, fruits, and vegetables. °¨ Avoid high-fat foods because they are more difficult to digest. °· Drink enough water and fluids to keep your urine clear or pale yellow. °· If you are dehydrated, ask your caregiver for specific rehydration instructions. Signs of dehydration may include: °¨ Severe thirst. °¨ Dry lips and mouth. °¨ Dizziness. °¨ Dark urine. °¨ Decreasing urine frequency and amount. °¨ Confusion. °¨ Rapid breathing or pulse. °SEEK IMMEDIATE MEDICAL CARE IF:  °· You have blood or brown flecks (like coffee grounds) in your vomit. °· You have black or bloody stools. °· You have a severe headache or stiff neck. °· You are confused. °· You have severe abdominal pain. °· You have chest pain or trouble breathing. °· You do not urinate at least once every 8 hours. °· You develop cold or clammy skin. °· You continue to vomit for longer than 24 to 48 hours. °· You have a fever. °MAKE SURE YOU:  °· Understand these instructions. °· Will watch your condition. °· Will get help right away if you are not doing well or get worse. °  °This information is not intended to replace advice given to you by your health care provider. Make sure you discuss any questions you have with  your health care provider. °  °Document Released: 02/26/2005 Document Revised: 05/21/2011 Document Reviewed: 07/26/2010 °Elsevier Interactive Patient Education ©2016 [employer]vier Inc. ° °

## 2014-12-17 NOTE — ED Provider Notes (Signed)
CSN: [id number]     Arrival date & time 12/17/14  0610 History   First MD Initiated Contact with Patient 12/17/14 [phone]     Chief Complaint  Patient presents with  . Armpit pain    . Abdominal Pain  . Emesis   HPI  Rebecca Moyer is a 29 year old female with PMHx of DM2, asthma and hidradenitis presenting with right axillary pain and abdominal pain. Pt was seen by [facility] ED 3 day ago for same complaints and denies improvement. She had bilateral excision of hidradenitis performed by Dr. Harlow Asa on 7/19. She reports that her right axilla is still extremely painful. She went to [facility] for this yesterday and they diagnosed her with tinea corporis and gave her lotrimin cream. She reports compliance with this but states the pain is persistent. She denies drainage from the wound but states there is occasionally a foul smell. She had a scheduled follow up appointment with Dr. Harlow Asa on 8/1 but did not go. She states that she has attempted to contact his office multiple times for follow up appointments but reports they do not return her calls. She is also complaining of left sided abdominal pain x 1 week. Pain is described as cramping and associated with nausea, vomiting and diarrhea. Denies blood in vomit or stool. She was seen by [facility] ED for this 3 days ago and discharged with zofran. She states zofran helps with the nausea but that she was still experiencing the cramping and diarrhea. She states that the pain has not worsened or improved since the last visit. States that she still has a good appetite and can keep fluids and solids down. Denies fevers, chills, body aches, dizziness, headache, chest pain, SOB, dysuria or vaginal discharge.   Past Medical History  Diagnosis Date  . Asthma 06/07/11  . H/O varicella   . H/O amenorrhea 01/27/04  . Irregular periods/menstrual cycles 05/22/04  . Depression 06/07/11  . Hypertension 06/07/11  . PCOS (polycystic ovarian syndrome) 06/07/11  . Obesity 06/07/11  . Smoker 06/07/11  .  Diabetes mellitus without complication ([place])   . Arthritis     knees  . GERD (gastroesophageal reflux disease)   . Difficulty swallowing solids     due to GERD and slow motility of esophagus, per pt.  . Anxiety   . Non-insulin dependent type 2 diabetes mellitus ([place])   . Bipolar disorder ([place])   . Schizophrenia ([place])   . Depression   . Hypertension     states under control with med., has been on med. x 6 yr.  . Asthma     prn inhaler  . Multiple personality disorder   . Hidradenitis 09/2014    bilateral axilla, left buttock, left groin  . Nasal congestion 09/27/2014  . Tachycardia     no cardiologist, per pt.  . Sleep apnea     does not use CPAP every night, per pt.   Past Surgical History  Procedure Laterality Date  . Tonsillectomy  06/07/11  . Wisdom tooth extraction  06/07/11  . Adnoids  06/07/11  . Tonsillectomy and adenoidectomy  06/26/2000  . Wisdom tooth extraction    . Hydradenitis excision Bilateral 09/28/2014    Procedure: EXCISION HIDRADENITIS BILATERAL AXILLARY;  Surgeon: Armandina Gemma, MD;  Location: [facility];  Service: General;  Laterality: Bilateral;   Family History  Problem Relation Age of Onset  . Graves' disease Mother    Social History  Substance Use Topics  . Smoking  status: Current Every Day Smoker -- 0.50 packs/day for 8 years    Types: Cigarettes  . Smokeless tobacco: Never Used  . Alcohol Use: No   OB History    Gravida Para Term Preterm AB TAB SAB Ectopic Multiple Living   0 0 0 0 0 0 0 0       Review of Systems  Constitutional: Negative for fever, chills, appetite change and fatigue.  Respiratory: Negative for shortness of breath.   Cardiovascular: Negative for chest pain.  Gastrointestinal: Positive for nausea, vomiting, abdominal pain and diarrhea. Negative for blood in stool and abdominal distention.  Genitourinary: Negative for dysuria and vaginal discharge.  Musculoskeletal: Negative for back pain.  Skin: Positive for  wound.  Neurological: Negative for dizziness, syncope and headaches.      Allergies  Fentanyl; Penicillins; Triamterene; Naproxen; and Ultram  Home Medications   Prior to Admission medications   Medication Sig Start Date End Date Taking? Authorizing Provider  albuterol (PROVENTIL HFA;VENTOLIN HFA) 108 (90 BASE) MCG/ACT inhaler Inhale 2 puffs into the lungs every 6 (six) hours as needed for wheezing or shortness of breath. 01/01/14  Yes Ankit Kathrynn Humble, MD  albuterol (PROVENTIL) (2.5 MG/3ML) 0.083% nebulizer solution Take 2.5 mg by nebulization every 8 (eight) hours as needed. Shortness of breath   Yes Historical Provider, MD  ALPRAZolam (XANAX) 1 MG tablet Take 1 mg by mouth 3 (three) times daily.    Yes Historical Provider, MD  atenolol (TENORMIN) 50 MG tablet Take 50 mg by mouth every evening.  01/20/14  Yes Historical Provider, MD  diltiazem (DILACOR XR) 120 MG 24 hr capsule Take 120 mg by mouth daily.   Yes Historical Provider, MD  fenofibrate (TRICOR) 145 MG tablet Take 145 mg by mouth every evening.    Yes Historical Provider, MD  haloperidol (HALDOL) 0.5 MG tablet Take 0.5 mg by mouth every evening.    Yes Historical Provider, MD  Liraglutide (VICTOZA) 18 MG/3ML SOPN Inject 1.8 mg into the skin daily.    Yes Historical Provider, MD  metFORMIN (GLUCOPHAGE) 1000 MG tablet Take 1,000 mg by mouth daily with breakfast.   Yes Historical Provider, MD  mirtazapine (REMERON) 30 MG tablet Take 30 mg by mouth at bedtime.   Yes Historical Provider, MD  omeprazole (PRILOSEC) 40 MG capsule Take 40 mg by mouth daily.   Yes Historical Provider, MD  doxycycline (VIBRAMYCIN) 100 MG capsule Take 1 capsule (100 mg total) by mouth 2 (two) times daily. Patient not taking: Reported on 10/18/2014 09/29/14   Elmyra Ricks Pisciotta, PA-C  HYDROcodone-acetaminophen (NORCO/VICODIN) 5-325 MG per tablet Take 1 tablet by mouth every 6 (six) hours as needed for moderate pain. Patient not taking: Reported on 09/29/2014  09/08/14   Junius Creamer, NP  HYDROcodone-acetaminophen (NORCO/VICODIN) 5-325 MG per tablet Take 1-2 tablets by mouth every 4 (four) hours as needed for moderate pain. Patient not taking: Reported on 09/29/2014 09/28/14   Armandina Gemma, MD  ibuprofen (ADVIL,MOTRIN) 600 MG tablet Take 1 tablet (600 mg total) by mouth every 8 (eight) hours as needed. Patient not taking: Reported on 12/17/2014 10/04/14   Jola Schmidt, MD  NON FORMULARY cpap    Historical Provider, MD  Nystatin POWD 1 application by Does not apply route 2 (two) times daily. Patient not taking: Reported on 12/17/2014 10/18/14   Ruby Cola, MD  ondansetron (ZOFRAN) 4 MG tablet Take 1 tablet (4 mg total) by mouth every 6 (six) hours. 12/17/14   Aasia Peavler, PA-C  oxyCODONE-acetaminophen (  PERCOCET/ROXICET) 5-325 MG per tablet 1 to 2 tabs PO q6hrs  PRN for pain Patient not taking: Reported on 10/02/2014 09/29/14   Elmyra Ricks Pisciotta, PA-C  triamcinolone cream (KENALOG) 0.1 % Apply 1 application topically 2 (two) times daily. Patient not taking: Reported on 08/30/2014 08/21/14   Gregor Hams, MD   BP 115/66 mmHg  Pulse 76  Temp(Src) 98 F (36.7 C) (Oral)  Resp 16  SpO2 95%  LMP 11/14/2014 (Exact Date) Physical Exam  Constitutional: She appears well-developed and well-nourished. No distress.  HENT:  Head: Normocephalic and atraumatic.  Eyes: Conjunctivae are normal. Right eye exhibits no discharge. Left eye exhibits no discharge. No scleral icterus.  Neck: Normal range of motion.  Cardiovascular: Normal rate and regular rhythm.   Pulmonary/Chest: Effort normal. No respiratory distress.  Abdominal: Soft. Bowel sounds are normal. She exhibits no distension. There is tenderness. There is no rebound and no guarding.  Abdomen is soft. LUQ and LLQ TTP. No rebound or guarding present.  Musculoskeletal: Normal range of motion.  Moves extremities spontaneously and walks with a steady gait.  Neurological: She is alert. Coordination normal.  Skin:  Skin is warm and dry.  RIGHT AXILLA Lotrimin cream present. Wiped away revealing well healing surgical scar. Some surrounding erythema. Area of induration inferior to surgical scar as noted on previous ED visits. No drainage from wound.  LEFT AXILLA Well healing surgical scar without drainage. Some surrounding erythema present. No induration.   Psychiatric: She has a normal mood and affect. Her behavior is normal.  Nursing note and vitals reviewed.   ED Course  Procedures (including critical care time) Labs Review Labs Reviewed  COMPREHENSIVE METABOLIC PANEL - Abnormal; Notable for the following:    CO2 18 (*)    Glucose, Bld 268 (*)    Total Protein 6.1 (*)    Anion gap 16 (*)    All other components within normal limits  CBC - Abnormal; Notable for the following:    Hemoglobin 15.1 (*)    All other components within normal limits  URINALYSIS, ROUTINE W REFLEX MICROSCOPIC (NOT AT [facility]) - Abnormal; Notable for the following:    Specific Gravity, Urine 1.031 (*)    Glucose, UA >1000 (*)    All other components within normal limits  I-STAT CG4 LACTIC ACID, ED - Abnormal; Notable for the following:    Lactic Acid, Venous 2.12 (*)    All other components within normal limits  LIPASE, BLOOD  URINE MICROSCOPIC-ADD ON  I-STAT BETA HCG BLOOD, ED ([facility], [facility], [facility] ONLY)  I-STAT CG4 LACTIC ACID, ED    Imaging Review Ct Abdomen Pelvis W Contrast  12/17/2014   CLINICAL DATA:  Abdominal pain.  EXAM: CT ABDOMEN AND PELVIS WITH CONTRAST  TECHNIQUE: Multidetector CT imaging of the abdomen and pelvis was performed using the standard protocol following bolus administration of intravenous contrast.  CONTRAST:  125mL OMNIPAQUE IOHEXOL 300 MG/ML  SOLN  COMPARISON:  None.  FINDINGS: Lower chest:  Mild left basilar atelectasis.  Normal heart size.  Hepatobiliary: Diffuse low attenuation of the liver as can be seen with hepatic steatosis. Normal gallbladder.  Pancreas: Normal.  Spleen: Normal.   Adrenals/Urinary Tract: Normal adrenal glands. Normal kidneys. No urolithiasis or obstructive uropathy. Normal bladder.  Stomach/Bowel: No bowel wall thickening or bowel dilatation. Normal appendix. No pneumatosis, pneumoperitoneum or portal venous gas.  Vascular/Lymphatic: Normal abdominal aorta. No abdominal or pelvic lymphadenopathy.  Reproductive: Normal uterus and adnexa.  Other: No fluid collection or hematoma.  No abdominal or pelvic free fluid.  Musculoskeletal: No acute osseous abnormality. Mild degenerative changes of bilateral SI joints. No aggressive lytic or sclerotic osseous lesion.  IMPRESSION: 1. No acute abdominal or pelvic pathology. 2. Hepatic steatosis.   Electronically Signed   By: Kathreen Devoid   On: 12/17/2014 10:36   I have personally reviewed and evaluated these images and lab results as part of my medical decision-making.   EKG Interpretation None      MDM   Final diagnoses:  Abdominal pain, unspecified abdominal location  Nausea and vomiting, vomiting of unspecified type  Axillary pain, right  645 - Pt presenting with axillary pain and abdominal pain. Pt has had persistent bilateral axillary pain s/p hidradenitis surgical repair in July. Pt has been seen in ED multiple times for this and treated with courses of bactrim, doxycycline, diflucan and lotrimin. Pt has had multiple scheduled follow up appointments with Dr. Harlow Asa and has missed them. Pt also complaining of 1 week of nausea, vomiting, diarrhea and left sided abdominal pain. Pt was seen by [facility] ED 2 days ago for these symptoms and reports no improvement though she states that the symptoms have not gotten worse either. VSS. Pt is nontoxic appearing. Axilla with well healing surgical scars, no signs of current infection. Abdomen is soft and mildly tender in LUQ and LLQ. Lactic acid 2.12; chart review shows it was elevated to 2.2 at [facility] ED and has been persistently elevated since July. WBC 8.6.  Due to persistent elevated  lactic acid and anion gap of 16, will CT scan abdomen. Remaining labwork reassuring. Will treat symptoms with percocet and zofran. 900 - Pt reports significant improvement in symptoms after 1 L bolus, zofran and percocet. CT negative for acute pathology. Pt reports that she feels "back to normal" and feels that she is ready to go home. Discussed with pt that she needs to follow up with Dr. Harlow Asa for persistent axillary pain. She expresses understanding and states that she will follow up this afternoon. Will give zofran for nausea. Discussed that her CT was negative and this could likely be a gastroenteritis. Advised to f/u with PCP if symptoms persist into next week. Return precautions given in discharge paperwork and discussed with pt at bedside. Pt stable for discharge     Josephina Gip, PA-C 12/17/14 [place], DO 12/20/14 1528

## 2015-01-12 IMAGING — CR DG SACRUM/COCCYX 2+V
3 series · 3 of 3 positions shown · non-contrast
Comparison: None.

CLINICAL DATA: Coccyx pain following fall

:
SACRUM AND COCCYX - 2+ VIEW

[t sacrum a.p.]
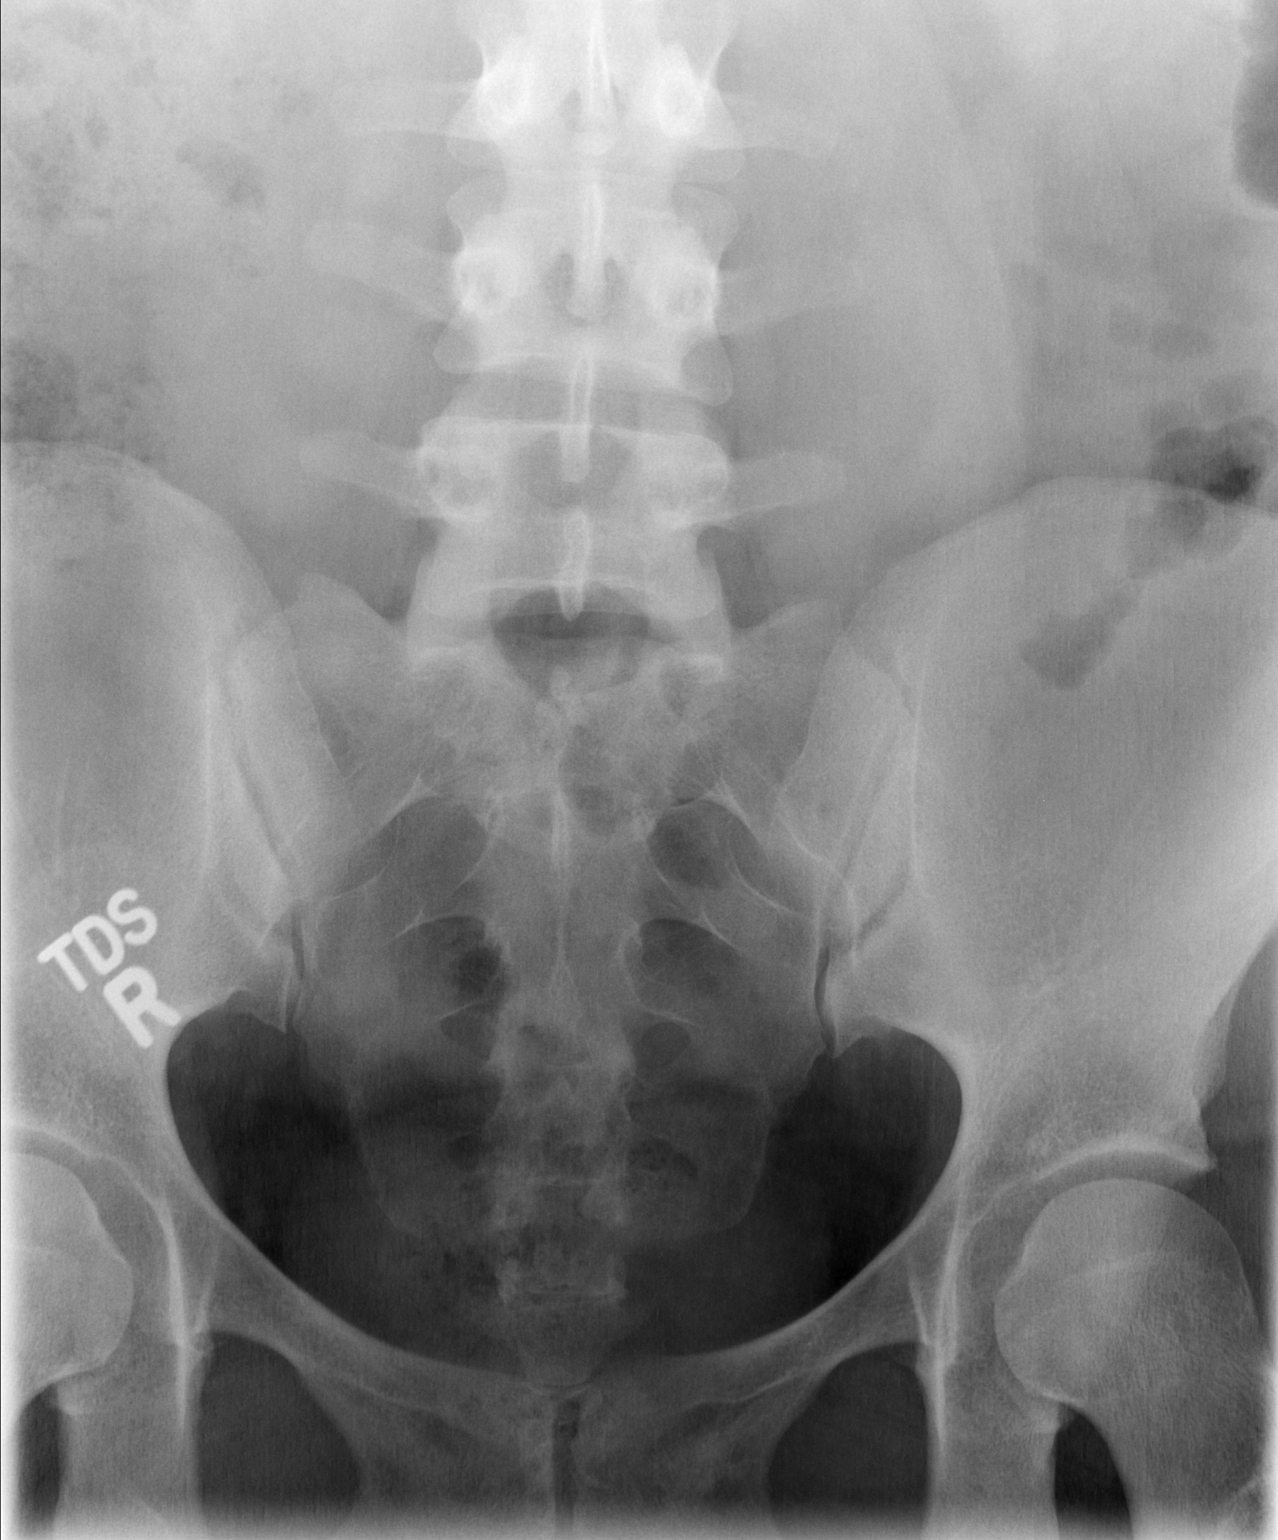

[t coccyx a.p.]
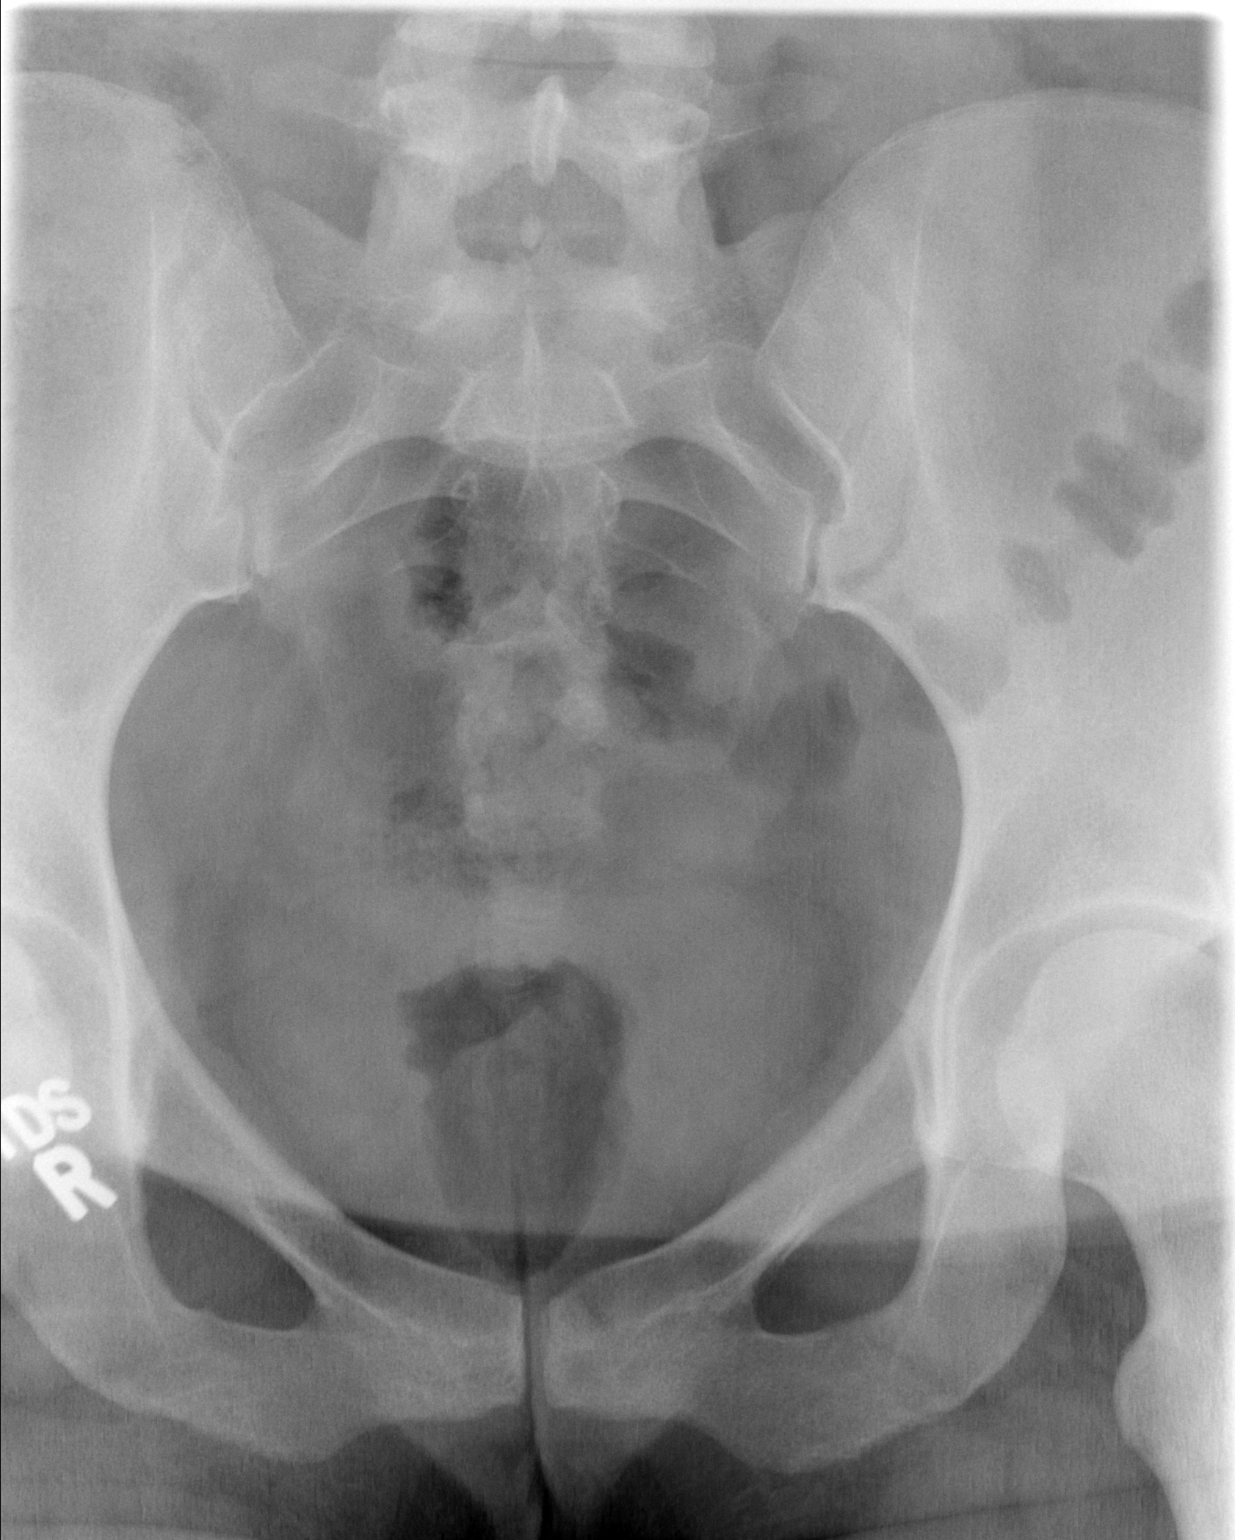

[t sacrum lat]
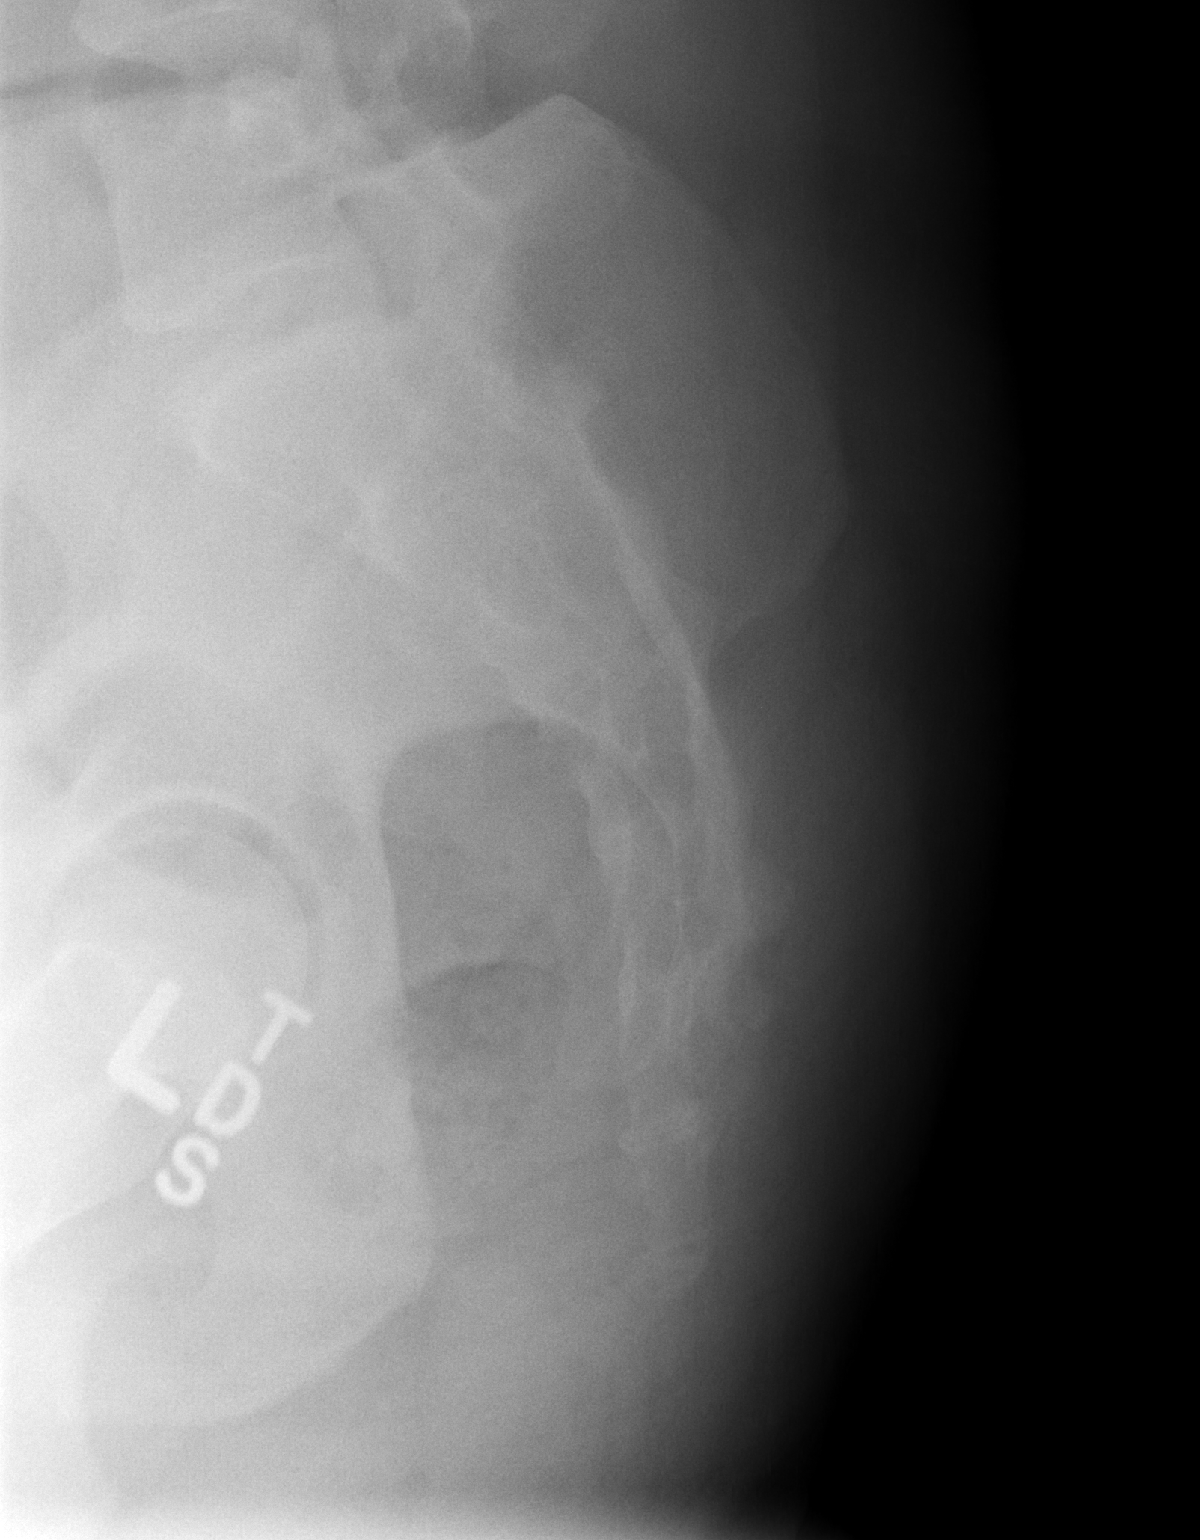

[3 of 3 positions shown; findings below may reference images not displayed]

FINDINGS: There is no evidence of fracture or other focal bone lesions
IMPRESSION: Negative.

## 2015-01-25 ENCOUNTER — Ambulatory Visit (HOSPITAL_BASED_OUTPATIENT_CLINIC_OR_DEPARTMENT_OTHER): Payer: Medicare Other

## 2015-04-13 ENCOUNTER — Ambulatory Visit (HOSPITAL_BASED_OUTPATIENT_CLINIC_OR_DEPARTMENT_OTHER): Payer: Medicare Other

## 2015-05-23 ENCOUNTER — Emergency Department (HOSPITAL_COMMUNITY)
Admission: EM | Admit: 2015-05-23 | Discharge: 2015-05-23 | Disposition: A | Discharge status: Home / Self Care | Payer: Medicaid Other | Attending: Emergency Medicine | Admitting: Emergency Medicine

## 2015-05-23 ENCOUNTER — Encounter (HOSPITAL_COMMUNITY): Payer: Self-pay | Admitting: Emergency Medicine

## 2015-05-23 DIAGNOSIS — M170 Bilateral primary osteoarthritis of knee: Secondary | ICD-10-CM | POA: Diagnosis not present

## 2015-05-23 DIAGNOSIS — F419 Anxiety disorder, unspecified: Secondary | ICD-10-CM | POA: Diagnosis not present

## 2015-05-23 DIAGNOSIS — Z79899 Other long term (current) drug therapy: Secondary | ICD-10-CM | POA: Diagnosis not present

## 2015-05-23 DIAGNOSIS — Z7984 Long term (current) use of oral hypoglycemic drugs: Secondary | ICD-10-CM | POA: Insufficient documentation

## 2015-05-23 DIAGNOSIS — L748 Other eccrine sweat disorders: Secondary | ICD-10-CM | POA: Insufficient documentation

## 2015-05-23 DIAGNOSIS — F319 Bipolar disorder, unspecified: Secondary | ICD-10-CM | POA: Diagnosis not present

## 2015-05-23 DIAGNOSIS — Z880 Allergy status to penicillin: Secondary | ICD-10-CM | POA: Diagnosis not present

## 2015-05-23 DIAGNOSIS — E669 Obesity, unspecified: Secondary | ICD-10-CM | POA: Insufficient documentation

## 2015-05-23 DIAGNOSIS — E119 Type 2 diabetes mellitus without complications: Secondary | ICD-10-CM | POA: Diagnosis not present

## 2015-05-23 DIAGNOSIS — Z8619 Personal history of other infectious and parasitic diseases: Secondary | ICD-10-CM | POA: Insufficient documentation

## 2015-05-23 DIAGNOSIS — I10 Essential (primary) hypertension: Secondary | ICD-10-CM | POA: Diagnosis not present

## 2015-05-23 DIAGNOSIS — F17210 Nicotine dependence, cigarettes, uncomplicated: Secondary | ICD-10-CM | POA: Insufficient documentation

## 2015-05-23 DIAGNOSIS — K219 Gastro-esophageal reflux disease without esophagitis: Secondary | ICD-10-CM | POA: Diagnosis not present

## 2015-05-23 DIAGNOSIS — Z8669 Personal history of other diseases of the nervous system and sense organs: Secondary | ICD-10-CM | POA: Diagnosis not present

## 2015-05-23 DIAGNOSIS — Z8742 Personal history of other diseases of the female genital tract: Secondary | ICD-10-CM | POA: Insufficient documentation

## 2015-05-23 DIAGNOSIS — J45909 Unspecified asthma, uncomplicated: Secondary | ICD-10-CM | POA: Diagnosis not present

## 2015-05-23 DIAGNOSIS — R1909 Other intra-abdominal and pelvic swelling, mass and lump: Secondary | ICD-10-CM | POA: Diagnosis present

## 2015-05-23 DIAGNOSIS — Z8639 Personal history of other endocrine, nutritional and metabolic disease: Secondary | ICD-10-CM | POA: Insufficient documentation

## 2015-05-23 MED ORDER — OXYCODONE-ACETAMINOPHEN 5-325 MG PO TABS
1.0000 | ORAL_TABLET | Freq: Once | ORAL | Status: AC
  Administered 2015-05-23: 1 via ORAL
  Filled 2015-05-23: qty 1

## 2015-05-23 MED ORDER — SULFAMETHOXAZOLE-TRIMETHOPRIM 800-160 MG PO TABS: 1.0000 | ORAL_TABLET | Freq: Two times a day (BID) | ORAL | Status: AC

## 2015-05-23 MED ORDER — HYDROCODONE-ACETAMINOPHEN 5-325 MG PO TABS: 2.0000 | ORAL_TABLET | ORAL | Status: DC | PRN

## 2015-05-23 NOTE — ED Notes (Signed)
Pain medication given in Triage. Patient advised about side effects of medications and  to avoid driving for a minimum of 4 hours.  

## 2015-05-23 NOTE — ED Notes (Signed)
Pt states that she has an abscess under rt arm x 3 months and a vaginal abscess x 6 days.  States that she keeps messing with the one under her arm and is able to squeeze stuff out of it.

## 2015-05-23 NOTE — ED Provider Notes (Signed)
CSN: [id number]     Arrival date & time 05/23/15  0150 History   First MD Initiated Contact with Patient 05/23/15 0346     Chief Complaint  Patient presents with  . Abscess     (Consider location/radiation/quality/duration/timing/severity/associated sxs/prior Treatment) Patient is a 30 y.o. female presenting with abscess. The history is provided by the patient. No language interpreter was used.  Abscess Location:  Shoulder/arm and ano-genital Shoulder/arm abscess location:  R axilla Ano-genital abscess location:  Groin Abscess quality: painful, redness and warmth   Red streaking: no   Progression:  Worsening Pain details:    Quality:  Aching   Severity:  Moderate   Timing:  Intermittent   Progression:  Worsening Chronicity:  Recurrent Context: diabetes   Relieved by:  Nothing Worsened by:  Nothing tried Pt has hidradenitis.  Pt has had surgery bilat adnexa. Pt has one swollen area right axilla and one area right groin  Past Medical History  Diagnosis Date  . Asthma 06/07/11  . H/O varicella   . H/O amenorrhea 01/27/04  . Irregular periods/menstrual cycles 05/22/04  . Depression 06/07/11  . Hypertension 06/07/11  . PCOS (polycystic ovarian syndrome) 06/07/11  . Obesity 06/07/11  . Smoker 06/07/11  . Diabetes mellitus without complication ([place])   . Arthritis     knees  . GERD (gastroesophageal reflux disease)   . Difficulty swallowing solids     due to GERD and slow motility of esophagus, per pt.  . Anxiety   . Non-insulin dependent type 2 diabetes mellitus ([place])   . Bipolar disorder ([place])   . Schizophrenia ([place])   . Depression   . Hypertension     states under control with med., has been on med. x 6 yr.  . Asthma     prn inhaler  . Multiple personality disorder   . Hidradenitis 09/2014    bilateral axilla, left buttock, left groin  . Nasal congestion 09/27/2014  . Tachycardia     no cardiologist, per pt.  . Sleep apnea     does not use CPAP every night, per pt.    Past Surgical History  Procedure Laterality Date  . Tonsillectomy  06/07/11  . Wisdom tooth extraction  06/07/11  . Adnoids  06/07/11  . Tonsillectomy and adenoidectomy  06/26/2000  . Wisdom tooth extraction    . Hydradenitis excision Bilateral 09/28/2014    Procedure: EXCISION HIDRADENITIS BILATERAL AXILLARY;  Surgeon: Armandina Gemma, MD;  Location: [facility];  Service: General;  Laterality: Bilateral;   Family History  Problem Relation Age of Onset  . Graves' disease Mother    Social History  Substance Use Topics  . Smoking status: Current Every Day Smoker -- 0.50 packs/day for 8 years    Types: Cigarettes  . Smokeless tobacco: Never Used  . Alcohol Use: No   OB History    Gravida Para Term Preterm AB TAB SAB Ectopic Multiple Living   0 0 0 0 0 0 0 0       Review of Systems  All other systems reviewed and are negative.     Allergies  Fentanyl; Penicillins; Triamterene; Naproxen; and Ultram  Home Medications   Prior to Admission medications   Medication Sig Start Date End Date Taking? Authorizing Provider  albuterol (PROVENTIL HFA;VENTOLIN HFA) 108 (90 BASE) MCG/ACT inhaler Inhale 2 puffs into the lungs every 6 (six) hours as needed for wheezing or shortness of breath. 01/01/14   Varney Biles, MD  albuterol (PROVENTIL) (  2.5 MG/3ML) 0.083% nebulizer solution Take 2.5 mg by nebulization every 8 (eight) hours as needed. Shortness of breath    Historical Provider, MD  ALPRAZolam Duanne Moron) 1 MG tablet Take 1 mg by mouth 3 (three) times daily.     Historical Provider, MD  atenolol (TENORMIN) 50 MG tablet Take 50 mg by mouth every evening.  01/20/14   Historical Provider, MD  diltiazem (DILACOR XR) 120 MG 24 hr capsule Take 120 mg by mouth daily.    Historical Provider, MD  doxycycline (VIBRAMYCIN) 100 MG capsule Take 1 capsule (100 mg total) by mouth 2 (two) times daily. Patient not taking: Reported on 10/18/2014 09/29/14   Elmyra Ricks Pisciotta, PA-C  fenofibrate  (TRICOR) 145 MG tablet Take 145 mg by mouth every evening.     Historical Provider, MD  haloperidol (HALDOL) 0.5 MG tablet Take 0.5 mg by mouth every evening.     Historical Provider, MD  HYDROcodone-acetaminophen (NORCO/VICODIN) 5-325 MG tablet Take 2 tablets by mouth every 4 (four) hours as needed. 05/23/15   Fransico Meadow, PA-C  ibuprofen (ADVIL,MOTRIN) 600 MG tablet Take 1 tablet (600 mg total) by mouth every 8 (eight) hours as needed. Patient not taking: Reported on 12/17/2014 10/04/14   Jola Schmidt, MD  Liraglutide (VICTOZA) 18 MG/3ML SOPN Inject 1.8 mg into the skin daily.     Historical Provider, MD  metFORMIN (GLUCOPHAGE) 1000 MG tablet Take 1,000 mg by mouth daily with breakfast.    Historical Provider, MD  mirtazapine (REMERON) 30 MG tablet Take 30 mg by mouth at bedtime.    Historical Provider, MD  NON FORMULARY cpap    Historical Provider, MD  Nystatin POWD 1 application by Does not apply route 2 (two) times daily. Patient not taking: Reported on 12/17/2014 10/18/14   Ruby Cola, MD  omeprazole (PRILOSEC) 40 MG capsule Take 40 mg by mouth daily.    Historical Provider, MD  ondansetron (ZOFRAN) 4 MG tablet Take 1 tablet (4 mg total) by mouth every 6 (six) hours. 12/17/14   Stevi Barrett, PA-C  sulfamethoxazole-trimethoprim (BACTRIM DS,SEPTRA DS) 800-160 MG tablet Take 1 tablet by mouth 2 (two) times daily. 05/23/15 05/30/15  Fransico Meadow, PA-C  triamcinolone cream (KENALOG) 0.1 % Apply 1 application topically 2 (two) times daily. Patient not taking: Reported on 08/30/2014 08/21/14   Gregor Hams, MD   BP 125/78 mmHg  Pulse 66  Temp(Src) 98.3 F (36.8 C) (Oral)  Resp 18  Ht 5\' 9"  (1.753 m)  Wt 117.935 kg  BMI 38.38 kg/m2  SpO2 98% Physical Exam  Constitutional: She is oriented to person, place, and time. She appears well-developed and well-nourished.  HENT:  Head: Normocephalic.  Eyes: EOM are normal.  Neck: Normal range of motion.  Pulmonary/Chest: Effort normal.  Abdominal:  She exhibits no distension.  Musculoskeletal: Normal range of motion.  Pea size area right adnexa,  Drains when pt squeezes area, 1cm area right groin, no fluctuance.    Neurological: She is alert and oriented to person, place, and time.  Psychiatric: She has a normal mood and affect.  Nursing note and vitals reviewed.   ED Course  Procedures (including critical care time) Labs Review Labs Reviewed - No data to display  Imaging Review No results found. I have personally reviewed and evaluated these images and lab results as part of my medical decision-making.   EKG Interpretation None      MDM I counseled pt,  I don't think I and D when  benefit either area.   I will treat with bactrim and hydrocodone.  Pt advised to follow up with her MD.   Final diagnoses:  Multiple sweat gland abscesses    An After Visit Summary was printed and given to the patient.    Hollace Kinnier [place], PA-C 05/23/15 [place], MD 05/23/15 [phone]

## 2015-05-23 NOTE — Discharge Instructions (Signed)

## 2015-10-03 ENCOUNTER — Emergency Department (HOSPITAL_COMMUNITY)
Admission: EM | Admit: 2015-10-03 | Discharge: 2015-10-03 | Disposition: A | Discharge status: Home / Self Care | Payer: Medicaid Other | Attending: Emergency Medicine | Admitting: Emergency Medicine

## 2015-10-03 ENCOUNTER — Encounter (HOSPITAL_COMMUNITY): Payer: Self-pay | Admitting: *Deleted

## 2015-10-03 ENCOUNTER — Emergency Department (HOSPITAL_COMMUNITY): Payer: Medicaid Other

## 2015-10-03 DIAGNOSIS — F209 Schizophrenia, unspecified: Secondary | ICD-10-CM | POA: Insufficient documentation

## 2015-10-03 DIAGNOSIS — R197 Diarrhea, unspecified: Secondary | ICD-10-CM | POA: Diagnosis not present

## 2015-10-03 DIAGNOSIS — I10 Essential (primary) hypertension: Secondary | ICD-10-CM | POA: Insufficient documentation

## 2015-10-03 DIAGNOSIS — Z79899 Other long term (current) drug therapy: Secondary | ICD-10-CM | POA: Diagnosis not present

## 2015-10-03 DIAGNOSIS — Z7984 Long term (current) use of oral hypoglycemic drugs: Secondary | ICD-10-CM | POA: Diagnosis not present

## 2015-10-03 DIAGNOSIS — R112 Nausea with vomiting, unspecified: Secondary | ICD-10-CM | POA: Insufficient documentation

## 2015-10-03 DIAGNOSIS — R1084 Generalized abdominal pain: Secondary | ICD-10-CM | POA: Insufficient documentation

## 2015-10-03 DIAGNOSIS — E1165 Type 2 diabetes mellitus with hyperglycemia: Secondary | ICD-10-CM | POA: Diagnosis not present

## 2015-10-03 DIAGNOSIS — R0789 Other chest pain: Secondary | ICD-10-CM | POA: Diagnosis not present

## 2015-10-03 DIAGNOSIS — J45909 Unspecified asthma, uncomplicated: Secondary | ICD-10-CM | POA: Insufficient documentation

## 2015-10-03 DIAGNOSIS — F17210 Nicotine dependence, cigarettes, uncomplicated: Secondary | ICD-10-CM | POA: Insufficient documentation

## 2015-10-03 DIAGNOSIS — R739 Hyperglycemia, unspecified: Secondary | ICD-10-CM

## 2015-10-03 LAB — CBC WITH DIFFERENTIAL/PLATELET
BAND NEUTROPHILS: 0 %
BASOS ABS: 0.1 10*3/uL (ref 0.0–0.1)
BASOS PCT: 1 %
BLASTS: 0 %
EOS ABS: 0 10*3/uL (ref 0.0–0.7)
Eosinophils Relative: 0 %
HEMATOCRIT: 40.7 % (ref 36.0–46.0)
Hemoglobin: 15.2 g/dL — ABNORMAL HIGH (ref 12.0–15.0)
LYMPHS PCT: 24 %
Lymphs Abs: 2.3 10*3/uL (ref 0.7–4.0)
MCH: 32.1 pg (ref 26.0–34.0)
MCHC: 37 g/dL — ABNORMAL HIGH (ref 30.0–36.0)
MCV: 85.9 fL (ref 78.0–100.0)
METAMYELOCYTES PCT: 0 %
MONO ABS: 0.2 10*3/uL (ref 0.1–1.0)
Monocytes Relative: 2 %
Myelocytes: 0 %
NEUTROS ABS: 7.1 10*3/uL (ref 1.7–7.7)
Neutrophils Relative %: 73 %
OTHER: 0 %
PLATELETS: 249 10*3/uL (ref 150–400)
PROMYELOCYTES ABS: 0 %
RBC: 4.74 MIL/uL (ref 3.87–5.11)
RDW: 13.2 % (ref 11.5–15.5)
WBC: 9.7 10*3/uL (ref 4.0–10.5)
nRBC: 0 /100 WBC

## 2015-10-03 LAB — COMPREHENSIVE METABOLIC PANEL
ALBUMIN: 3.9 g/dL (ref 3.5–5.0)
ALT: 40 U/L (ref 14–54)
AST: 34 U/L (ref 15–41)
Alkaline Phosphatase: 44 U/L (ref 38–126)
Anion gap: 8 (ref 5–15)
BUN: 10 mg/dL (ref 6–20)
CHLORIDE: 109 mmol/L (ref 101–111)
CO2: 17 mmol/L — ABNORMAL LOW (ref 22–32)
CREATININE: 0.63 mg/dL (ref 0.44–1.00)
Calcium: 8.6 mg/dL — ABNORMAL LOW (ref 8.9–10.3)
GFR calc Af Amer: >60 mL/min (ref >60)
GFR calc non Af Amer: >60 mL/min (ref >60)
GLUCOSE: 265 mg/dL — AB (ref 65–99)
POTASSIUM: 3.8 mmol/L (ref 3.5–5.1)
Sodium: 134 mmol/L — ABNORMAL LOW (ref 135–145)
Total Bilirubin: 0.5 mg/dL (ref 0.3–1.2)
Total Protein: 6.6 g/dL (ref 6.5–8.1)

## 2015-10-03 LAB — LIPASE, BLOOD: Lipase: 28 U/L (ref 11–51)

## 2015-10-03 LAB — URINALYSIS, ROUTINE W REFLEX MICROSCOPIC
Bilirubin Urine: NEGATIVE
Glucose, UA: >1000 mg/dL — AB
Ketones, ur: NEGATIVE mg/dL
LEUKOCYTES UA: NEGATIVE
NITRITE: NEGATIVE
PROTEIN: NEGATIVE mg/dL
SPECIFIC GRAVITY, URINE: 1.01 (ref 1.005–1.030)
pH: 6 (ref 5.0–8.0)

## 2015-10-03 LAB — TROPONIN I: Troponin I: <0.03 ng/mL (ref <0.03)

## 2015-10-03 LAB — URINE MICROSCOPIC-ADD ON

## 2015-10-03 LAB — PREGNANCY, URINE: PREG TEST UR: NEGATIVE

## 2015-10-03 MED ORDER — LOPERAMIDE HCL 2 MG PO CAPS: 2.0000 mg | ORAL_CAPSULE | Freq: Four times a day (QID) | ORAL | 0 refills | Status: DC | PRN

## 2015-10-03 MED ORDER — MORPHINE SULFATE (PF) 4 MG/ML IV SOLN
4.0000 mg | Freq: Once | INTRAVENOUS | Status: AC
  Administered 2015-10-03: 4 mg via INTRAVENOUS
  Filled 2015-10-03: qty 1

## 2015-10-03 MED ORDER — ONDANSETRON 4 MG PO TBDP: 4.0000 mg | ORAL_TABLET | Freq: Three times a day (TID) | ORAL | 0 refills | Status: DC | PRN

## 2015-10-03 MED ORDER — SODIUM CHLORIDE 0.9 % IV BOLUS (SEPSIS)
1000.0000 mL | Freq: Once | INTRAVENOUS | Status: AC
Start: 2015-10-03 — End: 2015-10-03
  Administered 2015-10-03: 1000 mL via INTRAVENOUS

## 2015-10-03 MED ORDER — ONDANSETRON HCL 4 MG/2ML IJ SOLN
4.0000 mg | Freq: Once | INTRAMUSCULAR | Status: AC
  Administered 2015-10-03: 4 mg via INTRAVENOUS
  Filled 2015-10-03: qty 2

## 2015-10-03 MED ORDER — IBUPROFEN 800 MG PO TABS: 800.0000 mg | ORAL_TABLET | Freq: Three times a day (TID) | ORAL | 0 refills | Status: DC | PRN

## 2015-10-03 MED ORDER — KETOROLAC TROMETHAMINE 30 MG/ML IJ SOLN
30.0000 mg | Freq: Once | INTRAMUSCULAR | Status: AC
  Administered 2015-10-03: 30 mg via INTRAVENOUS
  Filled 2015-10-03: qty 1

## 2015-10-03 MED ORDER — SODIUM CHLORIDE 0.9 % IV BOLUS (SEPSIS)
1000.0000 mL | Freq: Once | INTRAVENOUS | Status: AC
  Administered 2015-10-03: 1000 mL via INTRAVENOUS

## 2015-10-03 NOTE — ED Notes (Signed)
Pt states that she was admitted 3 weeks ago for dka at [facility], reports that she has not been able to check her blood sugar at home due to machine not reading right,

## 2015-10-03 NOTE — ED Provider Notes (Signed)
TIME SEEN: 2:50 AM  CHIEF COMPLAINT: Chest pain, abdominal pain, nausea, vomiting, diarrhea  HPI: Pt is a 30 y.o. female with history of diabetes, obesity, bipolar disorder, asthma who presents the emergency department with multiple complaints. Reports her the past 4 days she has had nausea, vomiting and diarrhea. States she is also having diffuse all over "burning" abdominal pain and it feels like "needles" stabbing her. States that she has needlelike stabbing pain diffusely throughout her chest worse with palpation, movement and deep inspiration. She is also had chills, fatigue. Denies history of abdominal surgery. Last [place]. Was 3 weeks ago. Denies dysuria, hematuria, vaginal bleeding or discharge. States this feels like her previous episodes of DKA. She states she went to [facility] night and the doctor "really like I was crazy" so she left and came to our emergency department. She has an up with her PCP at the end of this week.  No history of CAD.  No history of PE, DVT, exogenous estrogen use, fracture, surgery, trauma, recent hospitalization, prolonged travel. No lower extremity swelling or pain. No calf tenderness.   ROS: See HPI Constitutional: no fever  Eyes: no drainage  ENT: no runny nose   Cardiovascular:   chest pain  Resp: no SOB  GI: vomiting GU: no dysuria Integumentary: no rash  Allergy: no hives  Musculoskeletal: no leg swelling  Neurological: no slurred speech ROS otherwise negative  PAST MEDICAL HISTORY/PAST SURGICAL HISTORY:  Past Medical History:  Diagnosis Date  . Anxiety   . Arthritis    knees  . Asthma 06/07/11  . Asthma    prn inhaler  . Bipolar disorder ([place])   . Depression 06/07/11  . Depression   . Diabetes mellitus without complication ([place])   . Difficulty swallowing solids    due to GERD and slow motility of esophagus, per pt.  Marland Kitchen GERD (gastroesophageal reflux disease)   . H/O amenorrhea 01/27/04  . H/O varicella   . Hidradenitis 09/2014    bilateral axilla, left buttock, left groin  . Hypertension 06/07/11  . Hypertension    states under control with med., has been on med. x 6 yr.  . Irregular periods/menstrual cycles 05/22/04  . Multiple personality disorder   . Nasal congestion 09/27/2014  . Non-insulin dependent type 2 diabetes mellitus ([place])   . Obesity 06/07/11  . PCOS (polycystic ovarian syndrome) 06/07/11  . Schizophrenia ([place])   . Sleep apnea    does not use CPAP every night, per pt.  . Smoker 06/07/11  . Tachycardia    no cardiologist, per pt.    MEDICATIONS:  Prior to Admission medications   Medication Sig Start Date End Date Taking? Authorizing Provider  albuterol (PROVENTIL HFA;VENTOLIN HFA) 108 (90 BASE) MCG/ACT inhaler Inhale 2 puffs into the lungs every 6 (six) hours as needed for wheezing or shortness of breath. 01/01/14  Yes Ankit Kathrynn Humble, MD  albuterol (PROVENTIL) (2.5 MG/3ML) 0.083% nebulizer solution Take 2.5 mg by nebulization every 8 (eight) hours as needed. Shortness of breath   Yes Historical Provider, MD  ALPRAZolam (XANAX) 1 MG tablet Take 1 mg by mouth 3 (three) times daily.    Yes Historical Provider, MD  atenolol (TENORMIN) 50 MG tablet Take 50 mg by mouth every evening.  01/20/14  Yes Historical Provider, MD  canagliflozin (INVOKANA) 300 MG TABS tablet Take 300 mg by mouth 2 (two) times daily.   Yes Historical Provider, MD  diltiazem (DILACOR XR) 120 MG 24 hr capsule Take 120 mg  by mouth daily.   Yes Historical Provider, MD  Liraglutide (VICTOZA) 18 MG/3ML SOPN Inject 1.8 mg into the skin daily.    Yes Historical Provider, MD  omeprazole (PRILOSEC) 40 MG capsule Take 40 mg by mouth daily.   Yes Historical Provider, MD  doxycycline (VIBRAMYCIN) 100 MG capsule Take 1 capsule (100 mg total) by mouth 2 (two) times daily. Patient not taking: Reported on 10/18/2014 09/29/14   Elmyra Ricks Pisciotta, PA-C  fenofibrate (TRICOR) 145 MG tablet Take 145 mg by mouth every evening.     Historical Provider, MD   haloperidol (HALDOL) 0.5 MG tablet Take 0.5 mg by mouth every evening.     Historical Provider, MD  HYDROcodone-acetaminophen (NORCO/VICODIN) 5-325 MG tablet Take 2 tablets by mouth every 4 (four) hours as needed. 05/23/15   Fransico Meadow, PA-C  ibuprofen (ADVIL,MOTRIN) 600 MG tablet Take 1 tablet (600 mg total) by mouth every 8 (eight) hours as needed. Patient not taking: Reported on 12/17/2014 10/04/14   Jola Schmidt, MD  metFORMIN (GLUCOPHAGE) 1000 MG tablet Take 1,000 mg by mouth daily with breakfast.    Historical Provider, MD  mirtazapine (REMERON) 30 MG tablet Take 30 mg by mouth at bedtime.    Historical Provider, MD  NON FORMULARY cpap    Historical Provider, MD  Nystatin POWD 1 application by Does not apply route 2 (two) times daily. Patient not taking: Reported on 12/17/2014 10/18/14   Ruby Cola, MD  ondansetron (ZOFRAN) 4 MG tablet Take 1 tablet (4 mg total) by mouth every 6 (six) hours. 12/17/14   Stevi Barrett, PA-C  triamcinolone cream (KENALOG) 0.1 % Apply 1 application topically 2 (two) times daily. Patient not taking: Reported on 08/30/2014 08/21/14   Gregor Hams, MD    ALLERGIES:  Allergies  Allergen Reactions  . Fentanyl Palpitations and Shortness Of Breath    Pt states throat swells and heart races  . Penicillins Itching and Swelling    Facial swelling  . Triamterene Shortness Of Breath and Other (See Comments)    Blisters on stomach  . Naproxen Itching    SKIN BURNING  . Ultram [Tramadol] Hives    SOCIAL HISTORY:  Social History  Substance Use Topics  . Smoking status: Current Every Day Smoker    Packs/day: 0.50    Years: 8.00    Types: Cigarettes  . Smokeless tobacco: Never Used  . Alcohol use No    FAMILY HISTORY: Family History  Problem Relation Age of Onset  . Graves' disease Mother     EXAM: BP 112/65   Pulse 76   Temp 98 F (36.7 C)   Resp 20   Ht 5\' 9"  (1.753 m)   Wt 260 lb (117.9 kg)   LMP 09/03/2015   SpO2 96%   BMI 38.40 kg/m   CONSTITUTIONAL: Alert and oriented and responds appropriately to questions. Well-appearing; well-nourished, Obese, in no distress, afebrile HEAD: Normocephalic EYES: Conjunctivae clear, PERRL ENT: normal nose; no rhinorrhea; moist mucous membranes NECK: Supple, no meningismus, no LAD, trachea midline  CARD: RRR; S1 and S2 appreciated; no murmurs, no clicks, no rubs, no gallops CHEST:  Tender to palpation diffusely throughout her anterior chest wall which reproduces her chest pain. There is no crepitus, ecchymosis, deformity, rash or other lesions noted. RESP: Normal chest excursion without splinting or tachypnea; breath sounds clear and equal bilaterally; no wheezes, no rhonchi, no rales, no hypoxia or respiratory distress, speaking full sentences ABD/GI: Normal bowel sounds; non-distended; soft, mildly tender  to palpation throughout her abdomen without focality, no significant tenderness at McBurney's point, negative Murphy sign, no rebound, no guarding, no peritoneal signs BACK:  The back appears normal and is non-tender to palpation, there is no CVA tenderness EXT: Normal ROM in all joints; non-tender to palpation; no edema; normal capillary refill; no cyanosis, no calf tenderness or swelling    SKIN: Normal color for age and race; warm; no rash NEURO: Moves all extremities equally, sensation to light touch intact diffusely, cranial nerves II through XII intact PSYCH: The patient's mood and manner are appropriate. Grooming and personal hygiene are appropriate.  MEDICAL DECISION MAKING: Pt here with multiple complaints. Suspect that she has a viral gastroenteritis. Abdominal exam is relatively benign. Discussed with her that I do not think this is appendicitis, cholecystitis, pancreatitis, colitis, diverticulitis or bowel obstruction. I do not feel she needs emergent abdominal imaging and she agrees. She is not having any GU symptoms. Will check labs to see if there is any sign of DKA but urine  shows no ketones. As for her chest pain, I think this is chest wall pain. No risk factors for ACS, dissection, PE. EKG shows no ischemic abnormality. We'll obtain chest x-ray. We'll treat her symptoms with IV fluids, Zofran, morphine and reassess.  ED PROGRESS: Patient's labs unremarkable other than a bicarbonate of 17 which appears to be chronic for patient. She has a normal anion gap and again no ketones in her urine. Her blood glucose is 265. I do not believe this is DKA. We'll give her a second liter of IV fluids. She is still complaining of some mild pain to nursing staff. We'll give Toradol. We'll fluid challenge patient but anticipate discharge home with outpatient follow-up which she already has scheduled next week. Discussed with patient and her boyfriend at bedside and I suspect viral illness, chest wall pain. Discussed outpatient recommendations. They agree with this plan.   At this time, I do not feel there is any life-threatening condition present. I have reviewed and discussed all results (EKG, imaging, lab, urine as appropriate), exam findings with patient. I have reviewed nursing notes and appropriate previous records.  I feel the patient is safe to be discharged home without further emergent workup. Discussed usual and customary return precautions. Patient and family (if present) verbalize understanding and are comfortable with this plan.  Patient will follow-up with their primary care provider. If they do not have a primary care provider, information for follow-up has been provided to them. All questions have been answered.      EKG Interpretation  Date/Time:  Monday October 03 2015 02:12:03 EDT Ventricular Rate:  79 PR Interval:    QRS Duration: 109 QT Interval:  409 QTC Calculation: 469 R Axis:   56 Text Interpretation:  Sinus rhythm Baseline wander in lead(s) V3 V4 V5 No significant change since last tracing in 2005 Confirmed by Haelie Clapp,  DO, Drae Mitzel [phone]) on 10/03/2015 2:23:59  AM         [place], DO 10/03/15 [id number]

## 2015-10-03 NOTE — ED Triage Notes (Signed)
Pt c/o n/v/d, abd pain with bloating that started a week ago, pt also chest pain that started today,

## 2015-10-04 ENCOUNTER — Ambulatory Visit: Payer: Self-pay | Admitting: "Endocrinology

## 2015-10-19 ENCOUNTER — Emergency Department (HOSPITAL_COMMUNITY)
Admission: EM | Admit: 2015-10-19 | Discharge: 2015-10-20 | Disposition: A | Discharge status: Home / Self Care | Payer: Medicaid Other | Attending: Emergency Medicine | Admitting: Emergency Medicine

## 2015-10-19 ENCOUNTER — Encounter (HOSPITAL_COMMUNITY): Payer: Self-pay | Admitting: *Deleted

## 2015-10-19 DIAGNOSIS — E1165 Type 2 diabetes mellitus with hyperglycemia: Secondary | ICD-10-CM | POA: Insufficient documentation

## 2015-10-19 DIAGNOSIS — I10 Essential (primary) hypertension: Secondary | ICD-10-CM | POA: Insufficient documentation

## 2015-10-19 DIAGNOSIS — Z794 Long term (current) use of insulin: Secondary | ICD-10-CM | POA: Diagnosis not present

## 2015-10-19 DIAGNOSIS — Z79899 Other long term (current) drug therapy: Secondary | ICD-10-CM | POA: Insufficient documentation

## 2015-10-19 DIAGNOSIS — R739 Hyperglycemia, unspecified: Secondary | ICD-10-CM

## 2015-10-19 DIAGNOSIS — F17210 Nicotine dependence, cigarettes, uncomplicated: Secondary | ICD-10-CM | POA: Diagnosis not present

## 2015-10-19 DIAGNOSIS — J45909 Unspecified asthma, uncomplicated: Secondary | ICD-10-CM | POA: Diagnosis not present

## 2015-10-19 DIAGNOSIS — Z791 Long term (current) use of non-steroidal anti-inflammatories (NSAID): Secondary | ICD-10-CM | POA: Insufficient documentation

## 2015-10-19 DIAGNOSIS — B370 Candidal stomatitis: Secondary | ICD-10-CM | POA: Diagnosis not present

## 2015-10-19 DIAGNOSIS — K1379 Other lesions of oral mucosa: Secondary | ICD-10-CM | POA: Diagnosis present

## 2015-10-19 LAB — COMPREHENSIVE METABOLIC PANEL
ALBUMIN: 4.2 g/dL (ref 3.5–5.0)
ALT: 57 U/L — ABNORMAL HIGH (ref 14–54)
ANION GAP: 11 (ref 5–15)
AST: 61 U/L — ABNORMAL HIGH (ref 15–41)
Alkaline Phosphatase: 59 U/L (ref 38–126)
BILIRUBIN TOTAL: 0.7 mg/dL (ref 0.3–1.2)
BUN: 10 mg/dL (ref 6–20)
CHLORIDE: 108 mmol/L (ref 101–111)
CO2: 17 mmol/L — ABNORMAL LOW (ref 22–32)
Calcium: 9 mg/dL (ref 8.9–10.3)
Creatinine, Ser: 0.72 mg/dL (ref 0.44–1.00)
GFR calc Af Amer: >60 mL/min (ref >60)
GFR calc non Af Amer: >60 mL/min (ref >60)
GLUCOSE: 355 mg/dL — AB (ref 65–99)
POTASSIUM: 4.6 mmol/L (ref 3.5–5.1)
Sodium: 136 mmol/L (ref 135–145)
TOTAL PROTEIN: 7.3 g/dL (ref 6.5–8.1)

## 2015-10-19 LAB — CBC
HEMATOCRIT: 43 % (ref 36.0–46.0)
Hemoglobin: 15.9 g/dL — ABNORMAL HIGH (ref 12.0–15.0)
MCH: 31.9 pg (ref 26.0–34.0)
MCHC: 37 g/dL — AB (ref 30.0–36.0)
MCV: 86.3 fL (ref 78.0–100.0)
PLATELETS: 302 10*3/uL (ref 150–400)
RBC: 4.98 MIL/uL (ref 3.87–5.11)
RDW: 13.1 % (ref 11.5–15.5)
WBC: 7.6 10*3/uL (ref 4.0–10.5)

## 2015-10-19 LAB — URINALYSIS, ROUTINE W REFLEX MICROSCOPIC
Bilirubin Urine: NEGATIVE
Glucose, UA: >1000 mg/dL — AB
Hgb urine dipstick: NEGATIVE
KETONES UR: NEGATIVE mg/dL
LEUKOCYTES UA: NEGATIVE
NITRITE: NEGATIVE
PH: 5.5 (ref 5.0–8.0)
PROTEIN: NEGATIVE mg/dL
Specific Gravity, Urine: 1.01 (ref 1.005–1.030)

## 2015-10-19 LAB — URINE MICROSCOPIC-ADD ON: RBC / HPF: NONE SEEN RBC/hpf (ref 0–5)

## 2015-10-19 LAB — PREGNANCY, URINE: PREG TEST UR: NEGATIVE

## 2015-10-19 LAB — CBG MONITORING, ED
GLUCOSE-CAPILLARY: 357 mg/dL — AB (ref 65–99)
Glucose-Capillary: 378 mg/dL — ABNORMAL HIGH (ref 65–99)

## 2015-10-19 MED ORDER — SODIUM CHLORIDE 0.9 % IV BOLUS (SEPSIS)
1000.0000 mL | Freq: Once | INTRAVENOUS | Status: AC
  Administered 2015-10-19: 1000 mL via INTRAVENOUS

## 2015-10-19 MED ORDER — FLUCONAZOLE 100 MG PO TABS
100.0000 mg | ORAL_TABLET | Freq: Once | ORAL | Status: AC
  Administered 2015-10-20: 100 mg via ORAL
  Filled 2015-10-19: qty 1

## 2015-10-19 MED ORDER — INSULIN ASPART 100 UNIT/ML ~~LOC~~ SOLN
8.0000 [IU] | Freq: Once | SUBCUTANEOUS | Status: AC
  Administered 2015-10-19: 8 [IU] via SUBCUTANEOUS
  Filled 2015-10-19: qty 1

## 2015-10-19 MED ORDER — ONDANSETRON HCL 4 MG/2ML IJ SOLN
4.0000 mg | Freq: Once | INTRAMUSCULAR | Status: AC
  Administered 2015-10-19: 4 mg via INTRAVENOUS
  Filled 2015-10-19: qty 2

## 2015-10-19 NOTE — ED Notes (Signed)
Lab at bedside for repeat blood work,  

## 2015-10-19 NOTE — ED Notes (Signed)
MD Rancour notified of CBG, orders received.

## 2015-10-19 NOTE — ED Provider Notes (Signed)
Rebecca Moyer DEPT Provider Note   CSN: [id number] Arrival date & time: 10/19/15  2106  First Provider Contact:  First MD Initiated Contact with Patient 10/19/15 2254     By signing my name below, I, Rebecca Moyer, attest that this documentation has been prepared under the direction and in the presence of Ezequiel Essex, MD. Electronically Signed: Reola Moyer, ED Scribe. 10/19/15. 10:57 PM.  History   Chief Complaint Chief Complaint  Patient presents with  . Oral Pain   The history is provided by the patient. No language interpreter was used.   HPI Comments: Rebecca Moyer is a 30 y.o. female with a PMHx of GERD, DM, HTN, obesity, asthma, depression, and anxiety, who presents to the Emergency Department complaining of gradual onset, gradually worsening, constant, diffuse, burning oral pain and throat pain x ~7 days. She reports associated emesis x 4 episodes a day, diarrhea, and diffuse abdominal pain since the onset of her pain. Pt notes that she also has not been taking her DM medications recently because she broke her meter and has not been able to check her CBG. She reports that he pain is exacerbated with swallowing and with eating. States that she has been in DKA 11 times over the past year. No PSHx to the abdomen. No sick contact with similar symptoms. Denies fever, CP, back pain, loss of appetite, or any other associated symptoms.   PCP: York Ram, MD  Past Medical History:  Diagnosis Date  . Anxiety   . Arthritis    knees  . Asthma 06/07/11  . Asthma    prn inhaler  . Bipolar disorder ([place])   . Depression 06/07/11  . Depression   . Diabetes mellitus without complication ([place])   . Difficulty swallowing solids    due to GERD and slow motility of esophagus, per pt.  Marland Kitchen GERD (gastroesophageal reflux disease)   . H/O amenorrhea 01/27/04  . H/O varicella   . Hidradenitis 09/2014   bilateral axilla, left buttock, left groin  . Hypertension 06/07/11  .  Hypertension    states under control with med., has been on med. x 6 yr.  . Irregular periods/menstrual cycles 05/22/04  . Multiple personality disorder   . Nasal congestion 09/27/2014  . Non-insulin dependent type 2 diabetes mellitus ([place])   . Obesity 06/07/11  . PCOS (polycystic ovarian syndrome) 06/07/11  . Schizophrenia ([place])   . Sleep apnea    does not use CPAP every night, per pt.  . Smoker 06/07/11  . Tachycardia    no cardiologist, per pt.    Patient Active Problem List   Diagnosis Date Noted  . Hidradenitis 09/28/2014  . 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 DEPRESSIVE DISORDER, NOS 05/09/2006  . ATTENTION DEFICIT, W/HYPERACTIVITY 05/09/2006    Past Surgical History:  Procedure Laterality Date  . adnoids  06/07/11  . HYDRADENITIS EXCISION Bilateral 09/28/2014   Procedure: EXCISION HIDRADENITIS BILATERAL AXILLARY;  Surgeon: Armandina Gemma, MD;  Location: [facility];  Service: General;  Laterality: Bilateral;  . TONSILLECTOMY  06/07/11  . TONSILLECTOMY AND ADENOIDECTOMY  06/26/2000  . WISDOM TOOTH EXTRACTION  06/07/11  . WISDOM TOOTH EXTRACTION      OB History    Gravida Para Term Preterm AB Living   0 0 0 0 0     SAB TAB Ectopic Multiple Live Births   0 0 0           Home Medications    Prior  to Admission medications   Medication Sig Start Date End Date Taking? Authorizing Provider  albuterol (PROVENTIL HFA;VENTOLIN HFA) 108 (90 BASE) MCG/ACT inhaler Inhale 2 puffs into the lungs every 6 (six) hours as needed for wheezing or shortness of breath. 01/01/14  Yes Ankit Kathrynn Humble, MD  albuterol (PROVENTIL) (2.5 MG/3ML) 0.083% nebulizer solution Take 2.5 mg by nebulization every 8 (eight) hours as needed. Shortness of breath   Yes Historical Provider, MD  ALPRAZolam (XANAX) 1 MG tablet Take 1 mg by mouth 3 (three) times daily.    Yes Historical Provider, MD  atenolol (TENORMIN) 50 MG tablet Take 50 mg by mouth every evening.  01/20/14  Yes Historical Provider,  MD  canagliflozin (INVOKANA) 300 MG TABS tablet Take 300 mg by mouth 2 (two) times daily.   Yes Historical Provider, MD  diltiazem (DILACOR XR) 120 MG 24 hr capsule Take 120 mg by mouth every evening.    Yes Historical Provider, MD  escitalopram (LEXAPRO) 10 MG tablet Take 10 mg by mouth every morning. 10/07/15  Yes Historical Provider, MD  fenofibrate (TRICOR) 145 MG tablet Take 145 mg by mouth every evening.    Yes Historical Provider, MD  haloperidol (HALDOL) 10 MG tablet Take 10 mg by mouth daily.   Yes Historical Provider, MD  insulin glargine (LANTUS) 100 UNIT/ML injection Inject 20 Units into the skin at bedtime.   Yes Historical Provider, MD  Liraglutide (VICTOZA) 18 MG/3ML SOPN Inject 1.8 mg into the skin 2 (two) times daily.    Yes Historical Provider, MD  metFORMIN (GLUCOPHAGE) 1000 MG tablet Take 1,000 mg by mouth daily with breakfast.   Yes Historical Provider, MD  omeprazole (PRILOSEC) 40 MG capsule Take 40 mg by mouth 2 (two) times daily.    Yes Historical Provider, MD  oxyCODONE-acetaminophen (PERCOCET) 10-325 MG tablet Take 1 tablet by mouth every 8 (eight) hours as needed. 10/07/15  Yes Historical Provider, MD  temazepam (RESTORIL) 30 MG capsule Take 1 capsule by mouth at bedtime. 10/09/15  Yes Historical Provider, MD  ibuprofen (ADVIL,MOTRIN) 800 MG tablet Take 1 tablet (800 mg total) by mouth every 8 (eight) hours as needed for mild pain. Patient not taking: Reported on 10/19/2015 10/03/15   Delice Bison Ward, DO  loperamide (IMODIUM) 2 MG capsule Take 1 capsule (2 mg total) by mouth 4 (four) times daily as needed for diarrhea or loose stools. Patient not taking: Reported on 10/19/2015 10/03/15   Kristen N Ward, DO  NON FORMULARY cpap    Historical Provider, MD  ondansetron (ZOFRAN ODT) 4 MG disintegrating tablet Take 1 tablet (4 mg total) by mouth every 8 (eight) hours as needed for nausea or vomiting. Patient not taking: Reported on 10/19/2015 10/03/15   Delice Bison Ward, DO    Family  History Family History  Problem Relation Age of Onset  . Graves' disease Mother     Social History Social History  Substance Use Topics  . Smoking status: Current Every Day Smoker    Packs/day: 0.50    Years: 8.00    Types: Cigarettes  . Smokeless tobacco: Never Used  . Alcohol use No   Allergies   Fentanyl; Penicillins; Triamterene; Naproxen; and Ultram [tramadol]  Review of Systems Review of Systems A complete 10 system review of systems was obtained and all systems are negative except as noted in the HPI and PMH.   Physical Exam Updated Vital Signs BP 126/76   Pulse 95   Temp 98.3 F (36.8 C) (Oral)  Resp 22   Ht 5' 9.5" (1.765 m)   Wt 260 lb (117.9 kg)   LMP 09/26/2015   SpO2 99%   BMI 37.84 kg/m   Physical Exam  Constitutional: She is oriented to person, place, and time. She appears well-developed and well-nourished. No distress.  HENT:  Head: Normocephalic and atraumatic.  Thrush to tongue and hard palate. Floor of mouth is soft. No dental tenderness.   Eyes: Conjunctivae and EOM are normal. Pupils are equal, round, and reactive to light.  Neck: Normal range of motion. Neck supple.  No meningismus.  Cardiovascular: Normal rate, regular rhythm, normal heart sounds and intact distal pulses.   No murmur heard. Pulmonary/Chest: Effort normal and breath sounds normal. No respiratory distress. She has no wheezes. She has no rales. She exhibits no tenderness.  Abdominal: Soft. There is no tenderness. There is no rebound and no guarding.  Musculoskeletal: Normal range of motion. She exhibits no edema or tenderness.  Neurological: She is alert and oriented to person, place, and time. No cranial nerve deficit. She exhibits normal muscle tone. Coordination normal.  No ataxia on finger to nose bilaterally. No pronator drift. 5/5 strength throughout. CN 2-12 intact. Equal grip strength. Sensation intact.   Skin: Skin is warm.  Psychiatric: She has a normal mood and  affect. Her behavior is normal.  Nursing note and vitals reviewed.  ED Treatments / Results  DIAGNOSTIC STUDIES: Oxygen Saturation is 99% on RA, normal by my interpretation.   COORDINATION OF CARE: 10:57 PM-Discussed next steps with pt. Pt verbalized understanding and is agreeable with the plan.   Labs (all labs ordered are listed, but only abnormal results are displayed) Labs Reviewed  URINALYSIS, ROUTINE W REFLEX MICROSCOPIC (NOT AT [facility]) - Abnormal; Notable for the following:       Result Value   Glucose, UA >1000 (*)    All other components within normal limits  URINE MICROSCOPIC-ADD ON - Abnormal; Notable for the following:    Squamous Epithelial / LPF 0-5 (*)    Bacteria, UA RARE (*)    All other components within normal limits  CBG MONITORING, ED - Abnormal; Notable for the following:    Glucose-Capillary 357 (*)    All other components within normal limits  PREGNANCY, URINE  CBC  COMPREHENSIVE METABOLIC PANEL   Procedures Procedures (including critical care time)  Medications Ordered in ED Medications - No data to display  Initial Impression / Assessment and Plan / ED Course  I have reviewed the triage vital signs and the nursing notes.  Pertinent labs & imaging results that were available during my care of the patient were reviewed by me and considered in my medical decision making (see chart for details).  Clinical Course  Patient with 1 week history of burning in mouth and throat with white coating to tongue and palate. Exam consistent with thrush. Airway is maintained. Blood sugar elevated. Patient states he has not been checking her sugars but has been taking her medications.  Blood sugar improved to 292. Anion gap normal. Bicarb appears mildly low chronically. No ketones in UA. No evidence of DKA. We'll treat for thrush. Patient states she needs a new meter but has all of her other diabetic supplies at home. We'll give prescription for new meter. Return  precautions discussed. Final Clinical Impressions(s) / ED Diagnoses   Final diagnoses:  Thrush  Hyperglycemia    New Prescriptions New Prescriptions   No medications on file   I personally performed  the services described in this documentation, which was scribed in my presence. The recorded information has been reviewed and is accurate.     Ezequiel Essex, MD 10/20/15 [id number]

## 2015-10-19 NOTE — ED Triage Notes (Signed)
Pt c/o soreness to tongue area that started 3-4 days ago, pt also c/o being thirsty, states that she is not able to check her blood sugar at home due to her machine needing to be calibrated,

## 2015-10-19 NOTE — ED Notes (Signed)
Pt reports sore throat started 3 days ago, noticed white coating on tongue 2 days ago. Decreased fluid intake due to pain, has not been able to check CBG at home, reports it usually runs below 200 with medications.

## 2015-10-19 NOTE — ED Notes (Signed)
MD Rancour notified of CBG 357.

## 2015-10-20 LAB — BASIC METABOLIC PANEL
ANION GAP: 8 (ref 5–15)
BUN: 8 mg/dL (ref 6–20)
CHLORIDE: 109 mmol/L (ref 101–111)
CO2: 19 mmol/L — AB (ref 22–32)
Calcium: 8.4 mg/dL — ABNORMAL LOW (ref 8.9–10.3)
Creatinine, Ser: 0.6 mg/dL (ref 0.44–1.00)
GFR calc Af Amer: >60 mL/min (ref >60)
GLUCOSE: 292 mg/dL — AB (ref 65–99)
POTASSIUM: 4.3 mmol/L (ref 3.5–5.1)
Sodium: 136 mmol/L (ref 135–145)

## 2015-10-20 MED ORDER — FREESTYLE SYSTEM KIT
1.0000 | PACK | 0 refills | Status: AC | PRN
Start: 2015-10-20 — End: ?

## 2015-10-20 MED ORDER — NYSTATIN 100000 UNIT/ML MT SUSP: 500000.0000 [IU] | Freq: Four times a day (QID) | OROMUCOSAL | 0 refills | Status: DC

## 2015-11-09 ENCOUNTER — Encounter: Payer: Self-pay | Admitting: Neurology

## 2015-11-09 ENCOUNTER — Ambulatory Visit (INDEPENDENT_AMBULATORY_CARE_PROVIDER_SITE_OTHER): Payer: Medicaid Other | Admitting: Neurology

## 2015-11-09 VITALS — BP 127/76 | HR 81 | Ht 69.5 in | Wt 250.8 lb

## 2015-11-09 DIAGNOSIS — R569 Unspecified convulsions: Secondary | ICD-10-CM

## 2015-11-09 NOTE — Patient Instructions (Addendum)
Remember to drink plenty of fluid, eat healthy meals and do not skip any meals. Try to eat protein with a every meal and eat a healthy snack such as fruit or nuts in between meals. Try to keep a regular sleep-wake schedule and try to exercise daily, particularly in the form of walking, 20-30 minutes a day, if you can.   As far as diagnostic testing: MRI of the brain, EEG   Do not drive, operate heavy machinery, perform activities at heights or participate in water activities until 6 months seizure free  I would like to see you back in 3 months, sooner if we need to. Please call us with any interim questions, concerns, problems, updates or refill requests.   Our phone number is [phone]. We also have an after hours call service for urgent matters and there is a physician on-call for urgent questions. For any emergencies you know to call 911 or go to the nearest emergency room

## 2015-11-09 NOTE — Progress Notes (Signed)
[id number]    Provider:  Dr Jaynee Eagles Referring Provider: [name], MD Primary Care Physician:  [name], MD  CC:  Seizures  HPI:  Rebecca Moyer is a 30 y.o. female here as a referral from Dr. Jimmye Norman for seizures. PMHx of sleep apnea, schizophrenia, PCOS, multiple personality disorder, HTN, DM, depression, bipolar, anxiety, high cholesterol, migraine, COPD. Here with her fiance who provides most information. First episode 2-3 years ago without any inciting events or new medications or previous illnesses. The first one fiance found her on the floor, they were cleaning the house and she fell and her face was on the ground and he picked her up and she started convulsing, her head went back. Often she has arching of the back, arms are sometimes stiff, she has different kinds and sometimes she is "everywhere", stress triggers them, sometimes she just stares, she had a seizure in the car and after she was talking and didn;t know the year, one time she said 2007 and she didn;t know who her boyfriend was and she was acting like she was 89 years old she kept saying "i don't know you". These events happen at least 1-2x a week. They are often not the same, usually different in some way. Patient reports that she always urinates during episodes, completely empties her bladder. Her tongue was bleeding once. She went to [facility] and was told she was crazy and went to another emergency room and had the same response and so does not go any more. Patient's parents have seen these events as well.. Boyfriend says says she sometimes flails. One time she was arching her back like her back was going to break it and he held her head back and she started jerking. She will kick. Dad, mother, aunt with seizures. No drug use, no alcohol use, she denies any illicit or illegal drugs. Patient does not remember these events at all but does say occasionally she will get "glimpses " of what happened.  The seizures last 30 seconds to 2 minutes and the confusion afterwards can last hours. Eyes are closed during the episodes. No other focal neurologic deficits or complaints per patient and boyfriend. Nothing makes the episodes better. Stress can exacerbate or cause them but sometimes she is perfectly happy and there is no reason for the events.  Reviewed notes, labs and imaging from outside physicians, which showed:  BUN 8 and creatinine 0.6 on 10/19/2015.  Review of Systems: Patient complains of symptoms per HPI as well as the following symptoms: Fatigue, blurred vision, chest pain, palpitations, shortness of breath, wheezing, snoring, feeling hot, increased thirst, ringing in ears, spinning sensation, trouble swallowing, diarrhea, joint pain, cramps, aching muscles, moles fusion, headaches, weakness, slurred speech, difficulty swallowing, dizziness, seizure, tremor, insomnia, sleepiness, snoring, restless legs, depression, anxiety, too much sleep, not enough sleep, decreased energy, change in appetite, disinterest in activities and racing thoughts. Pertinent negatives per HPI. All others negative.   Social History   Social History  . Marital status: Single    Spouse name: [name]- fiance  . Number of children: 0  . Years of education: 9   Occupational History  . Disabled    Social History Main Topics  . Smoking status: Current Every Day Smoker    Packs/day: 0.50    Years: 8.00    Types: Cigarettes  . Smokeless tobacco: Never Used  . Alcohol use No  . Drug use: No  . Sexual activity: Yes  Birth control/ protection: None   Other Topics Concern  . Not on file   Social History Narrative   Lives with fiance   Caffeine use: none   ** Merged History Encounter **        Family History  Problem Relation Age of Onset  . Graves' disease Mother     Past Medical History:  Diagnosis Date  . Anxiety   . Arthritis    knees  . Asthma 06/07/11  . Asthma    prn inhaler  . Bipolar  disorder ([place])   . Depression 06/07/11  . Depression   . Diabetes mellitus without complication ([place])   . Difficulty swallowing solids    due to GERD and slow motility of esophagus, per pt.  Marland Kitchen GERD (gastroesophageal reflux disease)   . H/O amenorrhea 01/27/04  . H/O varicella   . Hidradenitis 09/2014   bilateral axilla, left buttock, left groin  . Hypertension 06/07/11  . Hypertension    states under control with med., has been on med. x 6 yr.  . Irregular periods/menstrual cycles 05/22/04  . Multiple personality disorder   . Nasal congestion 09/27/2014  . Non-insulin dependent type 2 diabetes mellitus ([place])   . Obesity 06/07/11  . PCOS (polycystic ovarian syndrome) 06/07/11  . Schizophrenia ([place])   . Sleep apnea    does not use CPAP every night, per pt.  . Smoker 06/07/11  . Tachycardia    no cardiologist, per pt.    Past Surgical History:  Procedure Laterality Date  . adnoids  06/07/11  . HYDRADENITIS EXCISION Bilateral 09/28/2014   Procedure: EXCISION HIDRADENITIS BILATERAL AXILLARY;  Surgeon: Armandina Gemma, MD;  Location: [facility];  Service: General;  Laterality: Bilateral;  . TONSILLECTOMY  06/07/11  . TONSILLECTOMY AND ADENOIDECTOMY  06/26/2000  . WISDOM TOOTH EXTRACTION  06/07/11  . WISDOM TOOTH EXTRACTION      Current Outpatient Prescriptions  Medication Sig Dispense Refill  . albuterol (PROVENTIL HFA;VENTOLIN HFA) 108 (90 BASE) MCG/ACT inhaler Inhale 2 puffs into the lungs every 6 (six) hours as needed for wheezing or shortness of breath. 1 Inhaler 2  . albuterol (PROVENTIL) (2.5 MG/3ML) 0.083% nebulizer solution Take 2.5 mg by nebulization every 8 (eight) hours as needed. Shortness of breath    . ALPRAZolam (XANAX) 1 MG tablet Take 1 mg by mouth 3 (three) times daily.     Marland Kitchen atenolol (TENORMIN) 50 MG tablet Take 50 mg by mouth every evening.   5  . canagliflozin (INVOKANA) 300 MG TABS tablet Take 300 mg by mouth 2 (two) times daily.    Marland Kitchen diltiazem (DILACOR XR)  120 MG 24 hr capsule Take 120 mg by mouth every evening.     . escitalopram (LEXAPRO) 10 MG tablet Take 10 mg by mouth every morning.  4  . fenofibrate (TRICOR) 145 MG tablet Take 145 mg by mouth every evening.     Marland Kitchen glucose monitoring kit (FREESTYLE) monitoring kit 1 each by Does not apply route as needed for other. 1 each 0  . haloperidol (HALDOL) 10 MG tablet Take 10 mg by mouth daily.    Marland Kitchen ibuprofen (ADVIL,MOTRIN) 800 MG tablet Take 1 tablet (800 mg total) by mouth every 8 (eight) hours as needed for mild pain. 30 tablet 0  . insulin glargine (LANTUS) 100 UNIT/ML injection Inject 20 Units into the skin at bedtime.    . Liraglutide (VICTOZA) 18 MG/3ML SOPN Inject 1.8 mg into the skin 2 (two) times daily.     Marland Kitchen  loperamide (IMODIUM) 2 MG capsule Take 1 capsule (2 mg total) by mouth 4 (four) times daily as needed for diarrhea or loose stools. 12 capsule 0  . NON FORMULARY cpap    . nystatin (MYCOSTATIN) 100000 UNIT/ML suspension Take 5 mLs (500,000 Units total) by mouth 4 (four) times daily. 60 mL 0  . omeprazole (PRILOSEC) 40 MG capsule Take 40 mg by mouth 2 (two) times daily.     . ondansetron (ZOFRAN ODT) 4 MG disintegrating tablet Take 1 tablet (4 mg total) by mouth every 8 (eight) hours as needed for nausea or vomiting. 20 tablet 0  . oxyCODONE-acetaminophen (PERCOCET) 10-325 MG tablet Take 1 tablet by mouth 4 (four) times daily.   0  . temazepam (RESTORIL) 30 MG capsule Take 1 capsule by mouth at bedtime.  0  . traZODone (DESYREL) 50 MG tablet Take 50 mg by mouth at bedtime.     No current facility-administered medications for this visit.     Allergies as of 11/09/2015 - Review Complete 11/09/2015  Allergen Reaction Noted  . Fentanyl Palpitations and Shortness Of Breath 01/01/2014  . Penicillins Itching and Swelling 07/07/2013  . Triamterene Shortness Of Breath and Other (See Comments) 09/27/2014  . Naproxen Itching 09/27/2014  . Ultram [tramadol] Hives 08/29/2013    Vitals: BP  127/76 (BP Location: Right Arm, Patient Position: Sitting, Cuff Size: Normal)   Pulse 81   Ht 5' 9.5" (1.765 m)   Wt 250 lb 12.8 oz (113.8 kg)   LMP 09/26/2015   BMI 36.51 kg/m  Last Weight:  Wt Readings from Last 1 Encounters:  11/09/15 250 lb 12.8 oz (113.8 kg)   Last Height:   Ht Readings from Last 1 Encounters:  11/09/15 5' 9.5" (1.765 m)   Physical exam: Exam: Gen: NAD, obese, poorly groomed                     CV: RRR, no MRG. No Carotid Bruits. No peripheral edema, warm, nontender Eyes: Conjunctivae clear without exudates or hemorrhage Skin: Patient has multiple skin lesions in the neck and axilla appear to be more skin tags less likely neurofibromas no other signs of this condition.  Neuro: Detailed Neurologic Exam  Speech:    Speech is normal; fluent and spontaneous with normal comprehension.  Cognition:    The patient is oriented to person, place, and time;     recent and remote memory intact;     language fluent;     normal attention, concentration,     fund of knowledge Cranial Nerves:    The pupils are equal, round, and reactive to light. The fundi are normal and spontaneous venous pulsations are present. Visual fields are full to finger confrontation. Extraocular movements are intact. Trigeminal sensation is intact and the muscles of mastication are normal. The face is symmetric. The palate elevates in the midline. Hearing intact. Voice is normal. Shoulder shrug is normal. The tongue has normal motion without fasciculations.   Coordination:    Normal finger to nose and heel to shin. Normal rapid alternating movements.   Gait:    Heel-toe and stride are normal. Mildly wide based due to very large body habitus.  Motor Observation:    No asymmetry, no atrophy, and no involuntary movements noted. Tone:    Normal muscle tone.    Posture:    Posture is normal. normal erect    Strength:    Strength is V/V in the upper and lower limbs.  Sensation: intact  to LT     Reflex Exam:  DTR's:    Deep tendon reflexes in the upper and lower extremities are normal bilaterally.   Toes:    The toes are downgoing bilaterally.   Clonus:    Clonus is absent.     Assessment/Plan:  30 year old with significant psychiatric history(bipolar, depression, anxiety, multiple personality disorder and schizophrenia, chronic pain ) and family Hx of seizures here with seizure-like events. 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 But she does have a significant family history of seizures and by patient report she urinates and bites her tongue so these need to be worked up. Often times seizures and pseudoseizures are comorbid.  MRI brain w/wo contrast EEG routine then 3-day EEG if necessary  Patient is unable to drive, operate heavy machinery, perform activities at heights or participate in water activities until 6 months seizure free  Discussed Patients with epilepsy have a small risk of sudden unexpected death, a condition referred to as sudden unexpected death in epilepsy (SUDEP). SUDEP is defined specifically as the sudden, unexpected, witnessed or unwitnessed, nontraumatic and nondrowning death in patients with epilepsy with or without evidence for a seizure, and excluding documented status epilepticus, in which post mortem examination does not reveal a structural or toxicologic cause for death   Cc: Dr. Dorcas Mcmurray, [facility] [address] [place], [place] [address]  Phone [phone] Fax [phone]

## 2015-12-06 ENCOUNTER — Other Ambulatory Visit: Payer: Self-pay

## 2015-12-13 ENCOUNTER — Other Ambulatory Visit: Payer: Self-pay

## 2015-12-15 ENCOUNTER — Institutional Professional Consult (permissible substitution): Payer: Self-pay | Admitting: Neurology

## 2015-12-15 ENCOUNTER — Other Ambulatory Visit: Payer: Self-pay | Admitting: Women's Health

## 2015-12-15 ENCOUNTER — Telehealth: Payer: Self-pay

## 2015-12-15 NOTE — Telephone Encounter (Signed)
Pt did not show for their appt with Dr. Dohmeier today.  

## 2015-12-26 ENCOUNTER — Encounter: Payer: Self-pay | Admitting: *Deleted

## 2015-12-26 ENCOUNTER — Other Ambulatory Visit: Payer: Self-pay | Admitting: Women's Health

## 2015-12-26 ENCOUNTER — Institutional Professional Consult (permissible substitution): Payer: Self-pay | Admitting: Neurology

## 2015-12-29 ENCOUNTER — Institutional Professional Consult (permissible substitution): Payer: Self-pay | Admitting: Neurology

## 2016-01-17 ENCOUNTER — Institutional Professional Consult (permissible substitution): Payer: Self-pay | Admitting: Neurology

## 2016-02-13 ENCOUNTER — Telehealth: Payer: Self-pay | Admitting: *Deleted

## 2016-02-13 ENCOUNTER — Ambulatory Visit: Payer: Self-pay | Admitting: Neurology

## 2016-02-13 NOTE — Telephone Encounter (Signed)
No showed f/u 

## 2016-02-16 ENCOUNTER — Encounter: Payer: Self-pay | Admitting: Neurology
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
## 2016-03-24 ENCOUNTER — Emergency Department (HOSPITAL_COMMUNITY)
Admission: EM | Admit: 2016-03-24 | Discharge: 2016-03-24 | Disposition: A | Discharge status: Home / Self Care | Payer: Medicaid Other | Attending: Dermatology | Admitting: Dermatology

## 2016-03-24 ENCOUNTER — Encounter (HOSPITAL_COMMUNITY): Payer: Self-pay | Admitting: Emergency Medicine

## 2016-03-24 DIAGNOSIS — J45909 Unspecified asthma, uncomplicated: Secondary | ICD-10-CM | POA: Insufficient documentation

## 2016-03-24 DIAGNOSIS — Z794 Long term (current) use of insulin: Secondary | ICD-10-CM | POA: Diagnosis not present

## 2016-03-24 DIAGNOSIS — I10 Essential (primary) hypertension: Secondary | ICD-10-CM | POA: Insufficient documentation

## 2016-03-24 DIAGNOSIS — E119 Type 2 diabetes mellitus without complications: Secondary | ICD-10-CM | POA: Insufficient documentation

## 2016-03-24 DIAGNOSIS — R200 Anesthesia of skin: Secondary | ICD-10-CM | POA: Diagnosis not present

## 2016-03-24 DIAGNOSIS — F17210 Nicotine dependence, cigarettes, uncomplicated: Secondary | ICD-10-CM | POA: Diagnosis not present

## 2016-03-24 DIAGNOSIS — Z79899 Other long term (current) drug therapy: Secondary | ICD-10-CM | POA: Diagnosis not present

## 2016-03-24 DIAGNOSIS — R202 Paresthesia of skin: Secondary | ICD-10-CM | POA: Insufficient documentation

## 2016-03-24 DIAGNOSIS — Z5321 Procedure and treatment not carried out due to patient leaving prior to being seen by health care provider: Secondary | ICD-10-CM | POA: Insufficient documentation

## 2016-03-24 NOTE — ED Notes (Signed)
Unable to locate patient for [facility] at this time. Both restrooms in fast track checked, checked with triage and registation who states they're unsure if patient left.

## 2016-03-24 NOTE — ED Notes (Signed)
After numerous attempts by myself and Almyra Free, [place] patient was never located. Dismissing from computer now has "left after triage" as patient was never seen/assessed by PA

## 2016-03-24 NOTE — ED Triage Notes (Signed)
Pt states that 3-4 days ago she started experiencing numbness and tingling to the tips of both hands and now it has spread to her hands.

## 2016-04-16 ENCOUNTER — Other Ambulatory Visit: Payer: Self-pay | Admitting: Adult Health

## 2016-04-16 ENCOUNTER — Encounter: Payer: Self-pay | Admitting: *Deleted

## 2016-04-19 ENCOUNTER — Emergency Department (HOSPITAL_COMMUNITY): Payer: Medicaid Other

## 2016-04-19 ENCOUNTER — Encounter (HOSPITAL_COMMUNITY): Payer: Self-pay

## 2016-04-19 ENCOUNTER — Emergency Department (HOSPITAL_COMMUNITY)
Admission: EM | Admit: 2016-04-19 | Discharge: 2016-04-19 | Disposition: A | Discharge status: Home / Self Care | Payer: Medicaid Other | Attending: Emergency Medicine | Admitting: Emergency Medicine

## 2016-04-19 DIAGNOSIS — Z794 Long term (current) use of insulin: Secondary | ICD-10-CM | POA: Insufficient documentation

## 2016-04-19 DIAGNOSIS — J45909 Unspecified asthma, uncomplicated: Secondary | ICD-10-CM | POA: Insufficient documentation

## 2016-04-19 DIAGNOSIS — Y939 Activity, unspecified: Secondary | ICD-10-CM | POA: Insufficient documentation

## 2016-04-19 DIAGNOSIS — W540XXA Bitten by dog, initial encounter: Secondary | ICD-10-CM | POA: Diagnosis not present

## 2016-04-19 DIAGNOSIS — Z79899 Other long term (current) drug therapy: Secondary | ICD-10-CM | POA: Insufficient documentation

## 2016-04-19 DIAGNOSIS — E119 Type 2 diabetes mellitus without complications: Secondary | ICD-10-CM | POA: Diagnosis not present

## 2016-04-19 DIAGNOSIS — S61452A Open bite of left hand, initial encounter: Secondary | ICD-10-CM | POA: Diagnosis present

## 2016-04-19 DIAGNOSIS — F17210 Nicotine dependence, cigarettes, uncomplicated: Secondary | ICD-10-CM | POA: Insufficient documentation

## 2016-04-19 DIAGNOSIS — Y92009 Unspecified place in unspecified non-institutional (private) residence as the place of occurrence of the external cause: Secondary | ICD-10-CM | POA: Insufficient documentation

## 2016-04-19 DIAGNOSIS — Y999 Unspecified external cause status: Secondary | ICD-10-CM | POA: Diagnosis not present

## 2016-04-19 DIAGNOSIS — I10 Essential (primary) hypertension: Secondary | ICD-10-CM | POA: Diagnosis not present

## 2016-04-19 MED ORDER — DOXYCYCLINE HYCLATE 100 MG PO CAPS: 100.0000 mg | ORAL_CAPSULE | Freq: Two times a day (BID) | ORAL | 0 refills | Status: DC

## 2016-04-19 MED ORDER — LIDOCAINE HCL (PF) 1 % IJ SOLN
10.0000 mL | Freq: Once | INTRAMUSCULAR | Status: DC
  Filled 2016-04-19: qty 30

## 2016-04-19 MED ORDER — HYDROCODONE-ACETAMINOPHEN 5-325 MG PO TABS: 1.0000 | ORAL_TABLET | Freq: Four times a day (QID) | ORAL | 0 refills | Status: DC | PRN

## 2016-04-19 MED ORDER — IBUPROFEN 600 MG PO TABS: 600.0000 mg | ORAL_TABLET | Freq: Four times a day (QID) | ORAL | 0 refills | Status: DC | PRN

## 2016-04-19 MED ORDER — HYDROCODONE-ACETAMINOPHEN 5-325 MG PO TABS
2.0000 | ORAL_TABLET | Freq: Once | ORAL | Status: AC
  Administered 2016-04-19: 2 via ORAL
  Filled 2016-04-19: qty 2

## 2016-04-19 NOTE — ED Notes (Signed)
Patient was alert, oriented and stable upon discharge. RN went over AVS and patient had no further questions.  Patient was instructed not to drive or operate heavy machinery on narcotic pain medication.   

## 2016-04-19 NOTE — ED Provider Notes (Signed)
[facility] DEPT Provider Note   CSN: [id number] Arrival date & time: 04/19/16  1812  By signing my name below, I, Rebecca Moyer, attest that this documentation has been prepared under the direction and in the presence of non-physician practitioner, Armstead Peaks, PA-C. Electronically Signed: Jeanell Moyer, Scribe. 04/19/2016. 7:52 PM.  History   Chief Complaint Chief Complaint  Patient presents with  . Animal Bite   The history is provided by the patient. No language interpreter was used.   HPI Comments: Rebecca Moyer is a 31 y.o. female with a PMHx of DM who presents to the Emergency Department complaining of left hand dog bite that occurred about 2 hours ago. She was at her neighbor's house when she was bitten by their dog. Immunizations of neighbor's dog are UTD. She wrapped and applied pressure to area without relief. No medication PTA. She has associated constant moderate left hand pain and minor wounds to right middle finger. Her tetanus is UTD. She has a hives allergic reaction to penicillins. She denies any other complaints.     PCP: [name], MD  Past Medical History:  Diagnosis Date  . Anxiety   . Arthritis    knees  . Asthma 06/07/11  . Asthma    prn inhaler  . Bipolar disorder ([place])   . Depression 06/07/11  . Depression   . Diabetes mellitus without complication ([place])   . Difficulty swallowing solids    due to GERD and slow motility of esophagus, per pt.  Marland Kitchen GERD (gastroesophageal reflux disease)   . H/O amenorrhea 01/27/04  . H/O varicella   . Hidradenitis 09/2014   bilateral axilla, left buttock, left groin  . Hypertension 06/07/11  . Hypertension    states under control with med., has been on med. x 6 yr.  . Irregular periods/menstrual cycles 05/22/04  . Multiple personality disorder   . Nasal congestion 09/27/2014  . Non-insulin dependent type 2 diabetes mellitus ([place])   . Obesity 06/07/11  . PCOS (polycystic ovarian syndrome) 06/07/11  . Schizophrenia  ([place])   . Sleep apnea    does not use CPAP every night, per pt.  . Smoker 06/07/11  . Tachycardia    no cardiologist, per pt.    Patient Active Problem List   Diagnosis Date Noted  . Hidradenitis 09/28/2014  . 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 DEPRESSIVE DISORDER, NOS 05/09/2006  . ATTENTION DEFICIT, W/HYPERACTIVITY 05/09/2006    Past Surgical History:  Procedure Laterality Date  . adnoids  06/07/11  . HYDRADENITIS EXCISION Bilateral 09/28/2014   Procedure: EXCISION HIDRADENITIS BILATERAL AXILLARY;  Surgeon: Armandina Gemma, MD;  Location: [facility];  Service: General;  Laterality: Bilateral;  . TONSILLECTOMY  06/07/11  . TONSILLECTOMY AND ADENOIDECTOMY  06/26/2000  . WISDOM TOOTH EXTRACTION  06/07/11  . WISDOM TOOTH EXTRACTION      OB History    Gravida Para Term Preterm AB Living   0 0 0 0 0     SAB TAB Ectopic Multiple Live Births   0 0 0           Home Medications    Prior to Admission medications   Medication Sig Start Date End Date Taking? Authorizing Provider  albuterol (PROVENTIL HFA;VENTOLIN HFA) 108 (90 BASE) MCG/ACT inhaler Inhale 2 puffs into the lungs every 6 (six) hours as needed for wheezing or shortness of breath. 01/01/14   Varney Biles, MD  albuterol (PROVENTIL) (2.5 MG/3ML) 0.083% nebulizer solution Take 2.5 mg  by nebulization every 8 (eight) hours as needed. Shortness of breath    Historical Provider, MD  ALPRAZolam Duanne Moron) 1 MG tablet Take 1 mg by mouth 3 (three) times daily.     Historical Provider, MD  atenolol (TENORMIN) 50 MG tablet Take 50 mg by mouth every evening.  01/20/14   Historical Provider, MD  canagliflozin (INVOKANA) 300 MG TABS tablet Take 300 mg by mouth 2 (two) times daily.    Historical Provider, MD  diltiazem (CARDIZEM CD) 120 MG 24 hr capsule Take 1 capsule by mouth daily. 03/13/16   Historical Provider, MD  diltiazem (DILACOR XR) 120 MG 24 hr capsule Take 120 mg by mouth every evening.     Historical Provider, MD    doxycycline (VIBRAMYCIN) 100 MG capsule Take 1 capsule (100 mg total) by mouth 2 (two) times daily. 04/19/16   Frederica Kuster, PA-C  escitalopram (LEXAPRO) 10 MG tablet Take 10 mg by mouth every morning. 10/07/15   Historical Provider, MD  fenofibrate (TRICOR) 145 MG tablet Take 145 mg by mouth every evening.     Historical Provider, MD  glucose monitoring kit (FREESTYLE) monitoring kit 1 each by Does not apply route as needed for other. 10/20/15   Ezequiel Essex, MD  haloperidol (HALDOL) 10 MG tablet Take 10 mg by mouth daily.    Historical Provider, MD  hydrOXYzine (VISTARIL) 50 MG capsule Take 1 capsule by mouth 3 (three) times daily as needed. itching 02/04/16   Historical Provider, MD  ibuprofen (ADVIL,MOTRIN) 600 MG tablet Take 1 tablet (600 mg total) by mouth every 6 (six) hours as needed. 04/19/16   Frederica Kuster, PA-C  insulin glargine (LANTUS) 100 UNIT/ML injection Inject 20 Units into the skin at bedtime.    Historical Provider, MD  Liraglutide (VICTOZA) 18 MG/3ML SOPN Inject 1.8 mg into the skin 2 (two) times daily.     Historical Provider, MD  loperamide (IMODIUM) 2 MG capsule Take 1 capsule (2 mg total) by mouth 4 (four) times daily as needed for diarrhea or loose stools. 10/03/15   Kristen N Ward, DO  neomycin-polymyxin b-dexamethasone (MAXITROL) 3.5-10000-0.1 OINT Place 1 application into the left eye 4 (four) times daily. 03/13/16   Historical Provider, MD  NON FORMULARY cpap    Historical Provider, MD  nystatin (MYCOSTATIN) 100000 UNIT/ML suspension Take 5 mLs (500,000 Units total) by mouth 4 (four) times daily. 10/20/15   Ezequiel Essex, MD  omeprazole (PRILOSEC) 40 MG capsule Take 40 mg by mouth 2 (two) times daily.     Historical Provider, MD  ondansetron (ZOFRAN ODT) 4 MG disintegrating tablet Take 1 tablet (4 mg total) by mouth every 8 (eight) hours as needed for nausea or vomiting. 10/03/15   Kristen N Ward, DO  oxyCODONE-acetaminophen (PERCOCET) 10-325 MG tablet Take 1 tablet by  mouth 4 (four) times daily.  10/07/15   Historical Provider, MD  temazepam (RESTORIL) 30 MG capsule Take 1 capsule by mouth at bedtime. 10/09/15   Historical Provider, MD  traZODone (DESYREL) 50 MG tablet Take 50 mg by mouth at bedtime.    Historical Provider, MD    Family History Family History  Problem Relation Age of Onset  . Graves' disease Mother     Social History Social History  Substance Use Topics  . Smoking status: Current Every Day Smoker    Packs/day: 0.50    Years: 8.00    Types: Cigarettes  . Smokeless tobacco: Never Used  . Alcohol use No  Allergies   Fentanyl; Penicillins; Triamterene; Naproxen; and Ultram [tramadol]   Review of Systems Review of Systems  Constitutional: Negative for chills and fever.  HENT: Negative for facial swelling and sore throat.   Respiratory: Negative for shortness of breath.   Cardiovascular: Negative for chest pain.  Gastrointestinal: Negative for abdominal pain, nausea and vomiting.  Genitourinary: Negative for dysuria.  Musculoskeletal: Positive for myalgias. Negative for back pain.  Skin: Positive for wound (Left hand). Negative for rash.  Neurological: Negative for headaches.  Psychiatric/Behavioral: The patient is not nervous/anxious.      Physical Exam Updated Vital Signs BP 129/80 (BP Location: Right Arm)   Pulse 100   Temp 98.4 F (36.9 C) (Oral)   Resp 18   Ht 5' 9.5" (1.765 m)   Wt 254 lb 14.4 oz (115.6 kg)   SpO2 98%   BMI 37.10 kg/m   Physical Exam  Constitutional: She appears well-developed and well-nourished. No distress.  HENT:  Head: Normocephalic and atraumatic.  Mouth/Throat: Oropharynx is clear and moist. No oropharyngeal exudate.  Eyes: Conjunctivae are normal. Pupils are equal, round, and reactive to light. Right eye exhibits no discharge. Left eye exhibits no discharge. No scleral icterus.  Neck: Normal range of motion. Neck supple. No thyromegaly present.  Cardiovascular: Normal rate,  regular rhythm, normal heart sounds and intact distal pulses.  Exam reveals no gallop and no friction rub.   No murmur heard. Pulmonary/Chest: Effort normal and breath sounds normal. No stridor. No respiratory distress. She has no wheezes. She has no rales.  Abdominal: Soft. Bowel sounds are normal. She exhibits no distension. There is no tenderness. There is no rebound and no guarding.  Musculoskeletal: She exhibits no edema.  Lymphadenopathy:    She has no cervical adenopathy.  Neurological: She is alert. Coordination normal.  Skin: Skin is warm and dry. No rash noted. She is not diaphoretic. No pallor.  Wounds to L palm a R 3rd digit (see photo)  Psychiatric: She has a normal mood and affect.  Nursing note and vitals reviewed.        ED Treatments / Results  DIAGNOSTIC STUDIES: Oxygen Saturation is 98% on RA, normal by my interpretation.    COORDINATION OF CARE: 7:56 PM- Pt advised of plan for treatment and pt agrees.  Labs (all labs ordered are listed, but only abnormal results are displayed) Labs Reviewed - No data to display  EKG  EKG Interpretation None       Radiology No results found.  Procedures Procedures (including critical care time)  Medications Ordered in ED Medications  HYDROcodone-acetaminophen (NORCO/VICODIN) 5-325 MG per tablet 2 tablet (2 tablets Oral Given 04/19/16 2134)     Initial Impression / Assessment and Plan / ED Course  I have reviewed the triage vital signs and the nursing notes.  Pertinent labs & imaging results that were available during my care of the patient were reviewed by me and considered in my medical decision making (see chart for details).     Patient with dog bite to bilateral hands. X-rays negative. Wounds soaked with this normal saline with small amount of Betadine. Wounds then irrigated with jet lavage. Patient offered and declined local anesthetic. Wound care provided in the ED and discussed for home. Patient given  doxycycline at discharge. Wound check in 2 days at PCP or ED. Strict return precautions given. Patient understands and agrees with plan. Patient vitals stable throughout ED course and discharged in satisfactory condition.  Final Clinical Impressions(s) /  ED Diagnoses   Final diagnoses:  Dog bite, initial encounter    New Prescriptions Discharge Medication List as of 04/19/2016  9:51 PM     I personally performed the services described in this documentation, which was scribed in my presence. The recorded information has been reviewed and is accurate.     Frederica Kuster, PA-C 04/23/16 0109    Orlie Dakin, MD 04/23/16 1212

## 2016-04-19 NOTE — Discharge Instructions (Signed)
Medications: Doxyclycline, ibuprofen  Treatment: Take doxycycline twice daily until finished. Take ibuprofen every 6 hours as prescribed as needed for pain. You can alternate with Tylenol as prescribed over the counter.  Follow-up: Please return or see your PCP in 2 days for wound recheck. Please return sooner if you develop and fevers, increasing pain, redness, swelling, drainage, or streaking up your arm.

## 2016-04-19 NOTE — ED Triage Notes (Signed)
Per pt, bitten in left hand by dog.  Shots are recorded for dog

## 2016-05-16 ENCOUNTER — Encounter (HOSPITAL_COMMUNITY): Payer: Self-pay | Admitting: Emergency Medicine

## 2016-05-16 ENCOUNTER — Emergency Department (HOSPITAL_COMMUNITY): Payer: Medicaid Other

## 2016-05-16 DIAGNOSIS — F17210 Nicotine dependence, cigarettes, uncomplicated: Secondary | ICD-10-CM | POA: Diagnosis not present

## 2016-05-16 DIAGNOSIS — F909 Attention-deficit hyperactivity disorder, unspecified type: Secondary | ICD-10-CM | POA: Diagnosis not present

## 2016-05-16 DIAGNOSIS — E119 Type 2 diabetes mellitus without complications: Secondary | ICD-10-CM | POA: Insufficient documentation

## 2016-05-16 DIAGNOSIS — Z8544 Personal history of malignant neoplasm of other female genital organs: Secondary | ICD-10-CM | POA: Insufficient documentation

## 2016-05-16 DIAGNOSIS — Z794 Long term (current) use of insulin: Secondary | ICD-10-CM | POA: Diagnosis not present

## 2016-05-16 DIAGNOSIS — J45909 Unspecified asthma, uncomplicated: Secondary | ICD-10-CM | POA: Diagnosis not present

## 2016-05-16 DIAGNOSIS — I10 Essential (primary) hypertension: Secondary | ICD-10-CM | POA: Insufficient documentation

## 2016-05-16 DIAGNOSIS — R079 Chest pain, unspecified: Secondary | ICD-10-CM | POA: Diagnosis present

## 2016-05-16 DIAGNOSIS — R0789 Other chest pain: Secondary | ICD-10-CM | POA: Diagnosis not present

## 2016-05-16 LAB — CBC WITH DIFFERENTIAL/PLATELET
Basophils Absolute: 0 10*3/uL (ref 0.0–0.1)
Basophils Relative: 0 %
EOS ABS: 0.3 10*3/uL (ref 0.0–0.7)
EOS PCT: 3 %
HCT: 48.1 % — ABNORMAL HIGH (ref 36.0–46.0)
Hemoglobin: 17.7 g/dL — ABNORMAL HIGH (ref 12.0–15.0)
LYMPHS ABS: 3.3 10*3/uL (ref 0.7–4.0)
Lymphocytes Relative: 30 %
MCH: 32.3 pg (ref 26.0–34.0)
MCHC: 36.8 g/dL — ABNORMAL HIGH (ref 30.0–36.0)
MCV: 87.8 fL (ref 78.0–100.0)
Monocytes Absolute: 0.3 10*3/uL (ref 0.1–1.0)
Monocytes Relative: 3 %
Neutro Abs: 7.3 10*3/uL (ref 1.7–7.7)
Neutrophils Relative %: 65 %
PLATELETS: 376 10*3/uL (ref 150–400)
RBC: 5.48 MIL/uL — AB (ref 3.87–5.11)
RDW: 13.3 % (ref 11.5–15.5)
WBC: 11.2 10*3/uL — AB (ref 4.0–10.5)

## 2016-05-16 NOTE — ED Triage Notes (Signed)
Pt c/o left chest pain radiating through to back x's 3-4 days   St's pain worse with deep breathing.  Also c/o cough.and shortness of breath

## 2016-05-17 ENCOUNTER — Emergency Department (HOSPITAL_COMMUNITY)
Admission: EM | Admit: 2016-05-17 | Discharge: 2016-05-17 | Disposition: A | Discharge status: Home / Self Care | Payer: Medicaid Other | Attending: Emergency Medicine | Admitting: Emergency Medicine

## 2016-05-17 DIAGNOSIS — R0789 Other chest pain: Secondary | ICD-10-CM

## 2016-05-17 LAB — COMPREHENSIVE METABOLIC PANEL
ALBUMIN: 4.4 g/dL (ref 3.5–5.0)
ALK PHOS: 55 U/L (ref 38–126)
Anion gap: 16 — ABNORMAL HIGH (ref 5–15)
BILIRUBIN TOTAL: 2.7 mg/dL — AB (ref 0.3–1.2)
BUN: 10 mg/dL (ref 6–20)
CALCIUM: 10.1 mg/dL (ref 8.9–10.3)
CO2: 16 mmol/L — ABNORMAL LOW (ref 22–32)
Chloride: 106 mmol/L (ref 101–111)
Creatinine, Ser: 0.88 mg/dL (ref 0.44–1.00)
GFR calc Af Amer: >60 mL/min (ref >60)
GFR calc non Af Amer: >60 mL/min (ref >60)
GLUCOSE: 448 mg/dL — AB (ref 65–99)
POTASSIUM: 6.8 mmol/L — AB (ref 3.5–5.1)
Sodium: 138 mmol/L (ref 135–145)

## 2016-05-17 NOTE — ED Provider Notes (Signed)
[facility] DEPT Provider Note   CSN: [id number] Arrival date & time: 05/16/16  2231  By signing my name below, I, Oleh Genin, attest that this documentation has been prepared under the direction and in the presence of Everlene Balls, MD. Electronically Signed: Oleh Genin, Scribe. 05/17/16. 3:56 AM.   History   Chief Complaint Chief Complaint  Patient presents with  . Chest Pain    HPI Rebecca Moyer is a 31 y.o. female with history of schizophrenia, COPD, HTN, and DM with previous admissions for DKA who presents to the ED for evaluation of chest pain. This patient states that 4-5 days ago she developed L sided chest pain which radiates into the back. Her symptoms have been intermittent over this time period. No particular inciting factors. She is also reporting painful respirations, cough with yellow-green sputum, rhinorrhea, and chills. No fevers. No sick contact. She is having occasional palpitations without associated pain. She is reporting occasional vomiting and diarrhea which are an ongoing problem for her. No recent travel. No history of DVT/PE. No recent surgeries. She is not taking exogenous hormonal therapies.   The history is provided by the patient. No language interpreter was used.    Past Medical History:  Diagnosis Date  . Anxiety   . Arthritis    knees  . Asthma 06/07/11  . Asthma    prn inhaler  . Bipolar disorder ([place])   . Depression 06/07/11  . Depression   . Diabetes mellitus without complication ([place])   . Difficulty swallowing solids    due to GERD and slow motility of esophagus, per pt.  Marland Kitchen GERD (gastroesophageal reflux disease)   . H/O amenorrhea 01/27/04  . H/O varicella   . Hidradenitis 09/2014   bilateral axilla, left buttock, left groin  . Hypertension 06/07/11  . Hypertension    states under control with med., has been on med. x 6 yr.  . Irregular periods/menstrual cycles 05/22/04  . Multiple personality disorder   . Nasal congestion  09/27/2014  . Non-insulin dependent type 2 diabetes mellitus ([place])   . Obesity 06/07/11  . PCOS (polycystic ovarian syndrome) 06/07/11  . Schizophrenia ([place])   . Sleep apnea    does not use CPAP every night, per pt.  . Smoker 06/07/11  . Tachycardia    no cardiologist, per pt.    Patient Active Problem List   Diagnosis Date Noted  . Hidradenitis 09/28/2014  . 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 DEPRESSIVE DISORDER, NOS 05/09/2006  . ATTENTION DEFICIT, W/HYPERACTIVITY 05/09/2006    Past Surgical History:  Procedure Laterality Date  . adnoids  06/07/11  . HYDRADENITIS EXCISION Bilateral 09/28/2014   Procedure: EXCISION HIDRADENITIS BILATERAL AXILLARY;  Surgeon: Armandina Gemma, MD;  Location: [facility];  Service: General;  Laterality: Bilateral;  . TONSILLECTOMY  06/07/11  . TONSILLECTOMY AND ADENOIDECTOMY  06/26/2000  . WISDOM TOOTH EXTRACTION  06/07/11  . WISDOM TOOTH EXTRACTION      OB History    Gravida Para Term Preterm AB Living   0 0 0 0 0     SAB TAB Ectopic Multiple Live Births   0 0 0           Home Medications    Prior to Admission medications   Medication Sig Start Date End Date Taking? Authorizing Provider  albuterol (PROVENTIL HFA;VENTOLIN HFA) 108 (90 BASE) MCG/ACT inhaler Inhale 2 puffs into the lungs every 6 (six) hours as needed for wheezing or shortness of breath.  01/01/14   Varney Biles, MD  albuterol (PROVENTIL) (2.5 MG/3ML) 0.083% nebulizer solution Take 2.5 mg by nebulization every 8 (eight) hours as needed. Shortness of breath    Historical Provider, MD  ALPRAZolam Duanne Moron) 1 MG tablet Take 1 mg by mouth 3 (three) times daily.     Historical Provider, MD  atenolol (TENORMIN) 50 MG tablet Take 50 mg by mouth every evening.  01/20/14   Historical Provider, MD  canagliflozin (INVOKANA) 300 MG TABS tablet Take 300 mg by mouth 2 (two) times daily.    Historical Provider, MD  diltiazem (CARDIZEM CD) 120 MG 24 hr capsule Take 1 capsule by mouth  daily. 03/13/16   Historical Provider, MD  diltiazem (DILACOR XR) 120 MG 24 hr capsule Take 120 mg by mouth every evening.     Historical Provider, MD  doxycycline (VIBRAMYCIN) 100 MG capsule Take 1 capsule (100 mg total) by mouth 2 (two) times daily. 04/19/16   Frederica Kuster, PA-C  escitalopram (LEXAPRO) 10 MG tablet Take 10 mg by mouth every morning. 10/07/15   Historical Provider, MD  fenofibrate (TRICOR) 145 MG tablet Take 145 mg by mouth every evening.     Historical Provider, MD  glucose monitoring kit (FREESTYLE) monitoring kit 1 each by Does not apply route as needed for other. 10/20/15   Ezequiel Essex, MD  haloperidol (HALDOL) 10 MG tablet Take 10 mg by mouth daily.    Historical Provider, MD  hydrOXYzine (VISTARIL) 50 MG capsule Take 1 capsule by mouth 3 (three) times daily as needed. itching 02/04/16   Historical Provider, MD  ibuprofen (ADVIL,MOTRIN) 600 MG tablet Take 1 tablet (600 mg total) by mouth every 6 (six) hours as needed. 04/19/16   Frederica Kuster, PA-C  insulin glargine (LANTUS) 100 UNIT/ML injection Inject 20 Units into the skin at bedtime.    Historical Provider, MD  Liraglutide (VICTOZA) 18 MG/3ML SOPN Inject 1.8 mg into the skin 2 (two) times daily.     Historical Provider, MD  loperamide (IMODIUM) 2 MG capsule Take 1 capsule (2 mg total) by mouth 4 (four) times daily as needed for diarrhea or loose stools. 10/03/15   Kristen N Ward, DO  neomycin-polymyxin b-dexamethasone (MAXITROL) 3.5-10000-0.1 OINT Place 1 application into the left eye 4 (four) times daily. 03/13/16   Historical Provider, MD  NON FORMULARY cpap    Historical Provider, MD  nystatin (MYCOSTATIN) 100000 UNIT/ML suspension Take 5 mLs (500,000 Units total) by mouth 4 (four) times daily. 10/20/15   Ezequiel Essex, MD  omeprazole (PRILOSEC) 40 MG capsule Take 40 mg by mouth 2 (two) times daily.     Historical Provider, MD  ondansetron (ZOFRAN ODT) 4 MG disintegrating tablet Take 1 tablet (4 mg total) by mouth every 8  (eight) hours as needed for nausea or vomiting. 10/03/15   Kristen N Ward, DO  oxyCODONE-acetaminophen (PERCOCET) 10-325 MG tablet Take 1 tablet by mouth 4 (four) times daily.  10/07/15   Historical Provider, MD  temazepam (RESTORIL) 30 MG capsule Take 1 capsule by mouth at bedtime. 10/09/15   Historical Provider, MD  traZODone (DESYREL) 50 MG tablet Take 50 mg by mouth at bedtime.    Historical Provider, MD    Family History Family History  Problem Relation Age of Onset  . Graves' disease Mother     Social History Social History  Substance Use Topics  . Smoking status: Current Every Day Smoker    Packs/day: 0.50    Years: 8.00  Types: Cigarettes  . Smokeless tobacco: Never Used  . Alcohol use No     Allergies   Fentanyl; Penicillins; Triamterene; Naproxen; and Ultram [tramadol]   Review of Systems Review of Systems 10 systems reviewed and all are negative for acute change except as noted in the HPI.  Physical Exam Updated Vital Signs BP 136/97 (BP Location: Right Arm)   Pulse 97   Temp 98.4 F (36.9 C) (Oral)   Resp 22   Ht _0  (1.778 m)   Wt 254 lb (115.2 kg)   LMP 05/09/2016 (Exact Date)   SpO2 99%   BMI 36.45 kg/m   Physical Exam  Constitutional: She is oriented to person, place, and time. She appears well-developed and well-nourished. No distress.  HENT:  Head: Normocephalic and atraumatic.  Nose: Nose normal.  Mouth/Throat: Oropharynx is clear and moist. No oropharyngeal exudate.  Eyes: Conjunctivae and EOM are normal. Pupils are equal, round, and reactive to light. No scleral icterus.  Neck: Normal range of motion. Neck supple. No JVD present. No tracheal deviation present. No thyromegaly present.  Cardiovascular: Normal rate, regular rhythm and normal heart sounds.  Exam reveals no gallop and no friction rub.   No murmur heard. Pulmonary/Chest: Effort normal and breath sounds normal. No respiratory distress. She has no wheezes.  Tenderness over the  midline anterior chest wall.   Abdominal: Soft. Bowel sounds are normal. She exhibits no distension and no mass. There is no tenderness. There is no rebound and no guarding.  Musculoskeletal: Normal range of motion. She exhibits no edema or tenderness.  Lymphadenopathy:    She has no cervical adenopathy.  Neurological: She is alert and oriented to person, place, and time. No cranial nerve deficit. She exhibits normal muscle tone.  Skin: Skin is warm and dry. No rash noted. No erythema. No pallor.  Nursing note and vitals reviewed.    ED Treatments / Results  DIAGNOSTIC STUDIES: Oxygen Saturation is 99 percent on room air which is normal by my interpretation.    COORDINATION OF CARE: 3:44 AM Discussed treatment plan with pt at bedside and pt agreed to plan.  Labs (all labs ordered are listed, but only abnormal results are displayed) Labs Reviewed  CBC WITH DIFFERENTIAL/PLATELET - Abnormal; Notable for the following:       Result Value   WBC 11.2 (*)    RBC 5.48 (*)    Hemoglobin 17.7 (*)    HCT 48.1 (*)    MCHC 36.8 (*)    All other components within normal limits  COMPREHENSIVE METABOLIC PANEL    EKG  EKG Interpretation None       Radiology Dg Chest 2 View  Result Date: 05/16/2016 CLINICAL DATA:  Left chest pain EXAM: CHEST  2 VIEW COMPARISON:  04/05/2016 FINDINGS: The heart size and mediastinal contours are within normal limits. Both lungs are clear. The visualized skeletal structures are unremarkable. IMPRESSION: No active cardiopulmonary disease. Electronically Signed   By: Donavan Foil M.D.   On: 05/16/2016 23:43    Procedures Procedures (including critical care time)  Medications Ordered in ED Medications - No data to display   Initial Impression / Assessment and Plan / ED Course  I have reviewed the triage vital signs and the nursing notes.  Pertinent labs & imaging results that were available during my care of the patient were reviewed by me and  considered in my medical decision making (see chart for details).     Patient presents to the  ED for chest pain.  Her history is not consistent with ACS.  She is PERC negative.  CXR is negative for any pneumonia.  EKG is unchanged from previous.  She has TTP of the chest wall.  She was advised on tylenol or ibuprofen for home use. PCP fu advised within 3 days.  She appears well and in NAD. VS are normal. PAtient safe for DC.     Final Clinical Impressions(s) / ED Diagnoses   Final diagnoses:  None    New Prescriptions New Prescriptions   No medications on file    I personally performed the services described in this documentation, which was scribed in my presence. The recorded information has been reviewed and is accurate.      Everlene Balls, MD 05/17/16 [phone]

## 2016-05-17 NOTE — ED Triage Notes (Signed)
Lab called saying the 2nd cmp has also hemooyzed

## 2016-05-17 NOTE — ED Triage Notes (Signed)
The pt needs her cmp collected  The initial one clotted l  Needs re-collect

## 2016-07-13 ENCOUNTER — Emergency Department (HOSPITAL_COMMUNITY)
Admission: EM | Admit: 2016-07-13 | Discharge: 2016-07-14 | Discharge status: Against Medical Advice | Payer: Medicaid Other | Attending: Emergency Medicine | Admitting: Emergency Medicine

## 2016-07-13 ENCOUNTER — Emergency Department (HOSPITAL_COMMUNITY): Payer: Medicaid Other

## 2016-07-13 ENCOUNTER — Encounter (HOSPITAL_COMMUNITY): Payer: Self-pay | Admitting: Emergency Medicine

## 2016-07-13 DIAGNOSIS — J45909 Unspecified asthma, uncomplicated: Secondary | ICD-10-CM | POA: Diagnosis not present

## 2016-07-13 DIAGNOSIS — Z794 Long term (current) use of insulin: Secondary | ICD-10-CM | POA: Insufficient documentation

## 2016-07-13 DIAGNOSIS — Z7982 Long term (current) use of aspirin: Secondary | ICD-10-CM | POA: Diagnosis not present

## 2016-07-13 DIAGNOSIS — E119 Type 2 diabetes mellitus without complications: Secondary | ICD-10-CM | POA: Diagnosis not present

## 2016-07-13 DIAGNOSIS — F17210 Nicotine dependence, cigarettes, uncomplicated: Secondary | ICD-10-CM | POA: Diagnosis not present

## 2016-07-13 DIAGNOSIS — I10 Essential (primary) hypertension: Secondary | ICD-10-CM | POA: Insufficient documentation

## 2016-07-13 DIAGNOSIS — F909 Attention-deficit hyperactivity disorder, unspecified type: Secondary | ICD-10-CM | POA: Insufficient documentation

## 2016-07-13 DIAGNOSIS — R569 Unspecified convulsions: Secondary | ICD-10-CM | POA: Diagnosis not present

## 2016-07-13 LAB — I-STAT BETA HCG BLOOD, ED (MC, WL, AP ONLY)

## 2016-07-13 LAB — CBG MONITORING, ED: Glucose-Capillary: 274 mg/dL — ABNORMAL HIGH (ref 65–99)

## 2016-07-13 NOTE — ED Triage Notes (Signed)
Pt brought in via EMS from home per pt family 5-7 minute seizure at home. Pt has hx of seizures and is not taking medications. Pt AOx4. No incontinence or eye shift. Pt ambulatory at scene.Pt c/o headache and neck pain at this time. Seizure precautions initiated.

## 2016-07-13 NOTE — ED Notes (Signed)
Bed: WA06 Expected date:  Expected time:  Means of arrival:  Comments: 31 yr old seizure

## 2016-07-13 NOTE — ED Provider Notes (Signed)
[place] DEPT Provider Note   CSN: [id number] Arrival date & time: 07/13/16  2117  By signing my name below, I, Rebecca Moyer, attest that this documentation has been prepared under the direction and in the presence of Montine Circle, PA-C. Electronically Signed: Lise Auer, ED Scribe. 07/13/16. 10:54 PM.   History   Chief Complaint Chief Complaint  Patient presents with  . Seizures    Hx seizures    The history is provided by the patient and a parent. No language interpreter was used.  Seizures   Associated symptoms include headaches.    HPI Comments: Rebecca Moyer is a 31 y.o. female with a PMHx of GERD, HTN, Bipolar disorder, asthma, anxiety, and DM II without complications brought in by ambulance, who presents to the Emergency Department complaining of one sudden onset of a seizure PTA. Her associated symptoms include fatigue, headache, and tingling. Per pt's family they were sitting on the back porch when then the pt gazed off and  "she began shaking" which he notes lasted around 5 minutes. EMS was called after this episode. After the episode they note the pt was  "out of it" for five to ten minutes. As she was returning to her baseline EMS arrived, and the pt was transported. Her last episode like such was around two weeks ago. Pt's father notes pt has a hx of TIAs and seizures that started back in 2017, he is unsure of how many episodes she has had to date. Pt notes she is not currently followed by a neurologist. Denies currently being on medication for seizures or TIAs. At this current time patient symptoms are still present: headache, tingling, polydipsia. Denies weakness or numbness outside of her baseline.   Past Medical History:  Diagnosis Date  . Anxiety   . Arthritis    knees  . Asthma 06/07/11  . Asthma    prn inhaler  . Bipolar disorder ([place])   . Depression 06/07/11  . Depression   . Diabetes mellitus without complication ([place])   . Difficulty swallowing solids     due to GERD and slow motility of esophagus, per pt.  Marland Kitchen GERD (gastroesophageal reflux disease)   . H/O amenorrhea 01/27/04  . H/O varicella   . Hidradenitis 09/2014   bilateral axilla, left buttock, left groin  . Hypertension 06/07/11  . Hypertension    states under control with med., has been on med. x 6 yr.  . Irregular periods/menstrual cycles 05/22/04  . Multiple personality disorder   . Nasal congestion 09/27/2014  . Non-insulin dependent type 2 diabetes mellitus ([place])   . Obesity 06/07/11  . PCOS (polycystic ovarian syndrome) 06/07/11  . Schizophrenia ([place])   . Sleep apnea    does not use CPAP every night, per pt.  . Smoker 06/07/11  . Tachycardia    no cardiologist, per pt.    Patient Active Problem List   Diagnosis Date Noted  . Hidradenitis 09/28/2014  . 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 DEPRESSIVE DISORDER, NOS 05/09/2006  . ATTENTION DEFICIT, W/HYPERACTIVITY 05/09/2006    Past Surgical History:  Procedure Laterality Date  . adnoids  06/07/11  . HYDRADENITIS EXCISION Bilateral 09/28/2014   Procedure: EXCISION HIDRADENITIS BILATERAL AXILLARY;  Surgeon: Armandina Gemma, MD;  Location: [facility];  Service: General;  Laterality: Bilateral;  . TONSILLECTOMY  06/07/11  . TONSILLECTOMY AND ADENOIDECTOMY  06/26/2000  . WISDOM TOOTH EXTRACTION  06/07/11  . WISDOM TOOTH EXTRACTION      OB  History    Gravida Para Term Preterm AB Living   0 0 0 0 0     SAB TAB Ectopic Multiple Live Births   0 0 0           Home Medications    Prior to Admission medications   Medication Sig Start Date End Date Taking? Authorizing Provider  albuterol (PROVENTIL HFA;VENTOLIN HFA) 108 (90 BASE) MCG/ACT inhaler Inhale 2 puffs into the lungs every 6 (six) hours as needed for wheezing or shortness of breath. 01/01/14  Yes Nanavati, Ankit, MD  albuterol (PROVENTIL) (2.5 MG/3ML) 0.083% nebulizer solution Take 2.5 mg by nebulization every 8 (eight) hours as needed. Shortness of  breath   Yes [provider]  ALPRAZolam (XANAX) 1 MG tablet Take 1 mg by mouth 3 (three) times daily.    Yes [provider]  aspirin EC 81 MG tablet Take 81 mg by mouth daily.   Yes [provider]  atenolol (TENORMIN) 50 MG tablet Take 50 mg by mouth every evening.  01/20/14  Yes [provider]  atorvastatin (LIPITOR) 80 MG tablet Take 80 mg by mouth daily.   Yes [provider]  cycloSPORINE (RESTASIS) 0.05 % ophthalmic emulsion Place 1 drop into both eyes 2 (two) times daily.   Yes [provider]  diltiazem (CARDIZEM CD) 120 MG 24 hr capsule Take 1 capsule by mouth daily. 03/13/16  Yes [provider]  diltiazem (DILACOR XR) 120 MG 24 hr capsule Take 120 mg by mouth every evening.    Yes [provider]  insulin detemir (LEVEMIR) 100 UNIT/ML injection Inject 20 Units into the skin at bedtime.   Yes [provider]  Liraglutide (VICTOZA) 18 MG/3ML SOPN Inject 1.8 mg into the skin 2 (two) times daily.    Yes [provider]  Omega-3 Fatty Acids (FISH OIL) 1000 MG CAPS Take 1 capsule by mouth daily.   Yes [provider]  omeprazole (PRILOSEC) 40 MG capsule Take 40 mg by mouth 3 (three) times daily.    Yes [provider]  oxyCODONE-acetaminophen (PERCOCET) 10-325 MG tablet Take 1 tablet by mouth 3 (three) times daily.  10/07/15  Yes [provider]  temazepam (RESTORIL) 30 MG capsule Take 1 capsule by mouth at bedtime. 10/09/15  Yes [provider]  doxycycline (VIBRAMYCIN) 100 MG capsule Take 1 capsule (100 mg total) by mouth 2 (two) times daily. Patient not taking: Reported on 07/13/2016 04/19/16   Frederica Kuster, PA-C  glucose monitoring kit (FREESTYLE) monitoring kit 1 each by Does not apply route as needed for other. 10/20/15   Rancour, Annie Main, MD  ibuprofen (ADVIL,MOTRIN) 600 MG tablet Take 1 tablet (600 mg total) by mouth every 6 (six) hours as needed. Patient not  taking: Reported on 07/13/2016 04/19/16   Frederica Kuster, PA-C  loperamide (IMODIUM) 2 MG capsule Take 1 capsule (2 mg total) by mouth 4 (four) times daily as needed for diarrhea or loose stools. Patient not taking: Reported on 07/13/2016 10/03/15   Ward, Delice Bison, DO  nystatin (MYCOSTATIN) 100000 UNIT/ML suspension Take 5 mLs (500,000 Units total) by mouth 4 (four) times daily. Patient not taking: Reported on 07/13/2016 10/20/15   Ezequiel Essex, MD  ondansetron (ZOFRAN ODT) 4 MG disintegrating tablet Take 1 tablet (4 mg total) by mouth every 8 (eight) hours as needed for nausea or vomiting. Patient not taking: Reported on 07/13/2016 10/03/15   Ward, Delice Bison, DO    Family History Family  History  Problem Relation Age of Onset  . Graves' disease Mother     Social History Social History  Substance Use Topics  . Smoking status: Current Every Day Smoker    Packs/day: 0.50    Years: 8.00    Types: Cigarettes  . Smokeless tobacco: Never Used  . Alcohol use No     Allergies   Fentanyl; Penicillins; Triamterene; Naproxen; and Ultram [tramadol]   Review of Systems Review of Systems  Constitutional: Positive for fatigue.  Neurological: Positive for seizures, numbness and headaches.  All other systems reviewed and are negative.    Physical Exam Updated Vital Signs BP 128/72 (BP Location: Right Arm)   Pulse 87   Temp 98.7 F (37.1 C) (Oral)   Resp 18   Ht '5\' 10"'  (1.778 m)   Wt 260 lb (117.9 kg)   LMP 06/21/2016   SpO2 96%   BMI 37.31 kg/m   Physical Exam  Constitutional: She is oriented to person, place, and time. She appears well-developed and well-nourished.  HENT:  Head: Normocephalic and atraumatic.  Cardiovascular: Normal rate and regular rhythm.   No murmur heard. Pulmonary/Chest: Effort normal and breath sounds normal. No respiratory distress.  Abdominal: Soft. There is no tenderness. There is no rebound and no guarding.  Musculoskeletal: She exhibits no edema or  tenderness.  Moves all extremities.   Neurological: She is alert and oriented to person, place, and time.  CN 3-12 intact. Speaks clear. Movements are goal oriented. Normal finger to nose. No pronantor drift.   Skin: Skin is warm and dry.  Psychiatric: She has a normal mood and affect. Her behavior is normal.  Nursing note and vitals reviewed.   ED Treatments / Results   DIAGNOSTIC STUDIES: Oxygen Saturation is 96% on RA, adequate by my interpretation.   COORDINATION OF CARE: 10:30 PM-Discussed next steps with pt. Pt verbalized understanding and is agreeable with the plan.    Labs (all labs ordered are listed, but only abnormal results are displayed) Labs Reviewed - No data to display  EKG  EKG Interpretation None       Radiology No results found.  Procedures Procedures (including critical care time)  Medications Ordered in ED Medications - No data to display   Initial Impression / Assessment and Plan / ED Course  I have reviewed the triage vital signs and the nursing notes.  Pertinent labs & imaging results that were available during my care of the patient were reviewed by me and considered in my medical decision making (see chart for details).     Patient with reported seizure-like activity. She had about 5 minutes of generalized full-body shaking and was apparently postictal afterward. She reports having a history of seizures, but is not currently seeing anyone for this. She does not take any medication for this. Her family member notes that her blood sugars usually running high when she has these seizure-like activities. She complains of fatigue only now, she denies any numbness, weakness, or tingling now. She is neurovascularly intact. CT imaging is negative. Laboratory workup is reassuring. Patient is somewhat hyperglycemic, I will encourage her to be more compliant with her medications and diet. I will also recommend that she follow-up with a neurologist, she is  not currently seeing anyone for this. She will be advised that she may not drive. I do not feel that additional workup is indicated at this time as patient is back to baseline and is neurologically intact.  Final Clinical Impressions(s) /  ED Diagnoses   Final diagnoses:  Seizure-like activity [facility])    New Prescriptions New Prescriptions   No medications on file   I personally performed the services described in this documentation, which was scribed in my presence. The recorded information has been reviewed and is accurate.       Montine Circle, PA-C 07/14/16 0107    Tanna Furry, MD 08/07/16 [phone]

## 2016-07-14 LAB — BASIC METABOLIC PANEL
Anion gap: 14 (ref 5–15)
BUN: 9 mg/dL (ref 6–20)
CALCIUM: 9.4 mg/dL (ref 8.9–10.3)
CO2: 18 mmol/L — AB (ref 22–32)
CREATININE: 0.68 mg/dL (ref 0.44–1.00)
Chloride: 104 mmol/L (ref 101–111)
GFR calc non Af Amer: >60 mL/min (ref >60)
Glucose, Bld: 276 mg/dL — ABNORMAL HIGH (ref 65–99)
Potassium: 4.4 mmol/L (ref 3.5–5.1)
SODIUM: 136 mmol/L (ref 135–145)

## 2016-07-14 LAB — CBC WITH DIFFERENTIAL/PLATELET
BASOS ABS: 0.1 10*3/uL (ref 0.0–0.1)
Basophils Relative: 1 %
EOS ABS: 0.3 10*3/uL (ref 0.0–0.7)
Eosinophils Relative: 3 %
HCT: 41.8 % (ref 36.0–46.0)
Hemoglobin: 15.1 g/dL — ABNORMAL HIGH (ref 12.0–15.0)
LYMPHS ABS: 3.3 10*3/uL (ref 0.7–4.0)
Lymphocytes Relative: 36 %
MCH: 31.2 pg (ref 26.0–34.0)
MCHC: 36.7 g/dL — ABNORMAL HIGH (ref 30.0–36.0)
MCV: 85.1 fL (ref 78.0–100.0)
MONO ABS: 0.4 10*3/uL (ref 0.1–1.0)
Monocytes Relative: 4 %
NEUTROS ABS: 5.2 10*3/uL (ref 1.7–7.7)
Neutrophils Relative %: 56 %
Platelets: 218 10*3/uL (ref 150–400)
RBC: 4.91 MIL/uL (ref 3.87–5.11)
RDW: 12.7 % (ref 11.5–15.5)
WBC: 9.3 10*3/uL (ref 4.0–10.5)

## 2016-07-14 NOTE — ED Notes (Signed)
Went in pts room to hourly round. IV was pulled out by pt and pt nor family is in room. Pt has eloped unable to obtain discharge vitals or signature. Pt ambulatory at discharge. Marlon Pel, PA-C made aware

## 2017-04-20 ENCOUNTER — Encounter (HOSPITAL_COMMUNITY): Payer: Self-pay | Admitting: Emergency Medicine

## 2017-04-20 ENCOUNTER — Emergency Department (HOSPITAL_COMMUNITY)
Admission: EM | Admit: 2017-04-20 | Discharge: 2017-04-20 | Discharge status: Against Medical Advice | Payer: Medicaid Other | Attending: Emergency Medicine | Admitting: Emergency Medicine

## 2017-04-20 DIAGNOSIS — N939 Abnormal uterine and vaginal bleeding, unspecified: Secondary | ICD-10-CM | POA: Insufficient documentation

## 2017-04-20 DIAGNOSIS — Z5321 Procedure and treatment not carried out due to patient leaving prior to being seen by health care provider: Secondary | ICD-10-CM | POA: Diagnosis not present

## 2017-04-20 NOTE — ED Triage Notes (Signed)
Pt reports heavy vaginal bleeding started last night with severe cramps.  Pt reports normally has very heavy flow, but this is worse than usual.

## 2017-04-20 NOTE — ED Notes (Signed)
Pt not in waiting area. People in waiting room states that she left. Per registration, the pt left.

## 2017-07-23 ENCOUNTER — Other Ambulatory Visit: Payer: Self-pay | Admitting: Preventative Medicine

## 2017-07-23 DIAGNOSIS — J45909 Unspecified asthma, uncomplicated: Secondary | ICD-10-CM

## 2017-07-23 DIAGNOSIS — J42 Unspecified chronic bronchitis: Secondary | ICD-10-CM

## 2017-07-23 DIAGNOSIS — J449 Chronic obstructive pulmonary disease, unspecified: Secondary | ICD-10-CM

## 2017-08-08 ENCOUNTER — Ambulatory Visit: Payer: Medicaid Other | Attending: Preventative Medicine

## 2017-10-07 ENCOUNTER — Encounter (HOSPITAL_COMMUNITY): Payer: Self-pay

## 2017-10-07 ENCOUNTER — Other Ambulatory Visit: Payer: Self-pay

## 2017-10-07 ENCOUNTER — Inpatient Hospital Stay (HOSPITAL_COMMUNITY)
Admission: EM | Admit: 2017-10-07 | Discharge: 2017-10-10 | DRG: 101 | Disposition: A | Discharge status: Home / Self Care | Payer: Medicaid Other | Attending: Internal Medicine | Admitting: Internal Medicine

## 2017-10-07 ENCOUNTER — Emergency Department (HOSPITAL_COMMUNITY): Payer: Medicaid Other

## 2017-10-07 DIAGNOSIS — Z880 Allergy status to penicillin: Secondary | ICD-10-CM | POA: Diagnosis not present

## 2017-10-07 DIAGNOSIS — Z7982 Long term (current) use of aspirin: Secondary | ICD-10-CM

## 2017-10-07 DIAGNOSIS — Z885 Allergy status to narcotic agent status: Secondary | ICD-10-CM

## 2017-10-07 DIAGNOSIS — F329 Major depressive disorder, single episode, unspecified: Secondary | ICD-10-CM

## 2017-10-07 DIAGNOSIS — G894 Chronic pain syndrome: Secondary | ICD-10-CM | POA: Diagnosis not present

## 2017-10-07 DIAGNOSIS — F3132 Bipolar disorder, current episode depressed, moderate: Secondary | ICD-10-CM

## 2017-10-07 DIAGNOSIS — E669 Obesity, unspecified: Secondary | ICD-10-CM | POA: Diagnosis not present

## 2017-10-07 DIAGNOSIS — G4700 Insomnia, unspecified: Secondary | ICD-10-CM

## 2017-10-07 DIAGNOSIS — Z6832 Body mass index (BMI) 32.0-32.9, adult: Secondary | ICD-10-CM | POA: Diagnosis not present

## 2017-10-07 DIAGNOSIS — Z888 Allergy status to other drugs, medicaments and biological substances status: Secondary | ICD-10-CM | POA: Diagnosis not present

## 2017-10-07 DIAGNOSIS — K219 Gastro-esophageal reflux disease without esophagitis: Secondary | ICD-10-CM | POA: Diagnosis not present

## 2017-10-07 DIAGNOSIS — Z79891 Long term (current) use of opiate analgesic: Secondary | ICD-10-CM

## 2017-10-07 DIAGNOSIS — F419 Anxiety disorder, unspecified: Secondary | ICD-10-CM | POA: Diagnosis present

## 2017-10-07 DIAGNOSIS — Z7951 Long term (current) use of inhaled steroids: Secondary | ICD-10-CM

## 2017-10-07 DIAGNOSIS — R569 Unspecified convulsions: Secondary | ICD-10-CM | POA: Diagnosis present

## 2017-10-07 DIAGNOSIS — Z9114 Patient's other noncompliance with medication regimen: Secondary | ICD-10-CM | POA: Diagnosis not present

## 2017-10-07 DIAGNOSIS — I10 Essential (primary) hypertension: Secondary | ICD-10-CM | POA: Diagnosis not present

## 2017-10-07 DIAGNOSIS — E7800 Pure hypercholesterolemia, unspecified: Secondary | ICD-10-CM | POA: Diagnosis present

## 2017-10-07 DIAGNOSIS — E1065 Type 1 diabetes mellitus with hyperglycemia: Secondary | ICD-10-CM | POA: Diagnosis present

## 2017-10-07 DIAGNOSIS — F3130 Bipolar disorder, current episode depressed, mild or moderate severity, unspecified: Secondary | ICD-10-CM | POA: Diagnosis not present

## 2017-10-07 DIAGNOSIS — J45909 Unspecified asthma, uncomplicated: Secondary | ICD-10-CM | POA: Diagnosis not present

## 2017-10-07 DIAGNOSIS — F32A Depression, unspecified: Secondary | ICD-10-CM

## 2017-10-07 DIAGNOSIS — Z79899 Other long term (current) drug therapy: Secondary | ICD-10-CM | POA: Diagnosis not present

## 2017-10-07 DIAGNOSIS — F17210 Nicotine dependence, cigarettes, uncomplicated: Secondary | ICD-10-CM | POA: Diagnosis not present

## 2017-10-07 DIAGNOSIS — R739 Hyperglycemia, unspecified: Secondary | ICD-10-CM | POA: Diagnosis present

## 2017-10-07 LAB — COMPREHENSIVE METABOLIC PANEL
ALT: 44 U/L (ref 0–44)
ANION GAP: 13 (ref 5–15)
AST: 41 U/L (ref 15–41)
Albumin: 4 g/dL (ref 3.5–5.0)
Alkaline Phosphatase: 49 U/L (ref 38–126)
BUN: 8 mg/dL (ref 6–20)
CALCIUM: 9.2 mg/dL (ref 8.9–10.3)
CHLORIDE: 105 mmol/L (ref 98–111)
CO2: 17 mmol/L — ABNORMAL LOW (ref 22–32)
CREATININE: 0.72 mg/dL (ref 0.44–1.00)
Glucose, Bld: 293 mg/dL — ABNORMAL HIGH (ref 70–99)
Potassium: 3.9 mmol/L (ref 3.5–5.1)
Sodium: 135 mmol/L (ref 135–145)
Total Bilirubin: 0.7 mg/dL (ref 0.3–1.2)
Total Protein: 6.6 g/dL (ref 6.5–8.1)

## 2017-10-07 LAB — CBC WITH DIFFERENTIAL/PLATELET
Basophils Absolute: 0 10*3/uL (ref 0.0–0.1)
Basophils Relative: 1 %
EOS PCT: 2 %
Eosinophils Absolute: 0.2 10*3/uL (ref 0.0–0.7)
HCT: 42.5 % (ref 36.0–46.0)
Hemoglobin: 15.4 g/dL — ABNORMAL HIGH (ref 12.0–15.0)
LYMPHS ABS: 3 10*3/uL (ref 0.7–4.0)
LYMPHS PCT: 36 %
MCH: 31.2 pg (ref 26.0–34.0)
MCHC: 36.2 g/dL — AB (ref 30.0–36.0)
MCV: 86 fL (ref 78.0–100.0)
MONOS PCT: 4 %
Monocytes Absolute: 0.3 10*3/uL (ref 0.1–1.0)
Neutro Abs: 4.8 10*3/uL (ref 1.7–7.7)
Neutrophils Relative %: 57 %
PLATELETS: 265 10*3/uL (ref 150–400)
RBC: 4.94 MIL/uL (ref 3.87–5.11)
RDW: 12.6 % (ref 11.5–15.5)
WBC: 8.4 10*3/uL (ref 4.0–10.5)

## 2017-10-07 LAB — I-STAT CHEM 8, ED
BUN: 7 mg/dL (ref 6–20)
Calcium, Ion: 1.15 mmol/L (ref 1.15–1.40)
Chloride: 108 mmol/L (ref 98–111)
Creatinine, Ser: 0.4 mg/dL — ABNORMAL LOW (ref 0.44–1.00)
Glucose, Bld: 306 mg/dL — ABNORMAL HIGH (ref 70–99)
HEMATOCRIT: 42 % (ref 36.0–46.0)
Hemoglobin: 14.3 g/dL (ref 12.0–15.0)
Potassium: 3.7 mmol/L (ref 3.5–5.1)
SODIUM: 138 mmol/L (ref 135–145)
TCO2: 17 mmol/L — AB (ref 22–32)

## 2017-10-07 LAB — ACETAMINOPHEN LEVEL: Acetaminophen (Tylenol), Serum: <10 ug/mL — ABNORMAL LOW (ref 10–30)

## 2017-10-07 LAB — URINALYSIS, ROUTINE W REFLEX MICROSCOPIC
Bacteria, UA: NONE SEEN
Bilirubin Urine: NEGATIVE
Glucose, UA: >=500 mg/dL — AB
Hgb urine dipstick: NEGATIVE
Ketones, ur: 5 mg/dL — AB
Leukocytes, UA: NEGATIVE
Nitrite: NEGATIVE
Protein, ur: NEGATIVE mg/dL
Specific Gravity, Urine: 1.03 (ref 1.005–1.030)
pH: 6 (ref 5.0–8.0)

## 2017-10-07 LAB — RAPID URINE DRUG SCREEN, HOSP PERFORMED
Amphetamines: NOT DETECTED
Barbiturates: NOT DETECTED
Benzodiazepines: POSITIVE — AB
Cocaine: NOT DETECTED
Opiates: NOT DETECTED
Tetrahydrocannabinol: NOT DETECTED

## 2017-10-07 LAB — I-STAT BETA HCG BLOOD, ED (MC, WL, AP ONLY): I-stat hCG, quantitative: <5 m[IU]/mL (ref <5)

## 2017-10-07 LAB — I-STAT TROPONIN, ED: Troponin i, poc: 0 ng/mL (ref 0.00–0.08)

## 2017-10-07 LAB — ETHANOL: Alcohol, Ethyl (B): <10 mg/dL (ref <10)

## 2017-10-07 LAB — SALICYLATE LEVEL: Salicylate Lvl: <7 mg/dL (ref 2.8–30.0)

## 2017-10-07 LAB — CBG MONITORING, ED: GLUCOSE-CAPILLARY: 223 mg/dL — AB (ref 70–99)

## 2017-10-07 MED ORDER — LORAZEPAM 2 MG/ML IJ SOLN
2.0000 mg | Freq: Once | INTRAMUSCULAR | Status: AC
  Administered 2017-10-07: 2 mg via INTRAVENOUS

## 2017-10-07 MED ORDER — SODIUM CHLORIDE 0.9 % IV BOLUS
1000.0000 mL | Freq: Once | INTRAVENOUS | Status: AC
  Administered 2017-10-07: 1000 mL via INTRAVENOUS

## 2017-10-07 MED ORDER — INSULIN ASPART 100 UNIT/ML ~~LOC~~ SOLN
0.0000 [IU] | SUBCUTANEOUS | Status: DC
  Administered 2017-10-07 – 2017-10-08 (×2): 5 [IU] via SUBCUTANEOUS
  Administered 2017-10-08 (×2): 11 [IU] via SUBCUTANEOUS
  Administered 2017-10-08: 3 [IU] via SUBCUTANEOUS
  Administered 2017-10-08 (×2): 5 [IU] via SUBCUTANEOUS
  Administered 2017-10-09: 8 [IU] via SUBCUTANEOUS
  Administered 2017-10-09 (×2): 11 [IU] via SUBCUTANEOUS
  Administered 2017-10-09 – 2017-10-10 (×7): 8 [IU] via SUBCUTANEOUS

## 2017-10-07 MED ORDER — OXYCODONE HCL 5 MG PO TABS
5.0000 mg | ORAL_TABLET | ORAL | Status: DC | PRN
  Administered 2017-10-07 – 2017-10-10 (×17): 5 mg via ORAL
  Filled 2017-10-07 (×17): qty 1

## 2017-10-07 MED ORDER — ONDANSETRON HCL 4 MG PO TABS: 4.0000 mg | ORAL_TABLET | Freq: Four times a day (QID) | ORAL | Status: DC | PRN

## 2017-10-07 MED ORDER — ATENOLOL 50 MG PO TABS
50.0000 mg | ORAL_TABLET | Freq: Every evening | ORAL | Status: DC
  Administered 2017-10-07 – 2017-10-10 (×4): 50 mg via ORAL
  Filled 2017-10-07: qty 2
  Filled 2017-10-07: qty 1
  Filled 2017-10-07: qty 2
  Filled 2017-10-07: qty 1
  Filled 2017-10-07: qty 2
  Filled 2017-10-07 (×2): qty 1
  Filled 2017-10-07: qty 2

## 2017-10-07 MED ORDER — LORAZEPAM 2 MG/ML IJ SOLN
INTRAMUSCULAR | Status: AC
Start: 2017-10-07 — End: 2017-10-07
  Administered 2017-10-07: 2 mg via INTRAVENOUS
  Filled 2017-10-07: qty 2

## 2017-10-07 MED ORDER — ENOXAPARIN SODIUM 40 MG/0.4ML ~~LOC~~ SOLN: 40.0000 mg | SUBCUTANEOUS | Status: DC

## 2017-10-07 MED ORDER — OXYCODONE-ACETAMINOPHEN 10-325 MG PO TABS
1.0000 | ORAL_TABLET | ORAL | Status: DC | PRN
Start: 2017-10-07 — End: 2017-10-07

## 2017-10-07 MED ORDER — ATORVASTATIN CALCIUM 80 MG PO TABS
80.0000 mg | ORAL_TABLET | Freq: Every evening | ORAL | Status: DC
  Administered 2017-10-07 – 2017-10-10 (×4): 80 mg via ORAL
  Filled 2017-10-07 (×4): qty 1

## 2017-10-07 MED ORDER — ACETAMINOPHEN 325 MG PO TABS
650.0000 mg | ORAL_TABLET | Freq: Four times a day (QID) | ORAL | Status: DC | PRN
  Administered 2017-10-08: 650 mg via ORAL
  Filled 2017-10-07 (×2): qty 2

## 2017-10-07 MED ORDER — ONDANSETRON HCL 4 MG/2ML IJ SOLN
4.0000 mg | Freq: Four times a day (QID) | INTRAMUSCULAR | Status: DC | PRN
  Administered 2017-10-09 – 2017-10-10 (×2): 4 mg via INTRAVENOUS
  Filled 2017-10-07 (×2): qty 2

## 2017-10-07 MED ORDER — DIAZEPAM 5 MG/ML IJ SOLN
5.0000 mg | Freq: Once | INTRAMUSCULAR | Status: AC
  Administered 2017-10-07: 5 mg via INTRAVENOUS
  Filled 2017-10-07: qty 2

## 2017-10-07 MED ORDER — ACETAMINOPHEN 650 MG RE SUPP: 650.0000 mg | Freq: Four times a day (QID) | RECTAL | Status: DC | PRN

## 2017-10-07 MED ORDER — ALPRAZOLAM 0.5 MG PO TABS
1.0000 mg | ORAL_TABLET | Freq: Four times a day (QID) | ORAL | Status: DC
  Administered 2017-10-07 – 2017-10-10 (×12): 1 mg via ORAL
  Filled 2017-10-07 (×12): qty 2

## 2017-10-07 MED ORDER — OXYCODONE-ACETAMINOPHEN 5-325 MG PO TABS
1.0000 | ORAL_TABLET | ORAL | Status: DC | PRN
  Administered 2017-10-07 – 2017-10-10 (×17): 1 via ORAL
  Filled 2017-10-07 (×17): qty 1

## 2017-10-07 MED ORDER — PANTOPRAZOLE SODIUM 40 MG PO TBEC
40.0000 mg | DELAYED_RELEASE_TABLET | Freq: Every day | ORAL | Status: DC
  Administered 2017-10-07 – 2017-10-10 (×4): 40 mg via ORAL
  Filled 2017-10-07 (×4): qty 1

## 2017-10-07 MED ORDER — DILTIAZEM HCL ER COATED BEADS 120 MG PO CP24
120.0000 mg | ORAL_CAPSULE | Freq: Every evening | ORAL | Status: DC
  Administered 2017-10-07 – 2017-10-10 (×4): 120 mg via ORAL
  Filled 2017-10-07 (×4): qty 1

## 2017-10-07 MED ORDER — SODIUM CHLORIDE 0.9 % IV SOLN: INTRAVENOUS | Status: DC

## 2017-10-07 NOTE — H&P (Signed)
History and Physical  BLYSS LUGAR KVQ:[id number] DOB: 16-Jun-1985 DOA: 10/07/2017   PCP: [name], MD   Patient coming from: Home via EMS   Chief Complaint: Seizure   HPI: Rebecca Moyer is a 32 y.o. female with medical history significant for Type I DM, medication noncompliance, chronic pain on narcotics, seizure disorder with suspicion of pseudoseizures who is being admitted to [facility] due to recurrent seizures. History is taken from patient who currently seems to be back to baseline. She says she has been having seizures frequently for the last month or so, she takes Lamictal, has been taking it as prescribed but does not have a neurologist and did not discuss her worsening seizures with anyone. She remembers waking up today and feeling fine, the next thing she recalls is waking up in the ambulance and then later in the ER. Currently denies any discomfort or anything out of the ordinary.  Per ER history (her husband is currently not present) husband heard her fall in her room today and she was found to be having a seizure and EMS was called, she apparently had 5 more episodes with them.  ED Course: In the ER, she had several episodes of hyperventilation and generalized tonic clonic movements without eye deviation. She was somewhat postictal but arousable. ER MD spoke with Dr. Rory Percy who notes strong prior suspicion of pseudoseizure but recommends intpatient workup with MRI and video EEG as patient was not returning to baseline in the ER.  Review of Systems: Please see HPI for pertinent positives and negatives. A complete 10 system review of systems are otherwise negative.  Past Medical History:  Diagnosis Date  . Anxiety   . Arthritis    knees  . Asthma 06/07/11  . Asthma    prn inhaler  . Bipolar disorder ([place])   . Depression 06/07/11  . Depression   . Diabetes mellitus without complication ([place])   . Difficulty swallowing solids    due to GERD and slow motility of esophagus, per pt.    Marland Kitchen GERD (gastroesophageal reflux disease)   . H/O amenorrhea 01/27/04  . H/O varicella   . Hidradenitis 09/2014   bilateral axilla, left buttock, left groin  . Hypertension 06/07/11  . Hypertension    states under control with med., has been on med. x 6 yr.  . Irregular periods/menstrual cycles 05/22/04  . Multiple personality disorder ([place])   . Nasal congestion 09/27/2014  . Non-insulin dependent type 2 diabetes mellitus ([place])   . Obesity 06/07/11  . PCOS (polycystic ovarian syndrome) 06/07/11  . Schizophrenia ([place])   . Sleep apnea    does not use CPAP every night, per pt.  . Smoker 06/07/11  . Tachycardia    no cardiologist, per pt.   Past Surgical History:  Procedure Laterality Date  . adnoids  06/07/11  . HYDRADENITIS EXCISION Bilateral 09/28/2014   Procedure: EXCISION HIDRADENITIS BILATERAL AXILLARY;  Surgeon: Armandina Gemma, MD;  Location: [facility] [place];  Service: General;  Laterality: Bilateral;  . TONSILLECTOMY  06/07/11  . TONSILLECTOMY AND ADENOIDECTOMY  06/26/2000  . WISDOM TOOTH EXTRACTION  06/07/11  . WISDOM TOOTH EXTRACTION      Social History:  reports that she has been smoking cigarettes.  She has a 4.00 pack-year smoking history. She has never used smokeless tobacco. She reports that she does not drink alcohol or use drugs.   Allergies  Allergen Reactions  . Fentanyl Palpitations and Shortness Of Breath    Pt  states throat swells and heart races  . Penicillins Itching and Swelling    Facial swelling Has patient had a PCN reaction causing immediate rash, facial/tongue/throat swelling, SOB or lightheadedness with hypotension: Yes Has patient had a PCN reaction causing severe rash involving mucus membranes or skin necrosis: No Has patient had a PCN reaction that required hospitalization Yes Has patient had a PCN reaction occurring within the last 10 years: No If all of the above answers are "NO", then may proceed with Cephalosporin use.   . Triamterene  Shortness Of Breath and Other (See Comments)    Blisters on stomach  . Naproxen Itching    SKIN BURNING  . Ultram [Tramadol] Hives    Family History  Problem Relation Age of Onset  . Graves' disease Mother      Prior to Admission medications   Medication Sig Start Date End Date Taking? Authorizing Provider  ALPRAZolam Duanne Moron) 1 MG tablet Take 1 mg by mouth 4 (four) times daily.    Yes [provider]  aspirin EC 81 MG tablet Take 81 mg by mouth daily.   Yes [provider]  atenolol (TENORMIN) 50 MG tablet Take 50 mg by mouth every evening.  01/20/14  Yes [provider]  atorvastatin (LIPITOR) 80 MG tablet Take 80 mg by mouth every evening.    Yes [provider]  cycloSPORINE (RESTASIS) 0.05 % ophthalmic emulsion Place 1 drop into both eyes 2 (two) times daily.   Yes [provider]  diltiazem (CARDIZEM CD) 120 MG 24 hr capsule Take 1 capsule by mouth every evening.  03/13/16  Yes [provider]  lamoTRIgine (LAMICTAL) 25 MG tablet Take 50 mg by mouth 3 (three) times daily.   Yes [provider]  omeprazole (PRILOSEC) 40 MG capsule Take 40 mg by mouth 3 (three) times daily.    Yes [provider]  oxyCODONE-acetaminophen (PERCOCET) 10-325 MG tablet Take 1 tablet by mouth every 4 (four) hours as needed for pain.  10/07/15  Yes [provider]  temazepam (RESTORIL) 30 MG capsule Take 1 capsule by mouth at bedtime. 10/09/15  Yes [provider]  albuterol (PROVENTIL HFA;VENTOLIN HFA) 108 (90 BASE) MCG/ACT inhaler Inhale 2 puffs into the lungs every 6 (six) hours as needed for wheezing or shortness of breath. 01/01/14   Varney Biles, MD  albuterol (PROVENTIL) (2.5 MG/3ML) 0.083% nebulizer solution Take 2.5 mg by nebulization every 8 (eight) hours as needed. Shortness of breath    [provider]  doxycycline (VIBRAMYCIN) 100 MG capsule Take 1 capsule (100 mg total) by mouth 2 (two) times  daily. Patient not taking: Reported on 07/13/2016 04/19/16   Frederica Kuster, PA-C  glucose monitoring kit (FREESTYLE) monitoring kit 1 each by Does not apply route as needed for other. 10/20/15   Rancour, Annie Main, MD  ibuprofen (ADVIL,MOTRIN) 600 MG tablet Take 1 tablet (600 mg total) by mouth every 6 (six) hours as needed. Patient not taking: Reported on 07/13/2016 04/19/16   Frederica Kuster, PA-C  loperamide (IMODIUM) 2 MG capsule Take 1 capsule (2 mg total) by mouth 4 (four) times daily as needed for diarrhea or loose stools. Patient not taking: Reported on 07/13/2016 10/03/15   Ward, Delice Bison, DO  nystatin (MYCOSTATIN) 100000 UNIT/ML suspension Take 5 mLs (500,000 Units total) by mouth 4 (four) times daily. Patient not taking: Reported on 07/13/2016 10/20/15   Ezequiel Essex, MD  ondansetron (ZOFRAN ODT) 4 MG disintegrating tablet Take 1 tablet (4  mg total) by mouth every 8 (eight) hours as needed for nausea or vomiting. Patient not taking: Reported on 07/13/2016 10/03/15   Ward, Delice Bison, DO    Physical Exam: BP 111/64   Pulse 85   Temp 99 F (37.2 C) (Oral)   Resp 16   Ht 5' 9.5" (1.765 m)   Wt 99.8 kg (220 lb)   SpO2 100%   BMI 32.02 kg/m   General:  Alert, oriented, calm, in no acute distress Eyes: EOMI, clear conjuctivae, white sclerea Neck: supple, no masses, trachea mildline  Cardiovascular: RRR, no murmurs or rubs, no peripheral edema  Respiratory: clear to auscultation bilaterally, no wheezes, no crackles  Abdomen: soft, nontender, nondistended, normal bowel tones heard  Skin: dry, no rashes  Musculoskeletal: no joint effusions, normal range of motion  Psychiatric: appropriate affect, normal speech  Neurologic: extraocular muscles intact, clear speech, moving all extremities with intact sensorium            Labs on Admission:  Basic Metabolic Panel: Recent Labs  Lab 10/07/17 1843 10/07/17 1853  NA 135 138  K 3.9 3.7  CL 105 108  CO2 17*  --   GLUCOSE 293* 306*  BUN  8 7  CREATININE 0.72 0.40*  CALCIUM 9.2  --    Liver Function Tests: Recent Labs  Lab 10/07/17 1843  AST 41  ALT 44  ALKPHOS 49  BILITOT 0.7  PROT 6.6  ALBUMIN 4.0   No results for input(s): LIPASE, AMYLASE in the last 168 hours. No results for input(s): AMMONIA in the last 168 hours. CBC: Recent Labs  Lab 10/07/17 1843 10/07/17 1853  WBC 8.4  --   NEUTROABS 4.8  --   HGB 15.4* 14.3  HCT 42.5 42.0  MCV 86.0  --   PLT 265  --    Cardiac Enzymes: No results for input(s): CKTOTAL, CKMB, CKMBINDEX, TROPONINI in the last 168 hours.  BNP (last 3 results) No results for input(s): BNP in the last 8760 hours.  ProBNP (last 3 results) No results for input(s): PROBNP in the last 8760 hours.  CBG: No results for input(s): GLUCAP in the last 168 hours.  Radiological Exams on Admission: Ct Head Wo Contrast  Result Date: 10/07/2017 CLINICAL DATA:  Seizure EXAM: CT HEAD WITHOUT CONTRAST TECHNIQUE: Contiguous axial images were obtained from the base of the skull through the vertex without intravenous contrast. COMPARISON:  07/13/2016 FINDINGS: Brain: No evidence of acute infarction, hemorrhage, hydrocephalus, extra-axial collection or mass lesion/mass effect. Vascular: No hyperdense vessel or unexpected calcification. Skull: Normal. Negative for fracture or focal lesion. Sinuses/Orbits: Near complete opacification of left maxillary sinus. No acute orbital abnormality. Other: Calcified scalp mass in the right posterior parietal region. IMPRESSION: Negative non contrasted CT appearance of the brain Electronically Signed   By: Donavan Foil M.D.   On: 10/07/2017 20:36    EKG: Independently reviewed.    Assessment/Plan Present on Admission: . Hyperglycemia . GERD (gastroesophageal reflux disease)  32 year old female with history of DM, seizures, medication noncompliance and prior strong concern for psuedoseizures being admitted for evaluation of seizure activity multiple times  today.  Seizure - given lack of postictal state currently and completely normal status even after concern for multiple seizures today, there is strong possibility of pseudoseizure. However, given longstanding problem and lack of followup as well as the fact she took some time to return to baseline, will admit for further workup and evaluation. Note ER MD at [facility] did  discuss with Dr. Lelon Mast. Patient states she is on Lamictal at home but we won't continue this at this time or add new AED as she will undergo overnight EEG. Neurology needs to be contacted in the morning for formal consult once she is at [facility], will hold off on MRI brain as well and defer to neurology.   Depression   Hyperglycemia due to DM - check A1c, add SSI and diabetic diet when eating.   Insomnia   GERD (gastroesophageal reflux disease)   Chronic pain - continue home Percocet   Chronic Anxiety  DVT prophylaxis: Lovenox   Code Status: FULL   Family Communication: None present, discussed with patient.   Disposition Plan: Home at [place] called: ER MD discussed with Dr. Rory Percy, will need to notify neurology of her admission to [facility] in AM.   Admission status: Observation   Time spent: 36 minutes  Kyen Taite Marry Guan MD [facility] Pager [phone]  If 7PM-7AM, please contact night-coverage [email or web address] Password [facility]  10/07/2017, 9:46 PM

## 2017-10-07 NOTE — ED Triage Notes (Signed)
Ems called for seizure, initially post ictal, patient AX0, while in route to hospital, patient began seizing, total 7.5mg  versed given. Hx epilepsy. Patient compliant with medications. Patient awake, alert to self, when asked year patient states, "give me a second."  20 left AC  BP: 152/90 HR:120 RR: 16 CBG: 237

## 2017-10-07 NOTE — ED Notes (Signed)
ED TO INPATIENT HANDOFF REPORT  Name/Age/Gender Rebecca Moyer 32 y.o. female  Code Status    Code Status Orders  (From admission, onward)        Start     Ordered   10/07/17 2133  Full code  Continuous     10/07/17 2134    Code Status History    This patient has a current code status but no historical code status.      Home/SNF/Other Home  Chief Complaint Seizure  Level of Care/Admitting Diagnosis ED Disposition    ED Disposition Condition [facility]: [place] [[id number]]  Level of Care: Med-Surg [16]  Diagnosis: Seizure [facility]) [[id number]]  Admitting Physician: Hollice Gong, MIR [facility] [[id number]]  Attending Physician: Hollice Gong, MIR MOHAMMED [[id number]]  PT Class (Do Not Modify): Observation [104]  PT Acc Code (Do Not Modify): Observation [10022]       Medical History Past Medical History:  Diagnosis Date  . Anxiety   . Arthritis    knees  . Asthma 06/07/11  . Asthma    prn inhaler  . Bipolar disorder ([place])   . Depression 06/07/11  . Depression   . Diabetes mellitus without complication ([place])   . Difficulty swallowing solids    due to GERD and slow motility of esophagus, per pt.  Marland Kitchen GERD (gastroesophageal reflux disease)   . H/O amenorrhea 01/27/04  . H/O varicella   . Hidradenitis 09/2014   bilateral axilla, left buttock, left groin  . Hypertension 06/07/11  . Hypertension    states under control with med., has been on med. x 6 yr.  . Irregular periods/menstrual cycles 05/22/04  . 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 Nasal congestion 09/27/2014  . Non-insulin dependent type 2 diabetes mellitus ([place])   . Obesity 06/07/11  . PCOS (polycystic ovarian syndrome) 06/07/11  . Schizophrenia ([place])   . Sleep apnea    does not use CPAP every night, per pt.  . Smoker 06/07/11  . Tachycardia    no cardiologist, per pt.    Allergies Allergies  Allergen Reactions  . Fentanyl Palpitations and Shortness Of Breath    Pt states  throat swells and heart races  . Penicillins Itching and Swelling    Facial swelling Has patient had a PCN reaction causing immediate rash, facial/tongue/throat swelling, SOB or lightheadedness with hypotension: Yes Has patient had a PCN reaction causing severe rash involving mucus membranes or skin necrosis: No Has patient had a PCN reaction that required hospitalization Yes Has patient had a PCN reaction occurring within the last 10 years: No If all of the above answers are "NO", then may proceed with Cephalosporin use.   . Triamterene Shortness Of Breath and Other (See Comments)    Blisters on stomach  . Naproxen Itching    SKIN BURNING  . Ultram [Tramadol] Hives    IV Location/Drains/Wounds Patient Lines/Drains/Airways Status   Active Line/Drains/Airways    Name:   Placement date:   Placement time:   Site:   Days:   Peripheral IV 10/07/17 Left Arm   10/07/17    1855    Arm   less than 1   Incision (Closed) 09/28/14 Axilla Left   09/28/14    1146     1105   Incision (Closed) 09/28/14 Axilla Right   09/28/14    1146     1105          Labs/Imaging Results for orders placed or performed  during the hospital encounter of 10/07/17 (from the past 48 hour(s))  CBC with Differential/Platelet     Status: Abnormal   Collection Time: 10/07/17  6:43 PM  Result Value Ref Range   WBC 8.4 4.0 - 10.5 K/uL   RBC 4.94 3.87 - 5.11 MIL/uL   Hemoglobin 15.4 (H) 12.0 - 15.0 g/dL   HCT 42.5 36.0 - 46.0 %   MCV 86.0 78.0 - 100.0 fL   MCH 31.2 26.0 - 34.0 pg   MCHC 36.2 (H) 30.0 - 36.0 g/dL   RDW 12.6 11.5 - 15.5 %   Platelets 265 150 - 400 K/uL   Neutrophils Relative % 57 %   Neutro Abs 4.8 1.7 - 7.7 K/uL   Lymphocytes Relative 36 %   Lymphs Abs 3.0 0.7 - 4.0 K/uL   Monocytes Relative 4 %   Monocytes Absolute 0.3 0.1 - 1.0 K/uL   Eosinophils Relative 2 %   Eosinophils Absolute 0.2 0.0 - 0.7 K/uL   Basophils Relative 1 %   Basophils Absolute 0.0 0.0 - 0.1 K/uL    Comment: Performed at  [facility], [place] [address], [place], [place] [address]  Comprehensive metabolic panel     Status: Abnormal   Collection Time: 10/07/17  6:43 PM  Result Value Ref Range   Sodium 135 135 - 145 mmol/L   Potassium 3.9 3.5 - 5.1 mmol/L   Chloride 105 98 - 111 mmol/L   CO2 17 (L) 22 - 32 mmol/L   Glucose, Bld 293 (H) 70 - 99 mg/dL   BUN 8 6 - 20 mg/dL   Creatinine, Ser 0.72 0.44 - 1.00 mg/dL   Calcium 9.2 8.9 - 10.3 mg/dL   Total Protein 6.6 6.5 - 8.1 g/dL   Albumin 4.0 3.5 - 5.0 g/dL   AST 41 15 - 41 U/L   ALT 44 0 - 44 U/L   Alkaline Phosphatase 49 38 - 126 U/L   Total Bilirubin 0.7 0.3 - 1.2 mg/dL   GFR calc non Af Amer >60 >60 mL/min   GFR calc Af Amer >60 >60 mL/min    Comment: (NOTE) The eGFR has been calculated using the CKD EPI equation. This calculation has not been validated in all clinical situations. eGFR's persistently <60 mL/min signify possible Chronic Kidney Disease.    Anion gap 13 5 - 15    Comment: Performed at [facility], [place] [address], [place], [place] [address]  Ethanol     Status: None   Collection Time: 10/07/17  6:48 PM  Result Value Ref Range   Alcohol, Ethyl (B) <10 <10 mg/dL    Comment: (NOTE) Lowest detectable limit for serum alcohol is 10 mg/dL. For medical purposes only. Performed at [facility], [place] [address], [place], [place] [address]   Salicylate level     Status: None   Collection Time: 10/07/17  6:48 PM  Result Value Ref Range   Salicylate Lvl <5.8 2.8 - 30.0 mg/dL    Comment: Performed at [facility], [place] [address] [place], [place] [address]  Acetaminophen level     Status: Abnormal   Collection Time: 10/07/17  6:48 PM  Result Value Ref Range   Acetaminophen (Tylenol), Serum <10 (L) 10 - 30 ug/mL    Comment: (NOTE) Therapeutic concentrations vary significantly. A range of 10-30 ug/mL  may be an effective concentration for many patients. However, some   are best treated at concentrations outside of this  range. Acetaminophen concentrations >150 ug/mL at 4 hours after ingestion  and >50 ug/mL at 12 hours after ingestion are often associated with  toxic reactions. Performed at [facility], [place] [address], [place], [place] [address]   I-stat troponin, ED     Status: None   Collection Time: 10/07/17  6:51 PM  Result Value Ref Range   Troponin i, poc 0.00 0.00 - 0.08 ng/mL   Comment 3            Comment: Due to the release kinetics of cTnI, a negative result within the first hours of the onset of symptoms does not rule out myocardial infarction with certainty. If myocardial infarction is still suspected, repeat the test at appropriate intervals.   I-Stat beta hCG blood, ED     Status: None   Collection Time: 10/07/17  6:52 PM  Result Value Ref Range   I-stat hCG, quantitative <5.0 <5 mIU/mL   Comment 3            Comment:   GEST. AGE      CONC.  (mIU/mL)   <=1 WEEK        5 - 50     2 WEEKS       50 - 500     3 WEEKS       100 - 10,000     4 WEEKS     1,000 - 30,000        FEMALE AND NON-PREGNANT FEMALE:     LESS THAN 5 mIU/mL   I-stat chem 8, ed     Status: Abnormal   Collection Time: 10/07/17  6:53 PM  Result Value Ref Range   Sodium 138 135 - 145 mmol/L   Potassium 3.7 3.5 - 5.1 mmol/L   Chloride 108 98 - 111 mmol/L   BUN 7 6 - 20 mg/dL   Creatinine, Ser 0.40 (L) 0.44 - 1.00 mg/dL   Glucose, Bld 306 (H) 70 - 99 mg/dL   Calcium, Ion 1.15 1.15 - 1.40 mmol/L   TCO2 17 (L) 22 - 32 mmol/L   Hemoglobin 14.3 12.0 - 15.0 g/dL   HCT 42.0 36.0 - 46.0 %   Ct Head Wo Contrast  Result Date: 10/07/2017 CLINICAL DATA:  Seizure EXAM: CT HEAD WITHOUT CONTRAST TECHNIQUE: Contiguous axial images were obtained from the base of the skull through the vertex without intravenous contrast. COMPARISON:  07/13/2016 FINDINGS: Brain: No evidence of acute infarction, hemorrhage, hydrocephalus, extra-axial collection or mass  lesion/mass effect. Vascular: No hyperdense vessel or unexpected calcification. Skull: Normal. Negative for fracture or focal lesion. Sinuses/Orbits: Near complete opacification of left maxillary sinus. No acute orbital abnormality. Other: Calcified scalp mass in the right posterior parietal region. IMPRESSION: Negative non contrasted CT appearance of the brain Electronically Signed   By: Donavan Foil M.D.   On: 10/07/2017 20:36    Pending Labs Unresulted Labs (From admission, onward)   Start     Ordered   10/14/17 0500  Creatinine, serum  (enoxaparin (LOVENOX)    CrCl >/= 30 ml/min)  Weekly,   R    Comments:  while on enoxaparin therapy    10/07/17 2134   10/07/17 2131  Hemoglobin A1c  Once,   R    Comments:  To assess prior glycemic control    10/07/17 2130   10/07/17 2131  HIV antibody (Routine Testing)  Once,   R     10/07/17 2134   10/07/17 2131  CBC  (  enoxaparin (LOVENOX)    CrCl >/= 30 ml/min)  Once,   R    Comments:  Baseline for enoxaparin therapy IF NOT ALREADY DRAWN.  Notify MD if PLT < 100 K.    10/07/17 2134   10/07/17 2131  Creatinine, serum  (enoxaparin (LOVENOX)    CrCl >/= 30 ml/min)  Once,   R    Comments:  Baseline for enoxaparin therapy IF NOT ALREADY DRAWN.    10/07/17 2134   10/07/17 1852  Urinalysis, Routine w reflex microscopic  Once,   R     10/07/17 1851   10/07/17 1852  Rapid urine drug screen (hospital performed)  STAT,   R     10/07/17 1851      Vitals/Pain Today's Vitals   10/07/17 2028 10/07/17 2030 10/07/17 2101 10/07/17 2136  BP:  139/85 115/70 111/64  Pulse: (!) 103 82 84 85  Resp:  _0 Temp:      TempSrc:      SpO2:  100% 99% 100%  Weight:      Height:      PainSc:        Isolation Precautions No active isolations  Medications Medications  insulin aspart (novoLOG) injection 0-15 Units (has no administration in time range)  enoxaparin (LOVENOX) injection 40 mg (has no administration in time range)  0.9 %  sodium chloride  infusion (has no administration in time range)  acetaminophen (TYLENOL) tablet 650 mg (has no administration in time range)    Or  acetaminophen (TYLENOL) suppository 650 mg (has no administration in time range)  ondansetron (ZOFRAN) tablet 4 mg (has no administration in time range)    Or  ondansetron (ZOFRAN) injection 4 mg (has no administration in time range)  ALPRAZolam (XANAX) tablet 1 mg (has no administration in time range)  atenolol (TENORMIN) tablet 50 mg (has no administration in time range)  atorvastatin (LIPITOR) tablet 80 mg (has no administration in time range)  diltiazem (CARDIZEM CD) 24 hr capsule 120 mg (has no administration in time range)  pantoprazole (PROTONIX) EC tablet 40 mg (has no administration in time range)  oxyCODONE-acetaminophen (PERCOCET) 10-325 MG per tablet 1 tablet (has no administration in time range)  LORazepam (ATIVAN) injection 2 mg (2 mg Intravenous Given 10/07/17 1850)  diazepam (VALIUM) injection 5 mg (5 mg Intravenous Given 10/07/17 1855)  sodium chloride 0.9 % bolus 1,000 mL (1,000 mLs Intravenous New Bag/Given 10/07/17 1857)    Mobility walks with person assist

## 2017-10-07 NOTE — ED Notes (Signed)
Attempted to give report. Receiving Rn requested "5 more minutes and will call back"

## 2017-10-07 NOTE — ED Notes (Signed)
Bed: RESA Expected date:  Expected time:  Means of arrival:  Comments: EMS-SZ

## 2017-10-07 NOTE — ED Notes (Signed)
This Probation officer in room-patient quiet with eyes closed-while adjusting EKG leads-patient demonstrated seizure type activity with generalized bucking of upper body and kicking legs and hyperventilating lasting 30 seconds.

## 2017-10-07 NOTE — ED Notes (Signed)
Patient states she is being admitted-patient is currently awake and alert visiting with family.

## 2017-10-07 NOTE — Progress Notes (Signed)
Pt arrived via stretcher EMS. Oriented to room. Call bell in reach. Bed locked and alarm set. Medications given. Refused Lovenox and educated. Now on the phone with family.

## 2017-10-07 NOTE — Progress Notes (Signed)
Spoke with Dr. Darl Householder over the phone and reviewed chart. 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  EEG and MRI along with possible long term EEG were recommended in the past, but I do not see any records of them being completed. I recommended continuing outpatient care if patient back to baseline and start AED if being discharged. Alternatively, will consider inpatient work up, which might require LTM EEG, decision on which will be made after clinical eval and routine EEG since patient not in status per report from [facility]. If admission is considered, do not start AED for now. LTM EEG facilities only available at [facility], hence recommended admission to [facility] if decision is made to admit. Neurology will evaluate patient once at [facility]. Please notify neurology of patient arrival at [facility]. -- Amie Portland, MD [facility] Pager: [phone] If 7pm to 7am, please call on call as listed on AMION.

## 2017-10-07 NOTE — ED Notes (Signed)
Neurologist at bedside. 

## 2017-10-07 NOTE — ED Notes (Addendum)
Pt transported to CT ?

## 2017-10-07 NOTE — ED Provider Notes (Signed)
[facility] DEPT Provider Note   CSN: [id number] Arrival date & time: 10/07/17  1824     History   Chief Complaint Chief Complaint  Patient presents with  . Seizures    HPI MARLIE KUENNEN is a 32 y.o. female hx of DM, HTN, possible seizures here presenting with multiple seizure like activity.  Per the family, patient has multiple seizure activity over the last several months.  Patient was seen several months ago and was referred to neurology.  Patient is not currently on any seizure medicines.  Patient actually saw neurology several months ago and recommended 24-hour EEG monitoring but has not gotten it yet.  Today apparently the husband heard a thump and noticed that she seemed to have a seizure activity.  EMS got there and she was noted to be postictal and then had about 5 seizures witnessed by EMS.  However there was no obvious postictal period and she was given 7.5 mg of Versed prior to arrival.  I was called in the room when she started having a seizure.  Patient seemed to be hyperventilating during those episodes and had tonic-clonic jerking-like activity with no eye deviation.   The history is provided by the patient.   Level V caveat- AMS, seizure   Past Medical History:  Diagnosis Date  . Anxiety   . Arthritis    knees  . Asthma 06/07/11  . Asthma    prn inhaler  . Bipolar disorder ([place])   . Depression 06/07/11  . Depression   . Diabetes mellitus without complication ([place])   . Difficulty swallowing solids    due to GERD and slow motility of esophagus, per pt.  Marland Kitchen GERD (gastroesophageal reflux disease)   . H/O amenorrhea 01/27/04  . H/O varicella   . Hidradenitis 09/2014   bilateral axilla, left buttock, left groin  . Hypertension 06/07/11  . Hypertension    states under control with med., has been on med. x 6 yr.  . Irregular periods/menstrual cycles 05/22/04  . 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 Nasal congestion 09/27/2014  .  Non-insulin dependent type 2 diabetes mellitus ([place])   . Obesity 06/07/11  . PCOS (polycystic ovarian syndrome) 06/07/11  . Schizophrenia ([place])   . Sleep apnea    does not use CPAP every night, per pt.  . Smoker 06/07/11  . Tachycardia    no cardiologist, per pt.    Patient Active Problem List   Diagnosis Date Noted  . Hidradenitis 09/28/2014  . 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 DEPRESSIVE DISORDER, NOS 05/09/2006  . ATTENTION DEFICIT, W/HYPERACTIVITY 05/09/2006    Past Surgical History:  Procedure Laterality Date  . adnoids  06/07/11  . HYDRADENITIS EXCISION Bilateral 09/28/2014   Procedure: EXCISION HIDRADENITIS BILATERAL AXILLARY;  Surgeon: Armandina Gemma, MD;  Location: [facility];  Service: General;  Laterality: Bilateral;  . TONSILLECTOMY  06/07/11  . TONSILLECTOMY AND ADENOIDECTOMY  06/26/2000  . WISDOM TOOTH EXTRACTION  06/07/11  . WISDOM TOOTH EXTRACTION       OB History    Gravida  0   Para  0   Term  0   Preterm  0   AB  0   Living        SAB  0   TAB  0   Ectopic  0   Multiple      Live Births  Home Medications    Prior to Admission medications   Medication Sig Start Date End Date Taking? Authorizing Provider  albuterol (PROVENTIL HFA;VENTOLIN HFA) 108 (90 BASE) MCG/ACT inhaler Inhale 2 puffs into the lungs every 6 (six) hours as needed for wheezing or shortness of breath. 01/01/14   Varney Biles, MD  albuterol (PROVENTIL) (2.5 MG/3ML) 0.083% nebulizer solution Take 2.5 mg by nebulization every 8 (eight) hours as needed. Shortness of breath    [provider]  ALPRAZolam (XANAX) 1 MG tablet Take 1 mg by mouth 3 (three) times daily.     [provider]  aspirin EC 81 MG tablet Take 81 mg by mouth daily.    [provider]  atenolol (TENORMIN) 50 MG tablet Take 50 mg by mouth every evening.  01/20/14   [provider]  atorvastatin (LIPITOR) 80 MG tablet Take 80 mg by mouth  daily.    [provider]  cycloSPORINE (RESTASIS) 0.05 % ophthalmic emulsion Place 1 drop into both eyes 2 (two) times daily.    [provider]  diltiazem (CARDIZEM CD) 120 MG 24 hr capsule Take 1 capsule by mouth daily. 03/13/16   [provider]  diltiazem (DILACOR XR) 120 MG 24 hr capsule Take 120 mg by mouth every evening.     [provider]  doxycycline (VIBRAMYCIN) 100 MG capsule Take 1 capsule (100 mg total) by mouth 2 (two) times daily. Patient not taking: Reported on 07/13/2016 04/19/16   Frederica Kuster, PA-C  glucose monitoring kit (FREESTYLE) monitoring kit 1 each by Does not apply route as needed for other. 10/20/15   Rancour, Annie Main, MD  ibuprofen (ADVIL,MOTRIN) 600 MG tablet Take 1 tablet (600 mg total) by mouth every 6 (six) hours as needed. Patient not taking: Reported on 07/13/2016 04/19/16   Frederica Kuster, PA-C  insulin detemir (LEVEMIR) 100 UNIT/ML injection Inject 20 Units into the skin at bedtime.    [provider]  Liraglutide (VICTOZA) 18 MG/3ML SOPN Inject 1.8 mg into the skin 2 (two) times daily.     [provider]  loperamide (IMODIUM) 2 MG capsule Take 1 capsule (2 mg total) by mouth 4 (four) times daily as needed for diarrhea or loose stools. Patient not taking: Reported on 07/13/2016 10/03/15   Ward, Delice Bison, DO  nystatin (MYCOSTATIN) 100000 UNIT/ML suspension Take 5 mLs (500,000 Units total) by mouth 4 (four) times daily. Patient not taking: Reported on 07/13/2016 10/20/15   Rancour, Annie Main, MD  Omega-3 Fatty Acids (FISH OIL) 1000 MG CAPS Take 1 capsule by mouth daily.    [provider]  omeprazole (PRILOSEC) 40 MG capsule Take 40 mg by mouth 3 (three) times daily.     [provider]  ondansetron (ZOFRAN ODT) 4 MG disintegrating tablet Take 1 tablet (4 mg total) by mouth every 8 (eight) hours as needed for nausea or vomiting. Patient not taking: Reported on 07/13/2016 10/03/15   Ward, Delice Bison, DO    oxyCODONE-acetaminophen (PERCOCET) 10-325 MG tablet Take 1 tablet by mouth 3 (three) times daily.  10/07/15   [provider]  temazepam (RESTORIL) 30 MG capsule Take 1 capsule by mouth at bedtime. 10/09/15   [provider]    Family History Family History  Problem Relation Age of Onset  . Graves' disease Mother     Social History Social History   Tobacco Use  . Smoking status: Current Every Day Smoker    Packs/day: 0.50    Years:  8.00    Pack years: 4.00    Types: Cigarettes  . Smokeless tobacco: Never Used  Substance Use Topics  . Alcohol use: No  . Drug use: No     Allergies   Fentanyl; Penicillins; Triamterene; Naproxen; and Ultram [tramadol]   Review of Systems Review of Systems  Neurological: Positive for seizures.  All other systems reviewed and are negative.    Physical Exam Updated Vital Signs BP 134/62   Pulse 95   Temp 99 F (37.2 C) (Oral)   Resp 19   Ht 5' 9.5" (1.765 m)   Wt 99.8 kg (220 lb)   SpO2 99%   BMI 32.02 kg/m   Physical Exam  Constitutional:  Altered, unable to answer many questions   HENT:  Head: Normocephalic.  Eyes: Pupils are equal, round, and reactive to light. Conjunctivae and EOM are normal.  Neck: Normal range of motion. Neck supple.  Cardiovascular: Normal rate, regular rhythm and normal heart sounds.  Pulmonary/Chest: Effort normal and breath sounds normal. No stridor. No respiratory distress. She has no wheezes.  Abdominal: Soft. Bowel sounds are normal. She exhibits no distension. There is no tenderness.  Musculoskeletal: Normal range of motion.  Neurological:  Altered. No eye deviations. Pupils reactive bilaterally. Jerking movements noted but no obvious clonus   Skin: Skin is warm.  Psychiatric:  Unable   Nursing note and vitals reviewed.    ED Treatments / Results  Labs (all labs ordered are listed, but only abnormal results are displayed) Labs Reviewed  CBC WITH DIFFERENTIAL/PLATELET -  Abnormal; Notable for the following components:      Result Value   Hemoglobin 15.4 (*)    MCHC 36.2 (*)    All other components within normal limits  COMPREHENSIVE METABOLIC PANEL - Abnormal; Notable for the following components:   CO2 17 (*)    Glucose, Bld 293 (*)    All other components within normal limits  ACETAMINOPHEN LEVEL - Abnormal; Notable for the following components:   Acetaminophen (Tylenol), Serum <10 (*)    All other components within normal limits  I-STAT CHEM 8, ED - Abnormal; Notable for the following components:   Creatinine, Ser 0.40 (*)    Glucose, Bld 306 (*)    TCO2 17 (*)    All other components within normal limits  ETHANOL  SALICYLATE LEVEL  URINALYSIS, ROUTINE W REFLEX MICROSCOPIC  RAPID URINE DRUG SCREEN, HOSP PERFORMED  CBG MONITORING, ED  I-STAT BETA HCG BLOOD, ED ([facility], [facility], [facility] ONLY)  I-STAT TROPONIN, ED    EKG EKG Interpretation  Date/Time:  Monday October 07 2017 18:35:00 EDT Ventricular Rate:  98 PR Interval:    QRS Duration: 106 QT Interval:  387 QTC Calculation: 495 R Axis:   71 Text Interpretation:  Sinus rhythm Borderline prolonged QT interval No significant change since last tracing Confirmed by Wandra Arthurs [phone]) on 10/07/2017 6:50:47 PM   Radiology No results found.  Procedures Procedures (including critical care time)  CRITICAL CARE Performed by: Wandra Arthurs   Total critical care time: 30 minutes  Critical care time was exclusive of separately billable procedures and treating other patients.  Critical care was necessary to treat or prevent imminent or life-threatening deterioration.  Critical care was time spent personally by me on the following activities: development of treatment plan with patient and/or surrogate as well as nursing, discussions with consultants, evaluation of patient's response to treatment, examination of patient, obtaining history from patient or surrogate, ordering and  performing treatments and  interventions, ordering and review of laboratory studies, ordering and review of radiographic studies, pulse oximetry and re-evaluation of patient's condition.   Medications Ordered in ED Medications  LORazepam (ATIVAN) injection 2 mg (2 mg Intravenous Given 10/07/17 1850)  diazepam (VALIUM) injection 5 mg (5 mg Intravenous Given 10/07/17 1855)  sodium chloride 0.9 % bolus 1,000 mL (1,000 mLs Intravenous New Bag/Given 10/07/17 1857)     Initial Impression / Assessment and Plan / ED Course  I have reviewed the triage vital signs and the nursing notes.  Pertinent labs & imaging results that were available during my care of the patient were reviewed by me and considered in my medical decision making (see chart for details).     BRAELEIGH PYPER is a 32 y.o. female here with seizure like activity. I think seizure vs pseudoseizure. Given versed prior to arrival with no benefit. Will try ativan and valium. But she has no eye deviation with it. Suspect partial seizure vs pseudoseizure.   8:35 PM Labs showed glucose 290. Tox negative. Has multiple seizure like activity but she wakes up in the middle and is hyperventilating. I talked to Dr. Rory Percy from neurology. He reviewed notes from outpatient neuro and recommend either dc home with keppra and outpatient EEG or admission for 24 hr video EEG. Since she is not back to baseline, will transfer to [facility] for EEG. He doesn't want any antiepileptics currently since she will be getting EEG at [facility].     Final Clinical Impressions(s) / ED Diagnoses   Final diagnoses:  None    ED Discharge Orders    None       Drenda Freeze, MD 10/07/17 2039

## 2017-10-07 NOTE — Consult Note (Addendum)
Neurology Consultation  Reason for Consult: Seizures Referring Physician: Dr. Darl Householder  CC: Seizure-like activity  History is obtained from: Patient, chart review  HPI: Rebecca Moyer is a 32 y.o. 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 She says that she has been having seizures flurry since Sunday with multiple seizures.  She does not recall any of these events.  She says that her husband heard her fall and when they went to see her, she was actively seizing with an episode that lasted a few minutes.  She completely came back to baseline with some complaints of headache after that.  She had multiple episodes in the emergency room concerning for seizures.  I was consulted over the phone from [facility] regarding this patient.  Chart review revealed that she has seen Dr. Lavell Anchors at [facility] neurology, who strongly suspected nonepileptic etiology and recommended EEG, possible long-term EEG and MRI of the brain-none of which have been completed per the patient. I recommended that patient, if back to baseline, can be started on an antiepileptic and sent back to the outpatient neurologist for outpatient evaluation or else, can be admitted inpatient, routine EEG performed and patient, consider long-term EEG following clinical evaluation and clinical course and not be started on antiepileptic if considering inpatient admission. The ED provider discussed with the family and the patient decided to stay for inpatient evaluation. She denies any preceding illnesses or sicknesses.  She reports intermittent compliance to her medications.  She denies any focal tingling numbness or weakness.  She complains  of mild headache at this time.  She also has history of migraines according to her.  She endorses photophobia and phonophobia at this time.  She denied any shortness of breath or chest pain. ER description of episodes was arching of the back, generalized shaking, and hyperventilating afterwards following complete resolution of symptoms.  She did receive some benzodiazepines in the emergency room and was somnolent for a while after that.  On my evaluation, patient was awake alert oriented x3.  No family member at bedside at the time of my evaluation.  Patient was being prepared for transport via [facility] to [facility], where she has assigned bed already.  ROS: ROS was performed and is negative except as noted in the HPI.   Past Medical History:  Diagnosis Date  . Anxiety   . Arthritis    knees  . Asthma 06/07/11  . Asthma    prn inhaler  . Bipolar disorder ([place])   . Depression 06/07/11  . Depression   . Diabetes mellitus without complication ([place])   . Difficulty swallowing solids    due to GERD and slow motility of esophagus, per pt.  Marland Kitchen GERD (gastroesophageal reflux disease)   . H/O amenorrhea 01/27/04  . H/O varicella   . Hidradenitis 09/2014   bilateral axilla, left buttock, left groin  . Hypertension 06/07/11  . Hypertension    states under control with med., has been on med. x 6 yr.  . Irregular periods/menstrual cycles 05/22/04  . Multiple personality disorder ([place])   . Nasal congestion 09/27/2014  . Non-insulin dependent type 2 diabetes mellitus ([place])   . Obesity 06/07/11  . PCOS (polycystic ovarian syndrome) 06/07/11  . Schizophrenia ([place])   .  Sleep apnea    does not use CPAP every night, per pt.  . Smoker 06/07/11  . Tachycardia    no cardiologist, per pt.    Family History  Problem Relation Age of Onset  . Graves' disease Mother    Social History:   reports that she has been smoking cigarettes.  She has a 4.00 pack-year smoking history. She has never used  smokeless tobacco. She reports that she does not drink alcohol or use drugs.  Medications  Current Facility-Administered Medications:  .  acetaminophen (TYLENOL) tablet 650 mg, 650 mg, Oral, Q6H PRN **OR** acetaminophen (TYLENOL) suppository 650 mg, 650 mg, Rectal, Q6H PRN, Hollice Gong, Mir Mohammed, MD .  ALPRAZolam Duanne Moron) tablet 1 mg, 1 mg, Oral, Q6H, Hollice Gong, Mir Mohammed, MD .  atenolol (TENORMIN) tablet 50 mg, 50 mg, Oral, QPM, Ikramullah, Mir Mohammed, MD .  atorvastatin (LIPITOR) tablet 80 mg, 80 mg, Oral, QPM, Ikramullah, Mir Mohammed, MD .  diltiazem (CARDIZEM CD) 24 hr capsule 120 mg, 120 mg, Oral, QPM, Ikramullah, Mir Mohammed, MD .  enoxaparin (LOVENOX) injection 40 mg, 40 mg, Subcutaneous, Q24H, Ikramullah, Mir Mohammed, MD .  insulin aspart (novoLOG) injection 0-15 Units, 0-15 Units, Subcutaneous, Q4H, Ikramullah, Mir Mohammed, MD .  ondansetron (ZOFRAN) tablet 4 mg, 4 mg, Oral, Q6H PRN **OR** ondansetron (ZOFRAN) injection 4 mg, 4 mg, Intravenous, Q6H PRN, Hollice Gong, Mir Mohammed, MD .  oxyCODONE-acetaminophen (PERCOCET/ROXICET) 5-325 MG per tablet 1 tablet, 1 tablet, Oral, Q4H PRN **AND** oxyCODONE (Oxy IR/ROXICODONE) immediate release tablet 5 mg, 5 mg, Oral, Q4H PRN, Hollice Gong, Mir Mohammed, MD .  pantoprazole (PROTONIX) EC tablet 40 mg, 40 mg, Oral, Daily, Hollice Gong, Mir Earlie Server, MD  Current Outpatient Medications:  .  ALPRAZolam (XANAX) 1 MG tablet, Take 1 mg by mouth 4 (four) times daily. , Disp: , Rfl:  .  aspirin EC 81 MG tablet, Take 81 mg by mouth daily., Disp: , Rfl:  .  atenolol (TENORMIN) 50 MG tablet, Take 50 mg by mouth every evening. , Disp: , Rfl: 5 .  atorvastatin (LIPITOR) 80 MG tablet, Take 80 mg by mouth every evening. , Disp: , Rfl:  .  cycloSPORINE (RESTASIS) 0.05 % ophthalmic emulsion, Place 1 drop into both eyes 2 (two) times daily., Disp: , Rfl:  .  diltiazem (CARDIZEM CD) 120 MG 24 hr capsule, Take 1 capsule by mouth every evening. , Disp: , Rfl:  3 .  lamoTRIgine (LAMICTAL) 25 MG tablet, Take 50 mg by mouth 3 (three) times daily., Disp: , Rfl:  .  omeprazole (PRILOSEC) 40 MG capsule, Take 40 mg by mouth 3 (three) times daily. , Disp: , Rfl:  .  oxyCODONE-acetaminophen (PERCOCET) 10-325 MG tablet, Take 1 tablet by mouth every 4 (four) hours as needed for pain. , Disp: , Rfl: 0 .  temazepam (RESTORIL) 30 MG capsule, Take 1 capsule by mouth at bedtime., Disp: , Rfl: 0 .  albuterol (PROVENTIL HFA;VENTOLIN HFA) 108 (90 BASE) MCG/ACT inhaler, Inhale 2 puffs into the lungs every 6 (six) hours as needed for wheezing or shortness of breath., Disp: 1 Inhaler, Rfl: 2 .  albuterol (PROVENTIL) (2.5 MG/3ML) 0.083% nebulizer solution, Take 2.5 mg by nebulization every 8 (eight) hours as needed. Shortness of breath, Disp: , Rfl:  .  doxycycline (VIBRAMYCIN) 100 MG capsule, Take 1 capsule (100 mg total) by mouth 2 (two) times daily. (Patient not taking: Reported on 07/13/2016), Disp: 14 capsule, Rfl: 0 .  glucose monitoring kit (FREESTYLE) monitoring kit, 1 each by Does  not apply route as needed for other., Disp: 1 each, Rfl: 0 .  ibuprofen (ADVIL,MOTRIN) 600 MG tablet, Take 1 tablet (600 mg total) by mouth every 6 (six) hours as needed. (Patient not taking: Reported on 07/13/2016), Disp: 30 tablet, Rfl: 0 .  loperamide (IMODIUM) 2 MG capsule, Take 1 capsule (2 mg total) by mouth 4 (four) times daily as needed for diarrhea or loose stools. (Patient not taking: Reported on 07/13/2016), Disp: 12 capsule, Rfl: 0 .  nystatin (MYCOSTATIN) 100000 UNIT/ML suspension, Take 5 mLs (500,000 Units total) by mouth 4 (four) times daily. (Patient not taking: Reported on 07/13/2016), Disp: 60 mL, Rfl: 0 .  ondansetron (ZOFRAN ODT) 4 MG disintegrating tablet, Take 1 tablet (4 mg total) by mouth every 8 (eight) hours as needed for nausea or vomiting. (Patient not taking: Reported on 07/13/2016), Disp: 20 tablet, Rfl: 0  Exam: Current vital signs: BP 111/64   Pulse 87   Temp 99 F  (37.2 C) (Oral)   Resp 16   Ht 5' 9.5" (1.765 m)   Wt 99.8 kg (220 lb)   SpO2 97%   BMI 32.02 kg/m  Vital signs in last 24 hours: Temp:  [99 F (37.2 C)] 99 F (37.2 C) (07/29 1835) Pulse Rate:  [78-110] 87 (07/29 2215) Resp:  [13-28] 16 (07/29 2136) BP: (111-139)/(62-86) 111/64 (07/29 2136) SpO2:  [94 %-100 %] 97 % (07/29 2215) Weight:  [99.8 kg (220 lb)] 99.8 kg (220 lb) (07/29 1832) GENERAL: Awake, alert in NAD HEENT: - Normocephalic and atraumatic, dry mm, no LN++, no Thyromegally.  Piercings on the tongue.  No tongue bite. LUNGS - Clear to auscultation bilaterally with no wheezes CV - S1S2 RRR, no m/r/g, equal pulses bilaterally. ABDOMEN - Soft, nontender, nondistended with normoactive BS Ext: warm, well perfused, intact peripheral pulses, no edema  NEURO:  Mental Status: AA&Ox3  Language: speech is clear with no dysarthria.  Naming, repetition, fluency, and comprehension intact. Cranial Nerves: PERRL. EOMI, visual fields full, no facial asymmetry,facial sensation intact, hearing intact, tongue/uvula/soft palate midline, normal sternocleidomastoid and trapezius muscle strength. No evidence of tongue atrophy or fibrillations Motor: 5/5 all over with no asterixis or drift. Tone: is normal and bulk is normal Sensation- Intact to light touch bilaterally Coordination: FTN intact bilaterally, no ataxia in BLE. Gait- deferred NIH stroke scale-0 Labs I have reviewed labs in epic and the results pertinent to this consultation are: CBC    Component Value Date/Time   WBC 8.4 10/07/2017 1843   RBC 4.94 10/07/2017 1843   HGB 14.3 10/07/2017 1853   HGB 15.3 08/02/2013 1749   HCT 42.0 10/07/2017 1853   HCT 44.0 08/02/2013 1749   PLT 265 10/07/2017 1843   PLT 262 08/02/2013 1749   MCV 86.0 10/07/2017 1843   MCV 88 08/02/2013 1749   MCH 31.2 10/07/2017 1843   MCHC 36.2 (H) 10/07/2017 1843   RDW 12.6 10/07/2017 1843   RDW 13.0 08/02/2013 1749   LYMPHSABS 3.0 10/07/2017 1843    LYMPHSABS 3.2 08/02/2013 1749   MONOABS 0.3 10/07/2017 1843   MONOABS 0.5 08/02/2013 1749   EOSABS 0.2 10/07/2017 1843   EOSABS 0.2 08/02/2013 1749   BASOSABS 0.0 10/07/2017 1843   BASOSABS 0.1 08/02/2013 1749   CMP     Component Value Date/Time   NA 138 10/07/2017 1853   NA 139 08/02/2013 1749   K 3.7 10/07/2017 1853   K 4.0 08/02/2013 1749   CL 108 10/07/2017 1853   CL 108 (  H) 08/02/2013 1749   CO2 17 (L) 10/07/2017 1843   CO2 23 08/02/2013 1749   GLUCOSE 306 (H) 10/07/2017 1853   GLUCOSE 126 (H) 08/02/2013 1749   BUN 7 10/07/2017 1853   BUN 16 08/02/2013 1749   CREATININE 0.40 (L) 10/07/2017 1853   CREATININE 0.81 08/02/2013 1749   CALCIUM 9.2 10/07/2017 1843   CALCIUM 9.0 08/02/2013 1749   PROT 6.6 10/07/2017 1843   PROT 7.6 08/02/2013 1749   ALBUMIN 4.0 10/07/2017 1843   ALBUMIN 3.8 08/02/2013 1749   AST 41 10/07/2017 1843   AST 33 08/02/2013 1749   ALT 44 10/07/2017 1843   ALT 44 08/02/2013 1749   ALKPHOS 49 10/07/2017 1843   ALKPHOS 51 08/02/2013 1749   BILITOT 0.7 10/07/2017 1843   BILITOT 0.3 08/02/2013 1749   GFRNONAA >60 10/07/2017 1843   GFRNONAA >60 08/02/2013 1749   GFRAA >60 10/07/2017 1843   GFRAA >60 08/02/2013 1749  Urinary toxicology screen positive for benzodiazepines.  Negative for alcohol, salicylate, amphetamines, barbiturates, opiates, cocaine, THC.  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 She has noted correction of these events.  No history of bowel bladder incontinence.  On examination, no evidence of tongue  bite. Patient is being admitted for further work-up. Given the description of the seizures with back arching, pelvic thrusting, kicking of legs and hyperventilating, a non-epileptic etiology is very much high on the differentials but nonepileptic seizures do frequently exist in patients with underlying seizure disorder, hence further work-up is warranted.   Impression: Seizure-like activity-evaluate for epileptic versus nonepileptic events Hyperglycemia Strong psychiatric comorbidities  Recommendations -At this time-no antiepileptics - Ativan IV for seizure lasting more than 2 minutes.  Please also call neurology at that time. -routine EEG in the morning.  Can consider long-term EEG in-house if any abnormalities noted on routine EEG based on clinical course and testing results.  Otherwise, she should get a long-term EEG as an outpatient with outpatient neurology follow-up as already recommended by Dr. Lavell Anchors. - MRI of the brain with without contrast - Consider psychiatry consultation for medication optimization-can be done as an outpatient as well. -Minimize opiates/pain medications - She is on chronic alprazolam.  1 mg 4 times a day.  I would recommend lowering the dose per psychiatry recommendations. -Continue to hold Lamictal.  Can be restarted after EEG is done. -Maintain seizure precautions -No driving per [facility] law unless 6 months seizure-free. Detailed seizure precautions below:  Per [facility] statutes, patients with seizures are not allowed to drive until they have been seizure-free for six months.   Use caution when using heavy equipment or power tools. Avoid working on ladders or at heights. Take showers instead of baths. Ensure the water temperature is not too high on the home water heater. Do not go swimming alone. Do not lock yourself in a room alone (i.e. bathroom). When caring for infants or small children, sit down when holding, feeding, or changing  them to minimize risk of injury to the child in the event you have a seizure. Maintain good sleep hygiene. Avoid alcohol.   If patient has another seizure,  call 911 and bring them back to the ED if: A. The seizure lasts longer than 5 minutes.  B. The patient doesn't wake shortly after the seizure or has new problems such as difficulty seeing, speaking or moving following the seizure C. The patient was injured during the seizure D. The patient has a temperature over 102 F (39C) E. The patient vomited during the seizure and now is having trouble breathing   Inpatient neurology service will follow with you.  Please call with questions.  -- Amie Portland, MD [facility] Pager: [phone] If 7pm to 7am, please call on call as listed on [facility]ION.

## 2017-10-07 NOTE — ED Notes (Signed)
Family contacted about pt being transported. Reports they will be there in the morning

## 2017-10-07 NOTE — ED Notes (Signed)
Transported to [facility] in stable condition by [facility]

## 2017-10-07 NOTE — ED Notes (Addendum)
Pt unhooked herself from the vital machine, crawled out of bed and ambulated to the restroom. A staff member saw her walking in the hallway and assisted her to the restroom. Urine sample collected

## 2017-10-07 NOTE — ED Notes (Addendum)
[facility] contacted to transport pt to [id number]

## 2017-10-07 NOTE — ED Notes (Signed)
Report given to [facility]. 

## 2017-10-07 NOTE — ED Notes (Addendum)
Family speaking with Dr. Darl Householder in conference room

## 2017-10-08 ENCOUNTER — Observation Stay (HOSPITAL_COMMUNITY): Payer: Medicaid Other

## 2017-10-08 ENCOUNTER — Encounter (HOSPITAL_COMMUNITY): Payer: Self-pay

## 2017-10-08 DIAGNOSIS — R569 Unspecified convulsions: Secondary | ICD-10-CM | POA: Diagnosis not present

## 2017-10-08 DIAGNOSIS — R739 Hyperglycemia, unspecified: Secondary | ICD-10-CM | POA: Diagnosis not present

## 2017-10-08 DIAGNOSIS — F17210 Nicotine dependence, cigarettes, uncomplicated: Secondary | ICD-10-CM

## 2017-10-08 DIAGNOSIS — Z818 Family history of other mental and behavioral disorders: Secondary | ICD-10-CM

## 2017-10-08 DIAGNOSIS — K219 Gastro-esophageal reflux disease without esophagitis: Secondary | ICD-10-CM | POA: Diagnosis not present

## 2017-10-08 DIAGNOSIS — G4700 Insomnia, unspecified: Secondary | ICD-10-CM

## 2017-10-08 DIAGNOSIS — F3132 Bipolar disorder, current episode depressed, moderate: Secondary | ICD-10-CM | POA: Diagnosis not present

## 2017-10-08 DIAGNOSIS — F329 Major depressive disorder, single episode, unspecified: Secondary | ICD-10-CM | POA: Diagnosis not present

## 2017-10-08 LAB — BASIC METABOLIC PANEL
ANION GAP: 11 (ref 5–15)
BUN: 9 mg/dL (ref 6–20)
CO2: 18 mmol/L — ABNORMAL LOW (ref 22–32)
Calcium: 8.9 mg/dL (ref 8.9–10.3)
Chloride: 107 mmol/L (ref 98–111)
Creatinine, Ser: 0.63 mg/dL (ref 0.44–1.00)
GFR calc Af Amer: >60 mL/min (ref >60)
Glucose, Bld: 278 mg/dL — ABNORMAL HIGH (ref 70–99)
POTASSIUM: 4.3 mmol/L (ref 3.5–5.1)
SODIUM: 136 mmol/L (ref 135–145)

## 2017-10-08 LAB — GLUCOSE, CAPILLARY
GLUCOSE-CAPILLARY: 210 mg/dL — AB (ref 70–99)
Glucose-Capillary: 166 mg/dL — ABNORMAL HIGH (ref 70–99)
Glucose-Capillary: 220 mg/dL — ABNORMAL HIGH (ref 70–99)
Glucose-Capillary: 240 mg/dL — ABNORMAL HIGH (ref 70–99)
Glucose-Capillary: 248 mg/dL — ABNORMAL HIGH (ref 70–99)
Glucose-Capillary: 320 mg/dL — ABNORMAL HIGH (ref 70–99)
Glucose-Capillary: 331 mg/dL — ABNORMAL HIGH (ref 70–99)

## 2017-10-08 LAB — TSH: TSH: 1.874 u[IU]/mL (ref 0.350–4.500)

## 2017-10-08 MED ORDER — INSULIN GLARGINE 100 UNIT/ML ~~LOC~~ SOLN
15.0000 [IU] | Freq: Every day | SUBCUTANEOUS | Status: DC
  Administered 2017-10-08 – 2017-10-10 (×3): 15 [IU] via SUBCUTANEOUS
  Filled 2017-10-08 (×3): qty 0.15

## 2017-10-08 MED ORDER — NICOTINE 7 MG/24HR TD PT24
7.0000 mg | MEDICATED_PATCH | Freq: Every day | TRANSDERMAL | Status: DC
Start: 2017-10-08 — End: 2017-10-09
  Administered 2017-10-08 (×2): 7 mg via TRANSDERMAL
  Filled 2017-10-08 (×2): qty 1

## 2017-10-08 MED ORDER — GADOBENATE DIMEGLUMINE 529 MG/ML IV SOLN
20.0000 mL | Freq: Once | INTRAVENOUS | Status: AC | PRN
  Administered 2017-10-08: 20 mL via INTRAVENOUS

## 2017-10-08 MED ORDER — LITHIUM CARBONATE ER 300 MG PO TBCR
300.0000 mg | EXTENDED_RELEASE_TABLET | Freq: Two times a day (BID) | ORAL | Status: DC
  Administered 2017-10-08 – 2017-10-10 (×4): 300 mg via ORAL
  Filled 2017-10-08 (×5): qty 1

## 2017-10-08 NOTE — Progress Notes (Signed)
Pt had a witnessed seizure for about 5 secs. Husband had just left.

## 2017-10-08 NOTE — Progress Notes (Signed)
EEG completed; results pending.    

## 2017-10-08 NOTE — Progress Notes (Signed)
Patient had episode of convulsions witnessed by staff. Per NT, patient was convulsing for less than 10 seconds before RN arrived. Patient opening eyes to painful stimuli and to name, not verbally responsive aside from moaning. MD notified. VSS.
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
## 2017-10-08 NOTE — Progress Notes (Signed)
Pt has been requesting a lot of juices and snacks and stated she eats whatever she wants at home. She knows she has type 1 diabetes but does not seem concerned about it. A1c is ordered for the morning.

## 2017-10-08 NOTE — Procedures (Signed)
ELECTROENCEPHALOGRAM REPORT   Patient: Rebecca Moyer       Room #: 1L57W EEG No. ID: [id number] Age: 32 y.o.        Sex: female Referring Physician: [place] Pink Report Date:  10/08/2017        Interpreting Physician: Alexis Goodell  History: ALLYIAH GARTNER is an 32 y.o. female with a history of seizures presenting with seizure-like events  Medications:  Xanax, Tenormin, Cardizem, Insulin, Protonix  Conditions of Recording:  This is a 21 channel routine scalp EEG performed with bipolar and monopolar montages arranged in accordance to the international 10/20 system of electrode placement. One channel was dedicated to EKG recording.  The patient is in the awake, drowsy and asleep states.  Description:  The waking background activity consists of a low voltage, symmetrical, fairly well organized, 9 Hz alpha activity, seen from the parieto-occipital and posterior temporal regions.  Low voltage fast activity, poorly organized, is seen anteriorly and is at times superimposed on more posterior regions.  A mixture of theta and alpha rhythms are seen from the central and temporal regions. The patient drowses with slowing to irregular, low voltage theta and beta activity.   The patient goes in to a light sleep with symmetrical sleep spindles, vertex central sharp transients and irregular slow activity.  No epileptiform activity is noted.   Hyperventilation was performed with a fair effort producing mild buildup but failing to elicit any abnormalities.  Intermittent photic stimulation was performed but failed to illicit any change in the tracing.    IMPRESSION: Normal electroencephalogram, awake, asleep and with activation procedures. There are no focal lateralizing or epileptiform features.   Alexis Goodell, MD Neurology [phone] 10/08/2017, 12:53 PM

## 2017-10-08 NOTE — Progress Notes (Signed)
[facility] PROGRESS NOTE  TAILEY TOP FTD:[id number] DOB: 18-Oct-1985 DOA: 10/07/2017  PCP: [name], MD  Brief History/Interval Summary: 32 year old Caucasian female with past medical history of type 1 diabetes, medication noncompliance, chronic pain on narcotics, seizure disorder with suspicion of pseudoseizures presented with concern for recurrent seizure activity.  Patient was hospitalized for further management.  Neurology was consulted.   Reason for Visit: Possible seizures vs pseudoseizures  Consultants: Neurology  Procedures: EEG  Antibiotics: None  Subjective/Interval History: Patient apparently had another episode of "convulsions" few minutes prior to my evaluation.  She is awake with eyes open.  Does not really answer my questions but does follow certain commands.  ROS: Unable to do currently  Objective:  Vital Signs  Vitals:   10/08/17 0112 10/08/17 0340 10/08/17 0827 10/08/17 0838  BP: 120/73 110/61 116/68 124/65  Pulse: 71 64 70 73  Resp:  18 18   Temp:  97.7 F (36.5 C) (!) 97.5 F (36.4 C)   TempSrc:  Oral Oral   SpO2: 96% 97% 97% 100%  Weight:      Height:        Intake/Output Summary (Last 24 hours) at 10/08/2017 1123 Last data filed at 10/08/2017 0200 Gross per 24 hour  Intake 1000 ml  Output 200 ml  Net 800 ml   Filed Weights   10/07/17 1832  Weight: 99.8 kg (220 lb)    General appearance: alert, appears stated age, distracted and no distress Head: Normocephalic, without obvious abnormality, atraumatic Resp: clear to auscultation bilaterally Cardio: regular rate and rhythm, S1, S2 normal, no murmur, click, rub or gallop GI: soft, non-tender; bowel sounds normal; no masses,  no organomegaly Extremities: extremities normal, atraumatic, no cyanosis or edema Pulses: 2+ and symmetric Neurologic: She is awake with eyes open.  Does squeeze my fingers with both her hands.  Does lift her legs somewhat.  No obvious focal  neurological deficits noted.  Lab Results:  Data Reviewed: I have personally reviewed following labs and imaging studies  CBC: Recent Labs  Lab 10/07/17 1843 10/07/17 1853  WBC 8.4  --   NEUTROABS 4.8  --   HGB 15.4* 14.3  HCT 42.5 42.0  MCV 86.0  --   PLT 265  --     Basic Metabolic Panel: Recent Labs  Lab 10/07/17 1843 10/07/17 1853  NA 135 138  K 3.9 3.7  CL 105 108  CO2 17*  --   GLUCOSE 293* 306*  BUN 8 7  CREATININE 0.72 0.40*  CALCIUM 9.2  --     GFR: Estimated Creatinine Clearance: 131.7 mL/min (A) (by C-G formula based on SCr of 0.4 mg/dL (L)).  Liver Function Tests: Recent Labs  Lab 10/07/17 1843  AST 41  ALT 44  ALKPHOS 49  BILITOT 0.7  PROT 6.6  ALBUMIN 4.0    CBG: Recent Labs  Lab 10/07/17 2228 10/08/17 0022 10/08/17 0404 10/08/17 0829  GLUCAP 223* 248* 320* 331*      Radiology Studies: Ct Head Wo Contrast  Result Date: 10/07/2017 CLINICAL DATA:  Seizure EXAM: CT HEAD WITHOUT CONTRAST TECHNIQUE: Contiguous axial images were obtained from the base of the skull through the vertex without intravenous contrast. COMPARISON:  07/13/2016 FINDINGS: Brain: No evidence of acute infarction, hemorrhage, hydrocephalus, extra-axial collection or mass lesion/mass effect. Vascular: No hyperdense vessel or unexpected calcification. Skull: Normal. Negative for fracture or focal lesion. Sinuses/Orbits: Near complete opacification of left maxillary sinus. No acute orbital abnormality. Other: Calcified  scalp mass in the right posterior parietal region. IMPRESSION: Negative non contrasted CT appearance of the brain Electronically Signed   By: Donavan Foil M.D.   On: 10/07/2017 20:36     Medications:  Scheduled: . ALPRAZolam  1 mg Oral Q6H  . atenolol  50 mg Oral QPM  . atorvastatin  80 mg Oral QPM  . diltiazem  120 mg Oral QPM  . enoxaparin (LOVENOX) injection  40 mg Subcutaneous Q24H  . insulin aspart  0-15 Units Subcutaneous Q4H  . nicotine  7 mg  Transdermal Daily  . pantoprazole  40 mg Oral Daily   Continuous:  HDQ:[id number] **OR** acetaminophen, ondansetron **OR** ondansetron (ZOFRAN) IV, oxyCODONE-acetaminophen **AND** oxyCODONE  Assessment/Plan:    Seizure disorder Patient with also some concern for pseudoseizures.  Neurology has been consulted.  It looks like EEG was ordered.  MRI was recommended by neurology but not ordered yet.  We will place the order now.  CT head did not show any acute findings.  Patient does have significant underlying psychiatric history as well.  Since there is concern for pseudoseizures and since some of her medications could be contributing we will consult psychiatry to assist with management.  Diabetes mellitus to be type II than type I Home medication list does not show any diabetic medications.  CBSs noted to be elevated.  We will place her on Lantus insulin continue with sliding scale insulin.  HbA1c is pending.  History of depression and anxiety Patient noted to be on alprazolam which is being continued.  Restoril is also mentioned on her home medication list.  Lamictal is also mentioned.  Lamictal is likely for seizure disorder.  Hematology is on hold currently.  Will request psychiatry to assist with psychiatric medications in the current clinical context.  Possible history of essential hypertension Patient noted to be on Cardizem as well as atenolol.  Presumably for history of hypertension.  Continue for now.  Monitor blood pressures closely.  History of GERD Continue PPI  History of chronic pain Noted to be on Percocet.  DVT Prophylaxis: Lovenox    Code Status: Full code Family Communication: No family at bedside Disposition Plan: Management as outlined above.  Await MRI EEG and further neuro input.    LOS: 0 days   [facility] Pager [phone] 10/08/2017, 11:23 AM  If 7PM-7AM, please contact night-coverage at [email or web address], password [facility]

## 2017-10-08 NOTE — Progress Notes (Signed)
Pt has not been to sleep all night.

## 2017-10-08 NOTE — Progress Notes (Addendum)
Inpatient Diabetes Program Recommendations  AACE/ADA: New Consensus Statement on Inpatient Glycemic Control (2019)  Target Ranges:  Prepandial:   less than 140 mg/dL      Peak postprandial:   less than 180 mg/dL (1-2 hours)      Critically ill patients:  140 - 180 mg/dL  Results for RHEAGAN, NAYAK (MRN [id number]) as of 10/08/2017 08:44  Ref. Range 10/07/2017 22:28 10/08/2017 00:22 10/08/2017 04:04 10/08/2017 08:29  Glucose-Capillary Latest Ref Range: 70 - 99 mg/dL 223 (H) 248 (H) 320 (H) 331 (H)    Review of Glycemic Control   Outpatient Diabetes medications: None listed on home medication list Current orders for Inpatient glycemic control: Novolog 0-15 units Q4H  Inpatient Diabetes Program Recommendations: Insulin - Basal: Please consider ordering Lantus 20 units Q24H starting now (based on 99 kg x 0.2 units). Insulin-Correction: Since patient is eating, please consider changing CBGs and Novolog correction to Novolog 0-15 units TID with meals and Novolog 0-5 units QHS. Insulin-Meal Coverage: Please consider ordering Novolog 4 units TID with meals for meal coverage if patient eats at least 50% of meals. HgbA1C: A1C ordered. Outpatient DM medications: At time of discharge, please prescribe Rx for DM medications. Patient use to take insulin at home and if prescribed, she prefers insulin vial/syringe.  NOTE: In reviewing chart, noted in H&P there is notation that patient has Type 1 DM. However, in reviewing chart further note patient has taken Levemir, Victoza, Glipizide, and Metformin in the past for DM control which is more typical of Type 2 DM treatment. Will plan to talk with patient today to discuss further and clarify outpatient DM medications.  Addendum 10/08/17@11 :30-Spoke with patient about diabetes and home regimen for diabetes control. Patient reports that she is followed by Dr. Tamsen Roers for diabetes management and currently she takes NO medication for DM control as an outpatient for  diabetes control. Patient reports that she has Metformin at home but she does not take it because it makes her feel sick. Patient reports that she use to take Levemir insulin in the past as well as "another injection" but has not taken either in over 6 months. Patient reports that she does not have any DM medication at home other than Metformin.  Inquired about why she has not taken any DM medications and patient states "I get tired of it and it doesn't work anyway. I want to go on an insulin pump."  Patient states that she checks her glucose at home infrequently but when she does it is usually over 400 mg/dl.   Inquired about prior A1C and patient reports that her A1C was 12.9% about 2 months ago. Explained that a current A1C has been ordered but not resulted yet. Discussed reported A1C results (12.9%) and explained that A1C indicates an average glucose of 324 mg/dl. Discussed glucose and A1C goals. Discussed importance of checking CBGs and maintaining good CBG control to prevent long-term and short-term complications. Explained how hyperglycemia leads to damage within blood vessels which lead to the common complications seen with uncontrolled diabetes. Stressed to the patient the importance of improving glycemic control to prevent further complications from uncontrolled diabetes especially given that she is 32 years old. Discussed impact of nutrition, exercise, stress, sickness, and medications on diabetes control. Patient states that she does not follow Carb modified diet. Patient's significant other asked about carbohydrate diet and asked if patient could eat anything she wanted.  Discussed carbohydrates, carbohydrate goals per day and meal, along with portion sizes.  Will order RD consult as well for further diet education. Inquired about an Musician and patient states that her PCP told her he was referring her to an Endocrinologist at last appointment 2 months ago but she has not received a call from  Endocrinology office.  Discussed insulin pumps and explained that an Endocrinologist works with patient closely to manage pump and DM control.  Discussed how an insulin pump works and explained that when patients use an insulin pump it still requires a lot of work and effort to keep DM controlled. Explained that she will need to take DM medications as prescribed and that it appears that she needs to take insulin especially if A1C is 12.9% and based on glycemic trends as an inpatient. Patient states that she has used insulin vial/syringe and insulin pen in the past and she prefers to use vial/syringe.  Encouraged patient to check her glucose 4 times per day (before meals and at bedtime) and to keep a log book of glucose readings. Patient verbalized understanding of information discussed and she states that she has no further questions at this time related to diabetes. Called Dr. Eddie Dibbles office and was informed that a referral to [facility] is noted in patient chart and they will check to be sure the referral was indeed made.     Thanks, Rebecca Alderman, RN, MSN, CDE Diabetes Coordinator Inpatient Diabetes Program [phone] (Team Pager)

## 2017-10-09 ENCOUNTER — Observation Stay (HOSPITAL_COMMUNITY): Payer: Medicaid Other

## 2017-10-09 DIAGNOSIS — Z79891 Long term (current) use of opiate analgesic: Secondary | ICD-10-CM | POA: Diagnosis not present

## 2017-10-09 DIAGNOSIS — Z885 Allergy status to narcotic agent status: Secondary | ICD-10-CM | POA: Diagnosis not present

## 2017-10-09 DIAGNOSIS — F3132 Bipolar disorder, current episode depressed, moderate: Secondary | ICD-10-CM | POA: Diagnosis not present

## 2017-10-09 DIAGNOSIS — K219 Gastro-esophageal reflux disease without esophagitis: Secondary | ICD-10-CM

## 2017-10-09 DIAGNOSIS — F3130 Bipolar disorder, current episode depressed, mild or moderate severity, unspecified: Secondary | ICD-10-CM | POA: Diagnosis present

## 2017-10-09 DIAGNOSIS — F329 Major depressive disorder, single episode, unspecified: Secondary | ICD-10-CM | POA: Diagnosis not present

## 2017-10-09 DIAGNOSIS — I10 Essential (primary) hypertension: Secondary | ICD-10-CM | POA: Diagnosis present

## 2017-10-09 DIAGNOSIS — R569 Unspecified convulsions: Secondary | ICD-10-CM | POA: Diagnosis present

## 2017-10-09 DIAGNOSIS — F419 Anxiety disorder, unspecified: Secondary | ICD-10-CM | POA: Diagnosis present

## 2017-10-09 DIAGNOSIS — Z9114 Patient's other noncompliance with medication regimen: Secondary | ICD-10-CM | POA: Diagnosis not present

## 2017-10-09 DIAGNOSIS — Z880 Allergy status to penicillin: Secondary | ICD-10-CM | POA: Diagnosis not present

## 2017-10-09 DIAGNOSIS — Z6832 Body mass index (BMI) 32.0-32.9, adult: Secondary | ICD-10-CM | POA: Diagnosis not present

## 2017-10-09 DIAGNOSIS — E669 Obesity, unspecified: Secondary | ICD-10-CM | POA: Diagnosis present

## 2017-10-09 DIAGNOSIS — J45909 Unspecified asthma, uncomplicated: Secondary | ICD-10-CM | POA: Diagnosis present

## 2017-10-09 DIAGNOSIS — G894 Chronic pain syndrome: Secondary | ICD-10-CM | POA: Diagnosis present

## 2017-10-09 DIAGNOSIS — F17210 Nicotine dependence, cigarettes, uncomplicated: Secondary | ICD-10-CM | POA: Diagnosis present

## 2017-10-09 DIAGNOSIS — G4700 Insomnia, unspecified: Secondary | ICD-10-CM | POA: Diagnosis present

## 2017-10-09 DIAGNOSIS — E7800 Pure hypercholesterolemia, unspecified: Secondary | ICD-10-CM | POA: Diagnosis present

## 2017-10-09 DIAGNOSIS — Z888 Allergy status to other drugs, medicaments and biological substances status: Secondary | ICD-10-CM | POA: Diagnosis not present

## 2017-10-09 DIAGNOSIS — Z79899 Other long term (current) drug therapy: Secondary | ICD-10-CM | POA: Diagnosis not present

## 2017-10-09 DIAGNOSIS — Z7982 Long term (current) use of aspirin: Secondary | ICD-10-CM | POA: Diagnosis not present

## 2017-10-09 DIAGNOSIS — E1065 Type 1 diabetes mellitus with hyperglycemia: Secondary | ICD-10-CM | POA: Diagnosis present

## 2017-10-09 DIAGNOSIS — Z7951 Long term (current) use of inhaled steroids: Secondary | ICD-10-CM | POA: Diagnosis not present

## 2017-10-09 LAB — CBC
HEMATOCRIT: 42.3 % (ref 36.0–46.0)
Hemoglobin: 15.1 g/dL — ABNORMAL HIGH (ref 12.0–15.0)
MCH: 31.2 pg (ref 26.0–34.0)
MCHC: 35.7 g/dL (ref 30.0–36.0)
MCV: 87.4 fL (ref 78.0–100.0)
Platelets: 275 10*3/uL (ref 150–400)
RBC: 4.84 MIL/uL (ref 3.87–5.11)
RDW: 12.2 % (ref 11.5–15.5)
WBC: 8 10*3/uL (ref 4.0–10.5)

## 2017-10-09 LAB — COMPREHENSIVE METABOLIC PANEL
ALT: 38 U/L (ref 0–44)
AST: 39 U/L (ref 15–41)
Albumin: 3.5 g/dL (ref 3.5–5.0)
Alkaline Phosphatase: 43 U/L (ref 38–126)
Anion gap: 10 (ref 5–15)
BILIRUBIN TOTAL: 1.1 mg/dL (ref 0.3–1.2)
BUN: 12 mg/dL (ref 6–20)
CHLORIDE: 105 mmol/L (ref 98–111)
CO2: 22 mmol/L (ref 22–32)
Calcium: 9 mg/dL (ref 8.9–10.3)
Creatinine, Ser: 0.63 mg/dL (ref 0.44–1.00)
Glucose, Bld: 275 mg/dL — ABNORMAL HIGH (ref 70–99)
POTASSIUM: 4.2 mmol/L (ref 3.5–5.1)
Sodium: 137 mmol/L (ref 135–145)
TOTAL PROTEIN: 5.9 g/dL — AB (ref 6.5–8.1)

## 2017-10-09 LAB — GLUCOSE, CAPILLARY
GLUCOSE-CAPILLARY: 288 mg/dL — AB (ref 70–99)
GLUCOSE-CAPILLARY: 341 mg/dL — AB (ref 70–99)
Glucose-Capillary: 251 mg/dL — ABNORMAL HIGH (ref 70–99)
Glucose-Capillary: 272 mg/dL — ABNORMAL HIGH (ref 70–99)
Glucose-Capillary: 345 mg/dL — ABNORMAL HIGH (ref 70–99)

## 2017-10-09 LAB — HIV ANTIBODY (ROUTINE TESTING W REFLEX): HIV SCREEN 4TH GENERATION: NONREACTIVE

## 2017-10-09 LAB — HEMOGLOBIN A1C
HEMOGLOBIN A1C: 11.4 % — AB (ref 4.8–5.6)
Mean Plasma Glucose: 280.48 mg/dL

## 2017-10-09 MED ORDER — NICOTINE POLACRILEX 2 MG MT GUM
2.0000 mg | CHEWING_GUM | OROMUCOSAL | Status: DC | PRN
  Administered 2017-10-09 – 2017-10-10 (×5): 2 mg via ORAL
  Filled 2017-10-09 (×7): qty 1

## 2017-10-09 MED ORDER — TEMAZEPAM 15 MG PO CAPS
30.0000 mg | ORAL_CAPSULE | Freq: Every day | ORAL | Status: DC
  Administered 2017-10-09 (×2): 30 mg via ORAL
  Filled 2017-10-09 (×2): qty 2

## 2017-10-09 NOTE — Progress Notes (Signed)
PROGRESS NOTE    Rebecca Moyer  JAS:[id number] DOB: 01/12/1986 DOA: 10/07/2017 PCP: [name], MD    Brief Narrative:  32 year old female who presented with a seizure.  She does have significant past medical history of type 1 diabetes mellitus, chronic pain syndrome, suspected C. difficile seizures.  Reported increase in seizure frequency, apparently she had about 6 episodes on the day of admission.  On the initial physical examination blood pressure 111/64, heart rate 85, temperature 99, respiratory 16, oxygen saturation 100%, moist mucous membranes, lungs clear to auscultation bilaterally, heart S1-S2 present rhythmic, abdomen soft nontender, no lower extremity edema, neurologically patient was nonfocal.  Was admitted to the hospital working diagnosis of withdrawal seizures to rule out pseudoseizures.  Assessment & Plan:   Principal Problem:   Moderate bipolar I disorder, most recent episode depressed ([place]) Active Problems:   Depression   Hyperglycemia   Seizure (HCC)   Insomnia   GERD (gastroesophageal reflux disease)   1. Seizures/ pseudoseizures. No further episodes in the hospital, currently will have 24 hours EEG recordings. Continue to hold on lamictal, follow with neurology recommendations.   2. T1DM. Will continue glucose cover and monitoring with insulin sliding scale, patient tolerating po well. Basal insulin with 15 units of glargine. Capillary glucose 166, 251, 271, 288, 341.    3. HTN. Continue blood pressure control with atenolol and diltiazem.   4. Depression/ anxiety. Will continue alprazolam and restoril, Continue lithium   DVT prophylaxis: enoxaparin   Code Status: full Family Communication: I spoke with patient's mother at the bedside and all questions were addressed.  Disposition Plan/ discharge barriers: plan to dc in am after result of 24 hours EEG.    Consultants:   Neurology   Procedures:     Antimicrobials:        Subjective: Patient is feeling well, no nausea or vomiting, no chest pain or dyspnea.   Objective: Vitals:   10/09/17 0412 10/09/17 0732 10/09/17 1124 10/09/17 1614  BP: 121/80 119/85 122/79 133/80  Pulse: 72 69 73 80  Resp: 18 18 18 18   Temp: 98.2 F (36.8 C) 98.3 F (36.8 C) 98.2 F (36.8 C) 98.4 F (36.9 C)  TempSrc: Oral Oral Oral Oral  SpO2: 95% 97% 95% 94%  Weight:      Height:        Intake/Output Summary (Last 24 hours) at 10/09/2017 1623 Last data filed at 10/08/2017 2007 Gross per 24 hour  Intake 240 ml  Output 300 ml  Net -60 ml   Filed Weights   10/07/17 1832  Weight: 99.8 kg (220 lb)    Examination:   General: Not in pain or dyspnea, deconditioned  Neurology: Awake and alert, non focal  E ENT: no pallor, no icterus, oral mucosa moist Cardiovascular: No JVD. S1-S2 present, rhythmic, no gallops, rubs, or murmurs. No lower extremity edema. Pulmonary: vesicular breath sounds bilaterally, adequate air movement, no wheezing, rhonchi or rales. Gastrointestinal. Abdomen with, no organomegaly, non tender, no rebound or guarding Skin. No rashes Musculoskeletal: no joint deformities     Data Reviewed: I have personally reviewed following labs and imaging studies  CBC: Recent Labs  Lab 10/07/17 1843 10/07/17 1853 10/09/17 0551  WBC 8.4  --  8.0  NEUTROABS 4.8  --   --   HGB 15.4* 14.3 15.1*  HCT 42.5 42.0 42.3  MCV 86.0  --  87.4  PLT 265  --  419   Basic Metabolic Panel: Recent Labs  Lab  10/07/17 1843 10/07/17 1853 10/08/17 1238 10/09/17 0551  NA 135 138 136 137  K 3.9 3.7 4.3 4.2  CL 105 108 107 105  CO2 17*  --  18* 22  GLUCOSE 293* 306* 278* 275*  BUN 8 7 9 12   CREATININE 0.72 0.40* 0.63 0.63  CALCIUM 9.2  --  8.9 9.0   GFR: Estimated Creatinine Clearance: 131.7 mL/min (by C-G formula based on SCr of 0.63 mg/dL). Liver Function Tests: Recent Labs  Lab 10/07/17 1843 10/09/17 0551  AST 41 39  ALT 44 38  ALKPHOS 49 43   BILITOT 0.7 1.1  PROT 6.6 5.9*  ALBUMIN 4.0 3.5   No results for input(s): LIPASE, AMYLASE in the last 168 hours. No results for input(s): AMMONIA in the last 168 hours. Coagulation Profile: No results for input(s): INR, PROTIME in the last 168 hours. Cardiac Enzymes: No results for input(s): CKTOTAL, CKMB, CKMBINDEX, TROPONINI in the last 168 hours. BNP (last 3 results) No results for input(s): PROBNP in the last 8760 hours. HbA1C: Recent Labs    10/09/17 0551  HGBA1C 11.4*   CBG: Recent Labs  Lab 10/08/17 2348 10/09/17 0427 10/09/17 0730 10/09/17 1125 10/09/17 1618  GLUCAP 166* 251* 272* 288* 341*   Lipid Profile: No results for input(s): CHOL, HDL, LDLCALC, TRIG, CHOLHDL, LDLDIRECT in the last 72 hours. Thyroid Function Tests: Recent Labs    10/08/17 1610  TSH 1.874   Anemia Panel: No results for input(s): VITAMINB12, FOLATE, FERRITIN, TIBC, IRON, RETICCTPCT in the last 72 hours.    Radiology Studies: I have reviewed all of the imaging during this hospital visit personally     Scheduled Meds: . ALPRAZolam  1 mg Oral Q6H  . atenolol  50 mg Oral QPM  . atorvastatin  80 mg Oral QPM  . diltiazem  120 mg Oral QPM  . enoxaparin (LOVENOX) injection  40 mg Subcutaneous Q24H  . insulin aspart  0-15 Units Subcutaneous Q4H  . insulin glargine  15 Units Subcutaneous Daily  . lithium carbonate  300 mg Oral Q12H  . pantoprazole  40 mg Oral Daily  . temazepam  30 mg Oral QHS   Continuous Infusions:   LOS: 0 days        Mauricio Gerome Apley, MD [facility] Pager [phone]

## 2017-10-09 NOTE — Progress Notes (Signed)
Inpatient Diabetes Program Recommendations  AACE/ADA: New Consensus Statement on Inpatient Glycemic Control (2015)  Target Ranges:  Prepandial:   less than 140 mg/dL      Peak postprandial:   less than 180 mg/dL (1-2 hours)      Critically ill patients:  140 - 180 mg/dL   Lab Results  Component Value Date   GLUCAP 288 (H) 10/09/2017   HGBA1C 11.4 (H) 10/09/2017    Review of Glycemic ControlResults for [name], [name] (MRN [id number]) as of 10/09/2017 13:11  Ref. Range 10/08/2017 08:29 10/08/2017 11:37 10/08/2017 16:04 10/08/2017 20:03 10/08/2017 23:48 10/09/2017 04:27 10/09/2017 07:30 10/09/2017 11:25  Glucose-Capillary Latest Ref Range: 70 - 99 mg/dL 331 (H) 240 (H) 220 (H) 210 (H) 166 (H) 251 (H) 272 (H) 288 (H)   Diabetes history: Type 2 DM  Outpatient Diabetes medications: None per patient Current orders for Inpatient glycemic control:  Novolog moderate q 4 hours, Lantus 15 units daily  Inpatient Diabetes Program Recommendations: Please consider increasing Lantus to 25 units daily.  Also consider changing Novolog correction to tid with meals and HS.  Patient would likely benefit from the addition of Novolog meal coverage as well. Consider adding Novolog 4 units tid with meals.    Thanks,  Adah Perl, RN, BC-ADM Inpatient Diabetes Coordinator Pager [phone] (8a-5p)

## 2017-10-09 NOTE — Progress Notes (Addendum)
Subjective: She states "I do not feel so good and I feel weird".  She states that she had a seizure-like episode. Discussed with her having a long-term EEG overnight or being discharged and having a ambulatory EEG.  She felt that she would feel safer having a long-term EEG overnight. It was explained to her that her EEG was negative and her MRI was also negative; her affect after that was fairly flat.  Exam: Vitals:   10/09/17 0412 10/09/17 0732  BP: 121/80 119/85  Pulse: 72 69  Resp: 18 18  Temp: 98.2 F (36.8 C) 98.3 F (36.8 C)  SpO2: 95% 97%    Physical Exam   HEENT-  Normocephalic, no lesions, without obvious abnormality.  Normal external eye and conjunctiva.   Extremities- Warm, dry and intact Musculoskeletal-no joint tenderness, deformity or swelling Skin-warm and dry, no hyperpigmentation, vitiligo, or suspicious lesions  Neuro:  Mental Status: Alert, oriented, thought content appropriate.  Speech fluent without evidence of aphasia.  Able to follow 3 step commands without difficulty. Cranial Nerves: II:  Visual fields grossly normal,  III,IV, VI: ptosis not present, extra-ocular motions intact bilaterally pupils equal, round, reactive to light and accommodation V,VII: smile symmetric, facial light touch sensation normal bilaterally VIII: hearing normal bilaterally IX,X: uvula rises midline XI: bilateral shoulder shrug XII: midline tongue extension Motor: Right : Upper extremity   5/5    Left:     Upper extremity   5/5  Lower extremity   5/5     Lower extremity   5/5 Tone and bulk:normal tone throughout; no atrophy noted Sensory: Pinprick and light touch intact throughout, bilaterally Deep Tendon Reflexes: 2+ and symmetric throughout Plantars: Right: downgoing   Left: downgoing Cerebellar: normal finger-to-nose, normal rapid alternating movements and normal heel-to-shin test Gait: normal gait and station    Medications:  Scheduled: . ALPRAZolam  1 mg Oral Q6H   . atenolol  50 mg Oral QPM  . atorvastatin  80 mg Oral QPM  . diltiazem  120 mg Oral QPM  . enoxaparin (LOVENOX) injection  40 mg Subcutaneous Q24H  . insulin aspart  0-15 Units Subcutaneous Q4H  . insulin glargine  15 Units Subcutaneous Daily  . lithium carbonate  300 mg Oral Q12H  . nicotine  7 mg Transdermal Daily  . pantoprazole  40 mg Oral Daily  . temazepam  30 mg Oral QHS    Pertinent Labs/Diagnostics: EEG: Showed normal electroencephalogram, awake, asleep with no lateralizing or epileptiform activity  Mr Jeri Cos Wo Contrast Result Date: 10/08/2017 IMPRESSION: Normal brain MRI.   A1c is 11.4  Etta Quill PA-C [facility] [phone]   Assessment: 32 year old woman with a past medical history significant for diabetes, medication noncompliance, chronic pain on narcotics and history of seizure in the past with possible pseudoseizures.  The description of the seizures that brought her into the hospital was back arching, pelvic thrusting, kicking of legs and hypoventilating.  1. A nonepileptic etiology is high on the differential given the described semiology.   2. Patient has had an EEG which was negative.   3. Psychology has seen her and recommends further outpatient evaluation for her psychological medications.  4.  MRI brain did not show any abnormalities   Recommendations: - Given that all of the diagnostic tests so far are negative, will order long-term monitoring EEG for 24 hours while she is here in hopes to capture a few spells. - Seizure precautions - Continue to hold Lamictal.  Can be restarted  after EEG is done.    Electronically signed: Dr. Kerney Elbe 10/09/2017, 8:44 AM

## 2017-10-09 NOTE — Progress Notes (Signed)
vLTM EEG started/ tested event button. Educated family/ notified neuro.

## 2017-10-09 NOTE — Progress Notes (Signed)
PROGRESS NOTE    Rebecca Moyer  XKG:[id number] DOB: January 27, 1986 DOA: 10/07/2017 PCP: [name], MD  Outpatient Specialists:     Brief Narrative:  32 yo female with a PMH significant for Bipolar I Disorder, Schizophrenia, T2DM, HTN, Sleep Apnea, Multiple Personality Disorder, Anxiety, and Depression is here with complaints of multiple seizures in the last month. Was seen by neurology outpatient (Dr. Lavell Anchors) in August 2017. An EEG was ordered but the patient never went to the procedure nor followed up. Has been taking Lamictal as prescribed for the last 6 months, unknown source. In the ED, the patient demonstrated episodes of hyperventilation and generalized tonic-clonic movements without eye deviation. Neurology was consulted with a strong suspicion for pseudoseizures however further workup was indicated to rule out epileptic origin. MR Brain - normal brain MRI. CT Head wo Contrast - negative non-contrasted CT. EKG - NSR. EEG - normal EEG, awake, asleep and with activation procedures; there are no focal lateralizing or epileptiform features. Pertinent labs include the following: UA >500 glu ketones 5, UDS (+) benzos, Glu 275, Hbg 15, TSH wnl 7.026, Salicylate < 7, Ethanol < 10. The patient will be evaluated next with overnight EEG. Neurology following. Psychiatry consulted due to extensive psychiatric history. Signed off at this time.   Assessment & Plan:   Principal Problem:   Moderate bipolar I disorder, most recent episode depressed (HCC) Active Problems:   Depression   Hyperglycemia   Seizure (HCC)   Insomnia   GERD (gastroesophageal reflux disease)  Seizure Disorder -EEG -  normal EEG, awake, asleep and with activation procedures; there are no focal lateralizing or epileptiform features -MR Brain - normal brain MRI. CT Head wo Contrast - negative non-contrasted CT -Neurology following -Overnight EEG in process  Diabetes Mellitus Type 2 -Continue blood sugar check  daily -Continue Novolog and Lantus  Depression and Anxiety -Currently not expressing signs of depression or anxiety -Continue Xanax 1 mg Q6H -Continue Restoril 30 mg QHS for sleep  Bipolar Disorder -The patient is currently A&Ox3. She is showing no signs of mania. -Continue on Lithium 300 mg BID for mood stabilization. -The patient should f/u outpatient for further medical management   Essential Hypertension -Blood pressure well-controlled -Continue Cardizem  High Cholesterol -Continue Lipitor  GERD -Continue protonix  Chronic Pain -Patient takes percocet at home for chronic pain -Continue Oxycodone 5 mg Q6H prn  DVT prophylaxis: Lovenox Code Status: Full Family Communication: The parents and husband were present at bedside during interview. The plan was discussed and all questions were answered. Disposition Plan: The patient will be monitored overnight for any seizure-like activity. She will be evaluated using the overnight EEG. Pending results of EEG, the patient will be discharged home with instructions to follow up with her Neurologist Dr. Lavell Anchors for disorder management.    Consultants:   Neurology  Psychiatry   Procedures:   Overnight EEG  Antimicrobials:   None   Subjective: The patient was sitting upright in hospital bed alert and oriented to person, place, and time, in no acute distress. Endorses feeling "cloudy," weak, sore all over, and has a HA.   Objective: Vitals:   10/08/17 2318 10/09/17 0412 10/09/17 0732 10/09/17 1124  BP: 118/89 121/80 119/85 122/79  Pulse: 69 72 69 73  Resp: 19 18 18 18   Temp: 97.8 F (36.6 C) 98.2 F (36.8 C) 98.3 F (36.8 C) 98.2 F (36.8 C)  TempSrc: Oral Oral Oral Oral  SpO2: 96% 95% 97% 95%  Weight:  Height:        Intake/Output Summary (Last 24 hours) at 10/09/2017 1222 Last data filed at 10/08/2017 2007 Gross per 24 hour  Intake 240 ml  Output 300 ml  Net -60 ml   Filed Weights   10/07/17 1832   Weight: 99.8 kg (220 lb)    Examination:  General exam: Appears calm and comfortable  Respiratory system: Clear to auscultation. Respiratory effort normal. Cardiovascular system: S1 & S2 heard, RRR. No JVD, murmurs, rubs, gallops or clicks. No pedal edema. Gastrointestinal system: Abdomen is nondistended, soft and nontender. No organomegaly or masses felt. Normal bowel sounds heard. Central nervous system: Alert and oriented. No focal neurological deficits. Extremities: Symmetric 5 x 5 power. Skin: No rashes, lesions or ulcers Psychiatry: Judgement and insight appear normal. Mood & affect appropriate.     Data Reviewed: I have personally reviewed following labs and imaging studies  CBC: Recent Labs  Lab 10/07/17 1843 10/07/17 1853 10/09/17 0551  WBC 8.4  --  8.0  NEUTROABS 4.8  --   --   HGB 15.4* 14.3 15.1*  HCT 42.5 42.0 42.3  MCV 86.0  --  87.4  PLT 265  --  619   Basic Metabolic Panel: Recent Labs  Lab 10/07/17 1843 10/07/17 1853 10/08/17 1238 10/09/17 0551  NA 135 138 136 137  K 3.9 3.7 4.3 4.2  CL 105 108 107 105  CO2 17*  --  18* 22  GLUCOSE 293* 306* 278* 275*  BUN 8 7 9 12   CREATININE 0.72 0.40* 0.63 0.63  CALCIUM 9.2  --  8.9 9.0   GFR: Estimated Creatinine Clearance: 131.7 mL/min (by C-G formula based on SCr of 0.63 mg/dL). Liver Function Tests: Recent Labs  Lab 10/07/17 1843 10/09/17 0551  AST 41 39  ALT 44 38  ALKPHOS 49 43  BILITOT 0.7 1.1  PROT 6.6 5.9*  ALBUMIN 4.0 3.5   No results for input(s): LIPASE, AMYLASE in the last 168 hours. No results for input(s): AMMONIA in the last 168 hours. Coagulation Profile: No results for input(s): INR, PROTIME in the last 168 hours. Cardiac Enzymes: No results for input(s): CKTOTAL, CKMB, CKMBINDEX, TROPONINI in the last 168 hours. BNP (last 3 results) No results for input(s): PROBNP in the last 8760 hours. HbA1C: Recent Labs    10/09/17 0551  HGBA1C 11.4*   CBG: Recent Labs  Lab  10/08/17 2003 10/08/17 2348 10/09/17 0427 10/09/17 0730 10/09/17 1125  GLUCAP 210* 166* 251* 272* 288*   Lipid Profile: No results for input(s): CHOL, HDL, LDLCALC, TRIG, CHOLHDL, LDLDIRECT in the last 72 hours. Thyroid Function Tests: Recent Labs    10/08/17 1610  TSH 1.874   Anemia Panel: No results for input(s): VITAMINB12, FOLATE, FERRITIN, TIBC, IRON, RETICCTPCT in the last 72 hours. Urine analysis:    Component Value Date/Time   COLORURINE YELLOW 10/07/2017 2151   APPEARANCEUR CLEAR 10/07/2017 2151   APPEARANCEUR Hazy 08/02/2013 1749   LABSPEC 1.030 10/07/2017 2151   LABSPEC 1.028 08/02/2013 1749   PHURINE 6.0 10/07/2017 2151   GLUCOSEU >=500 (A) 10/07/2017 2151   GLUCOSEU Negative 08/02/2013 1749   HGBUR NEGATIVE 10/07/2017 2151   BILIRUBINUR NEGATIVE 10/07/2017 2151   BILIRUBINUR Negative 08/02/2013 1749   KETONESUR 5 (A) 10/07/2017 2151   PROTEINUR NEGATIVE 10/07/2017 2151   UROBILINOGEN 0.2 12/17/2014 0647   NITRITE NEGATIVE 10/07/2017 2151   LEUKOCYTESUR NEGATIVE 10/07/2017 2151   LEUKOCYTESUR Trace 08/02/2013 1749   Sepsis Labs: @LABRCNTIP (procalcitonin:4,lacticidven:4)  )No results found  for this or any previous visit (from the past 240 hour(s)).       Radiology Studies: Ct Head Wo Contrast  Result Date: 10/07/2017 CLINICAL DATA:  Seizure EXAM: CT HEAD WITHOUT CONTRAST TECHNIQUE: Contiguous axial images were obtained from the base of the skull through the vertex without intravenous contrast. COMPARISON:  07/13/2016 FINDINGS: Brain: No evidence of acute infarction, hemorrhage, hydrocephalus, extra-axial collection or mass lesion/mass effect. Vascular: No hyperdense vessel or unexpected calcification. Skull: Normal. Negative for fracture or focal lesion. Sinuses/Orbits: Near complete opacification of left maxillary sinus. No acute orbital abnormality. Other: Calcified scalp mass in the right posterior parietal region. IMPRESSION: Negative non contrasted  CT appearance of the brain Electronically Signed   By: Donavan Foil M.D.   On: 10/07/2017 20:36   Mr Jeri Cos OV Contrast  Result Date: 10/08/2017 CLINICAL DATA:  Altered mental status. EXAM: MRI HEAD WITHOUT AND WITH CONTRAST TECHNIQUE: Multiplanar, multiecho pulse sequences of the brain and surrounding structures were obtained without and with intravenous contrast. CONTRAST:  55mL MULTIHANCE GADOBENATE DIMEGLUMINE 529 MG/ML IV SOLN COMPARISON:  Head CT 10/07/2017 FINDINGS: BRAIN: There is no acute infarct, acute hemorrhage or mass effect. The midline structures are normal. There are no old infarcts. The white matter signal is normal for the patient's age. The CSF spaces are normal for age, with no hydrocephalus. Susceptibility-sensitive sequences show no chronic microhemorrhage or superficial siderosis. No abnormal contrast enhancement. VASCULAR: Major intracranial arterial and venous sinus flow voids are preserved. SKULL AND UPPER CERVICAL SPINE: The visualized skull base, calvarium, upper cervical spine and extracranial soft tissues are normal. SINUSES/ORBITS: Left maxillary sinus mucosal thickening. The orbits are normal. IMPRESSION: Normal brain MRI. Electronically Signed   By: Ulyses Jarred M.D.   On: 10/08/2017 18:43        Scheduled Meds: . ALPRAZolam  1 mg Oral Q6H  . atenolol  50 mg Oral QPM  . atorvastatin  80 mg Oral QPM  . diltiazem  120 mg Oral QPM  . enoxaparin (LOVENOX) injection  40 mg Subcutaneous Q24H  . insulin aspart  0-15 Units Subcutaneous Q4H  . insulin glargine  15 Units Subcutaneous Daily  . lithium carbonate  300 mg Oral Q12H  . nicotine  7 mg Transdermal Daily  . pantoprazole  40 mg Oral Daily  . temazepam  30 mg Oral QHS   Continuous Infusions:   LOS: 0 days    Marney Setting, PA-S Riccardo Dubin Arrien MD [facility] Pager [phone]  If 7PM-7AM, please contact night-coverage [email or web address] Password [facility] 10/09/2017, 12:22 PM

## 2017-10-09 NOTE — Progress Notes (Signed)
   10/09/17 1100  Clinical Encounter Type  Visited With Patient and family together  Visit Type Follow-up;Spiritual support  Referral From Family  Consult/Referral To Chaplain  Spiritual Encounters  Spiritual Needs Prayer;Emotional  Stress Factors  Patient Stress Factors Major life changes  Family Stress Factors Lack of knowledge  Chaplain visited with the family after encountering the PT Father in the hallway.  Request was for prayer for PT.  Chaplain visited with the PT and Family together and prayed about the issues that plagued the PT and her bout with seizures.  Father has a brief history of seizures and migraines as he stated while talking with the Chaplain.  No follow up needed but PT was very responsive to the prayers.

## 2017-10-09 NOTE — Progress Notes (Signed)
Pt requesting to switch from the patch to nicotine gum. MD notified.

## 2017-10-10 LAB — GLUCOSE, CAPILLARY
GLUCOSE-CAPILLARY: 275 mg/dL — AB (ref 70–99)
GLUCOSE-CAPILLARY: 275 mg/dL — AB (ref 70–99)
GLUCOSE-CAPILLARY: 277 mg/dL — AB (ref 70–99)
GLUCOSE-CAPILLARY: 280 mg/dL — AB (ref 70–99)
GLUCOSE-CAPILLARY: 286 mg/dL — AB (ref 70–99)
GLUCOSE-CAPILLARY: 303 mg/dL — AB (ref 70–99)

## 2017-10-10 MED ORDER — LITHIUM CARBONATE ER 300 MG PO TBCR: 300.0000 mg | EXTENDED_RELEASE_TABLET | Freq: Two times a day (BID) | ORAL | 0 refills | Status: DC

## 2017-10-10 MED ORDER — INSULIN ASPART PROT & ASPART (70-30 MIX) 100 UNIT/ML ~~LOC~~ SUSP
12.0000 [IU] | Freq: Two times a day (BID) | SUBCUTANEOUS | Status: DC
  Administered 2017-10-10: 12 [IU] via SUBCUTANEOUS
  Filled 2017-10-10: qty 10

## 2017-10-10 MED ORDER — SYRINGE 2-3 ML 3 ML MISC: 1.0000 [IU] | Freq: Three times a day (TID) | 0 refills | Status: AC

## 2017-10-10 MED ORDER — INSULIN ASPART PROT & ASPART (70-30 MIX) 100 UNIT/ML ~~LOC~~ SUSP: 12.0000 [IU] | Freq: Two times a day (BID) | SUBCUTANEOUS | 11 refills | Status: AC

## 2017-10-10 MED ORDER — BLOOD GLUCOSE METER KIT: PACK | 0 refills | Status: AC

## 2017-10-10 MED ORDER — INSULIN STARTER KIT- PEN NEEDLES (ENGLISH)
1.0000 | Freq: Once | Status: AC
  Administered 2017-10-10: 1
  Filled 2017-10-10: qty 1

## 2017-10-10 NOTE — Progress Notes (Addendum)
Subjective: Still feels under the weather and states she is having anxiety due to the camera on the LTM. Per nurse this am had  "convulsive seizure like activity X 2. The first incident occurred in pt's room while pt was sleeping. The pt's mother came to the desk asking for the nurse because pt was having seizure. RN came to room, pt had a second convulsive seizure like activity, lasting 8-10 seconds.Pt began to cry slightly but not answer questions. Pt opens eyes to name being called, but does not answer."  Exam: Vitals:   10/10/17 0510 10/10/17 0838  BP: 125/81 106/62  Pulse: 89 84  Resp: (!) 22 20  Temp: (!) 97.5 F (36.4 C) 97.9 F (36.6 C)  SpO2: 96% 94%    Physical Exam  HEENT-  Normocephalic, no lesions, without obvious abnormality.  Normal external eye and conjunctiva.   Extremities- Warm, dry and intact Musculoskeletal-no joint tenderness, deformity or swelling Skin-warm and dry, no hyperpigmentation, vitiligo, or suspicious lesions  Neuro:  Mental Status: Alert, oriented, thought content appropriate.  Speech fluent without evidence of aphasia.  Able to follow 3 step commands without difficulty. Cranial Nerves: II:  Visual fields grossly normal,  III,IV, VI: ptosis not present, extra-ocular motions intact bilaterally pupils equal, round, reactive to light and accommodation V,VII: smile symmetric, facial light touch sensation normal bilaterally VIII: hearing normal bilaterally IX,X: uvula rises midline XI: bilateral shoulder shrug XII: midline tongue extension Motor: Right : Upper extremity   5/5    Left:     Upper extremity   5/5  Lower extremity   5/5     Lower extremity   5/5 Tone and bulk:normal tone throughout; no atrophy noted Sensory: Pinprick and light touch intact throughout, bilaterally Deep Tendon Reflexes: 2+ and symmetric throughout Plantars: Right: downgoing   Left: downgoing Cerebellar: normal finger-to-nose, normal rapid alternating movements and  normal heel-to-shin test   Medications:  Scheduled: . ALPRAZolam  1 mg Oral Q6H  . atenolol  50 mg Oral QPM  . atorvastatin  80 mg Oral QPM  . diltiazem  120 mg Oral QPM  . enoxaparin (LOVENOX) injection  40 mg Subcutaneous Q24H  . insulin aspart  0-15 Units Subcutaneous Q4H  . insulin glargine  15 Units Subcutaneous Daily  . lithium carbonate  300 mg Oral Q12H  . pantoprazole  40 mg Oral Daily  . temazepam  30 mg Oral QHS    Pertinent Labs/Diagnostics: Awaiting LTM reading  Mr Jeri Cos Wo Contrast  Result Date: 10/08/2017 CLINICAL DATA:  Altered mental status. EXAM: MRI HEAD WITHOUT AND WITH CONTRAST TECHNIQUE: Multiplanar, multiecho pulse sequences of the brain and surrounding structures were obtained without and with intravenous contrast. CONTRAST:  1mL MULTIHANCE GADOBENATE DIMEGLUMINE 529 MG/ML IV SOLN COMPARISON:  Head CT 10/07/2017 FINDINGS: BRAIN: There is no acute infarct, acute hemorrhage or mass effect. The midline structures are normal. There are no old infarcts. The white matter signal is normal for the patient's age. The CSF spaces are normal for age, with no hydrocephalus. Susceptibility-sensitive sequences show no chronic microhemorrhage or superficial siderosis. No abnormal contrast enhancement. VASCULAR: Major intracranial arterial and venous sinus flow voids are preserved. SKULL AND UPPER CERVICAL SPINE: The visualized skull base, calvarium, upper cervical spine and extracranial soft tissues are normal. SINUSES/ORBITS: Left maxillary sinus mucosal thickening. The orbits are normal. IMPRESSION: Normal brain MRI. Electronically Signed   By: Ulyses Jarred M.D.   On: 10/08/2017 18:43    Etta Quill PA-C [facility]  [phone]   Assessment: 32 year old woman with a past medical history significant for diabetes, medication noncompliance, chronic pain on narcotics and history of seizure in the past with possible pseudoseizures.  The description of the seizures  that brought her into the hospital was back arching, pelvic thrusting, kicking of legs and hypoventilating.  1. A nonepileptic etiology is high on the differential given the described semiology.   2. Patient has had an initial spot EEG which was negative. She was then placed on LTM to capture a spell. Awaiting LTM reading.  3. Psychology has seen her and recommends further outpatient evaluation for her psychological medications.  4.  MRI brain did not show any abnormalities   Recommendations: - Continue LTM for now. May discontinue pending official read, if negative for her 2 spells this AM.  - Seizure precautions - Continue to hold Lamictal. Can be restarted after LTM  Addendum: 1. EEG was negative for electrographic seizures. The patient had 2 clinical spells during the EEG, therefore the EEG confirms pseudoseizures. However, the EEG cannot rule out the possible co-existence of epileptic seizures in this patient. The patient should therefore be instructed not to drive until seizure free for 6 months.  2. Restart Lamictal at home dose of 50 mg po TID.  3. Outpatient Neurology follow up.    Electronically signed: Dr. Kerney Elbe 10/10/2017, 9:37 AM

## 2017-10-10 NOTE — Discharge Summary (Signed)
Physician Discharge Summary  Rebecca Moyer DPO:[id number] DOB: [birth year] DOA: 10/07/2017  PCP: [name], MD  Admit date: 10/07/2017 Discharge date: 10/10/2017  Admitted From: Home  Disposition:  Home   Recommendations for Outpatient Follow-up and new medication changes:  1. Follow Dr. Jimmye Norman in 7 days 2. Continue Lamictal 3. Seizures have been not completely ruled out, continue seizure precautions.  4. Patient has been placed on Lithium per psychiatry  Per [facility] statutes, patients with seizures are not allowed to drive until  they have been seizure-free for six months. Use caution when using heavy equipment or power tools. Avoid working on ladders or at heights. Take showers instead of baths. Ensure the water temperature is not too high on the home water heater. Do not go swimming alone. When caring for infants or small children, sit down when holding, feeding, or changing them to minimize risk of injury to the child in the event you have a seizure. Also, Maintain good sleep hygiene. Avoid alcohol   Home Health: no   Equipment/Devices: no    Discharge Condition: stable CODE STATUS: full  Diet recommendation: Heart healthy and diabetic prudent.   Brief/Interim Summary: 32 year old female who presented with seizures.  She does have the significant past medical history of type 2 diabetes mellitus, chronic pain syndrome, pseudo-seizures.  Reported increase in seizure frequency, apparently she had about 6 episodes on the day of admission.  On the initial physical examination blood pressure 111/64, heart rate 85, temperature 99, respiratory rate 16, oxygen saturation 100%, moist mucous membranes, lungs clear to auscultation bilaterally, heart S1-S2 present rhythmic, abdomen soft nontender, no lower extremity edema, neurologically patient was nonfocal.  Sodium 135, potassium 3.9, chloride 105, bicarb 17, glucose 293, BUN 8, creatinine 0.72, white count 8.4, hemoglobin 15.4,  hematocrit 40.5, platelets 265.  Analysis specific gravity 1.030, glucose greater than 500, negative protein.  Drug screen positive for benzodiazepines.  Head CT negative for acute changes.  EKG sinus rhythm, QTc 495 ms.   Patient was admitted to the hospital with the working diagnosis of uncontrolled seizures to rule out pseudoseizures.  1.  Psychogenic nonepileptic seizures/ seizures.  Patient was admitted to the medical ward, she was placed on a remote telemetry monitor, underwent electroencephalography which resulted with normal awake, sleep and activation procedures.  For further work-up she underwent long-term monitoring EEG for 24 hours.  Lamictal was held.  17-hour intensive EEG monitoring simultaneous video monitoring recorded 1 stereotypical event, did not correlate for seizure activity.  Even though test was negative, seizures cannot be completed to ruled out, patient will continue Lamictal and have follow-up as an outpatient.  Continue seizure precautions as above.  2.  Uncontrolled type 2 diabetes mellitus/ hemoglobin A1c 11.4.  Patient was placed on insulin regimen in the hospital, basal and sliding scale.  Capillary glucose ranged around 250's.  Patient will be discharged on insulin regimen, 70/30 units 10 units bid, and will need close follow up.   3.  Hypertension.  Continue blood pressure control with atenolol and diltiazem.  4.  Depression/anxiety.  Patient was seen by psychiatry with recommendations to continue Restoril, as needed alprazolam, start lithium 300 g twice daily for mood stabilization.  Will need close follow-up as an outpatient.  TSH 1.8  Discharge Diagnoses:  Principal Problem:   Moderate bipolar I disorder, most recent episode depressed ([place]) Active Problems:   Depression   Hyperglycemia   Seizure (HCC)   Insomnia   GERD (gastroesophageal reflux disease)  Discharge Instructions   Allergies as of 10/10/2017      Reactions   Fentanyl Palpitations,  Shortness Of Breath   Pt states throat swells and heart races   Penicillins Itching, Swelling   Facial swelling Has patient had a PCN reaction causing immediate rash, facial/tongue/throat swelling, SOB or lightheadedness with hypotension: Yes Has patient had a PCN reaction causing severe rash involving mucus membranes or skin necrosis: No Has patient had a PCN reaction that required hospitalization Yes Has patient had a PCN reaction occurring within the last 10 years: No If all of the above answers are "NO", then may proceed with Cephalosporin use.   Triamterene Shortness Of Breath, Other (See Comments)   Blisters on stomach   Naproxen Itching   SKIN BURNING   Ultram [tramadol] Hives      Medication List    STOP taking these medications   doxycycline 100 MG capsule Commonly known as:  VIBRAMYCIN   ibuprofen 600 MG tablet Commonly known as:  ADVIL,MOTRIN   loperamide 2 MG capsule Commonly known as:  IMODIUM   nystatin 100000 UNIT/ML suspension Commonly known as:  MYCOSTATIN   ondansetron 4 MG disintegrating tablet Commonly known as:  ZOFRAN ODT     TAKE these medications   2-3CC SYRINGE 3 ML Misc 1 Units by Does not apply route 3 (three) times daily.   albuterol (2.5 MG/3ML) 0.083% nebulizer solution Commonly known as:  PROVENTIL Take 2.5 mg by nebulization every 8 (eight) hours as needed. Shortness of breath   albuterol 108 (90 Base) MCG/ACT inhaler Commonly known as:  PROVENTIL HFA;VENTOLIN HFA Inhale 2 puffs into the lungs every 6 (six) hours as needed for wheezing or shortness of breath.   ALPRAZolam 1 MG tablet Commonly known as:  XANAX Take 1 mg by mouth 4 (four) times daily.   aspirin EC 81 MG tablet Take 81 mg by mouth daily.   atenolol 50 MG tablet Commonly known as:  TENORMIN Take 50 mg by mouth every evening.   atorvastatin 80 MG tablet Commonly known as:  LIPITOR Take 80 mg by mouth every evening.   blood glucose meter kit and  supplies Dispense based on patient and insurance preference. Use up to four times daily as directed. (FOR ICD-10 E10.9, E11.9).   cycloSPORINE 0.05 % ophthalmic emulsion Commonly known as:  RESTASIS Place 1 drop into both eyes 2 (two) times daily.   diltiazem 120 MG 24 hr capsule Commonly known as:  CARDIZEM CD Take 1 capsule by mouth every evening.   glucose monitoring kit monitoring kit 1 each by Does not apply route as needed for other.   insulin aspart protamine- aspart (70-30) 100 UNIT/ML injection Commonly known as:  NOVOLOG MIX 70/30 Inject 0.12 mLs (12 Units total) into the skin 2 (two) times daily with a meal.   lamoTRIgine 25 MG tablet Commonly known as:  LAMICTAL Take 50 mg by mouth 3 (three) times daily.   lithium carbonate 300 MG CR tablet Commonly known as:  LITHOBID Take 1 tablet (300 mg total) by mouth every 12 (twelve) hours.   omeprazole 40 MG capsule Commonly known as:  PRILOSEC Take 40 mg by mouth 3 (three) times daily.   oxyCODONE-acetaminophen 10-325 MG tablet Commonly known as:  PERCOCET Take 1 tablet by mouth every 4 (four) hours as needed for pain.   temazepam 30 MG capsule Commonly known as:  RESTORIL Take 1 capsule by mouth at bedtime.       Allergies  Allergen  Reactions  . Fentanyl Palpitations and Shortness Of Breath    Pt states throat swells and heart races  . Penicillins Itching and Swelling    Facial swelling Has patient had a PCN reaction causing immediate rash, facial/tongue/throat swelling, SOB or lightheadedness with hypotension: Yes Has patient had a PCN reaction causing severe rash involving mucus membranes or skin necrosis: No Has patient had a PCN reaction that required hospitalization Yes Has patient had a PCN reaction occurring within the last 10 years: No If all of the above answers are "NO", then may proceed with Cephalosporin use.   . Triamterene Shortness Of Breath and Other (See Comments)    Blisters on stomach  .  Naproxen Itching    SKIN BURNING  . Ultram [Tramadol] Hives    Consultations:  Neurology    Procedures/Studies: Ct Head Wo Contrast  Result Date: 10/07/2017 CLINICAL DATA:  Seizure EXAM: CT HEAD WITHOUT CONTRAST TECHNIQUE: Contiguous axial images were obtained from the base of the skull through the vertex without intravenous contrast. COMPARISON:  07/13/2016 FINDINGS: Brain: No evidence of acute infarction, hemorrhage, hydrocephalus, extra-axial collection or mass lesion/mass effect. Vascular: No hyperdense vessel or unexpected calcification. Skull: Normal. Negative for fracture or focal lesion. Sinuses/Orbits: Near complete opacification of left maxillary sinus. No acute orbital abnormality. Other: Calcified scalp mass in the right posterior parietal region. IMPRESSION: Negative non contrasted CT appearance of the brain Electronically Signed   By: Donavan Foil M.D.   On: 10/07/2017 20:36   Mr Jeri Cos EL Contrast  Result Date: 10/08/2017 CLINICAL DATA:  Altered mental status. EXAM: MRI HEAD WITHOUT AND WITH CONTRAST TECHNIQUE: Multiplanar, multiecho pulse sequences of the brain and surrounding structures were obtained without and with intravenous contrast. CONTRAST:  25m MULTIHANCE GADOBENATE DIMEGLUMINE 529 MG/ML IV SOLN COMPARISON:  Head CT 10/07/2017 FINDINGS: BRAIN: There is no acute infarct, acute hemorrhage or mass effect. The midline structures are normal. There are no old infarcts. The white matter signal is normal for the patient's age. The CSF spaces are normal for age, with no hydrocephalus. Susceptibility-sensitive sequences show no chronic microhemorrhage or superficial siderosis. No abnormal contrast enhancement. VASCULAR: Major intracranial arterial and venous sinus flow voids are preserved. SKULL AND UPPER CERVICAL SPINE: The visualized skull base, calvarium, upper cervical spine and extracranial soft tissues are normal. SINUSES/ORBITS: Left maxillary sinus mucosal thickening. The  orbits are normal. IMPRESSION: Normal brain MRI. Electronically Signed   By: KUlyses JarredM.D.   On: 10/08/2017 18:43       Subjective: Patient is feeling better, no nausea or vomiting, no chest pain or dyspnea.   Discharge Exam: Vitals:   10/10/17 0838 10/10/17 1147  BP: 106/62 124/73  Pulse: 84 82  Resp: 20 20  Temp: 97.9 F (36.6 C) 97.6 F (36.4 C)  SpO2: 94% 96%   Vitals:   10/10/17 0310 10/10/17 0510 10/10/17 0838 10/10/17 1147  BP: 119/69 125/81 106/62 124/73  Pulse: 71 89 84 82  Resp:  (!) _0 Temp: 97.9 F (36.6 C) (!) 97.5 F (36.4 C) 97.9 F (36.6 C) 97.6 F (36.4 C)  TempSrc: Oral Axillary Oral Oral  SpO2: 91% 96% 94% 96%  Weight:      Height:        General: Not in pain or dyspnea Neurology: Awake and alert, non focal  E ENT: no pallor, no icterus, oral mucosa moist Cardiovascular: No JVD. S1-S2 present, rhythmic, no gallops, rubs, or murmurs. No lower extremity edema. Pulmonary: vesicular  breath sounds bilaterally, adequate air movement, no wheezing, rhonchi or rales. Gastrointestinal. Abdomen with no organomegaly, non tender, no rebound or guarding Skin. No rashes Musculoskeletal: no joint deformities   The results of significant diagnostics from this hospitalization (including imaging, microbiology, ancillary and laboratory) are listed below for reference.     Microbiology: No results found for this or any previous visit (from the past 240 hour(s)).   Labs: BNP (last 3 results) No results for input(s): BNP in the last 8760 hours. Basic Metabolic Panel: Recent Labs  Lab 10/07/17 1843 10/07/17 1853 10/08/17 1238 10/09/17 0551  NA 135 138 136 137  K 3.9 3.7 4.3 4.2  CL 105 108 107 105  CO2 17*  --  18* 22  GLUCOSE 293* 306* 278* 275*  BUN _0 CREATININE 0.72 0.40* 0.63 0.63  CALCIUM 9.2  --  8.9 9.0   Liver Function Tests: Recent Labs  Lab 10/07/17 1843 10/09/17 0551  AST 41 39  ALT 44 38  ALKPHOS 49 43  BILITOT  0.7 1.1  PROT 6.6 5.9*  ALBUMIN 4.0 3.5   No results for input(s): LIPASE, AMYLASE in the last 168 hours. No results for input(s): AMMONIA in the last 168 hours. CBC: Recent Labs  Lab 10/07/17 1843 10/07/17 1853 10/09/17 0551  WBC 8.4  --  8.0  NEUTROABS 4.8  --   --   HGB 15.4* 14.3 15.1*  HCT 42.5 42.0 42.3  MCV 86.0  --  87.4  PLT 265  --  275   Cardiac Enzymes: No results for input(s): CKTOTAL, CKMB, CKMBINDEX, TROPONINI in the last 168 hours. BNP: Invalid input(s): POCBNP CBG: Recent Labs  Lab 10/09/17 2022 10/10/17 0003 10/10/17 0436 10/10/17 0844 10/10/17 1125  GLUCAP 345* 275* 280* 277* 275*   D-Dimer No results for input(s): DDIMER in the last 72 hours. Hgb A1c Recent Labs    10/09/17 0551  HGBA1C 11.4*   Lipid Profile No results for input(s): CHOL, HDL, LDLCALC, TRIG, CHOLHDL, LDLDIRECT in the last 72 hours. Thyroid function studies Recent Labs    10/08/17 1610  TSH 1.874   Anemia work up No results for input(s): VITAMINB12, FOLATE, FERRITIN, TIBC, IRON, RETICCTPCT in the last 72 hours. Urinalysis    Component Value Date/Time   COLORURINE YELLOW 10/07/2017 2151   APPEARANCEUR CLEAR 10/07/2017 2151   APPEARANCEUR Hazy 08/02/2013 1749   LABSPEC 1.030 10/07/2017 2151   LABSPEC 1.028 08/02/2013 1749   PHURINE 6.0 10/07/2017 2151   GLUCOSEU >=500 (A) 10/07/2017 2151   GLUCOSEU Negative 08/02/2013 1749   HGBUR NEGATIVE 10/07/2017 2151   BILIRUBINUR NEGATIVE 10/07/2017 2151   BILIRUBINUR Negative 08/02/2013 1749   KETONESUR 5 (A) 10/07/2017 2151   PROTEINUR NEGATIVE 10/07/2017 2151   UROBILINOGEN 0.2 12/17/2014 0647   NITRITE NEGATIVE 10/07/2017 2151   LEUKOCYTESUR NEGATIVE 10/07/2017 2151   LEUKOCYTESUR Trace 08/02/2013 1749   Sepsis Labs Invalid input(s): PROCALCITONIN,  WBC,  LACTICIDVEN Microbiology No results found for this or any previous visit (from the past 240 hour(s)).   Time coordinating discharge: 45  minutes  SIGNED:   Tawni Millers, MD  [facility] 10/10/2017, 2:19 PM Pager [phone]  If 7PM-7AM, please contact night-coverage [email or web address] Password [email or web address]

## 2017-10-10 NOTE — Care Management Note (Signed)
Case Management Note  Patient Details  Name: Rebecca Moyer MRN: [id number] Date of Birth: 07/18/1985  Subjective/Objective:  Pt in to r/o seizures. She is from home with spouse.                  Action/Plan: CM consulted for medication assistance. Pt has Medicaid and CM can not provide any addition assistance. Pt states she has had no trouble affording her meds in the past with her medicaid. She states she thinks MD placed consult for instructions on giving herself insulin. CM notified the bedside RN that she was requesting instructions on administering insulin.  Pt has PCP, insurance and transportation home.   Expected Discharge Date:  10/10/17               Expected Discharge Plan:  Home/Self Care  In-House Referral:     Discharge planning Services  CM Consult  Post Acute Care Choice:    Choice offered to:     DME Arranged:    DME Agency:     HH Arranged:    HH Agency:     Status of Service:  Completed, signed off  If discussed at [employer] of Stay Meetings, dates discussed:    Additional Comments:  Pollie Friar, RN 10/10/2017, 4:21 PM

## 2017-10-10 NOTE — Procedures (Signed)
Electroencephalography report.  Long-term monitoring.  Data acquisition: [employer] 10-20 for electrodes placement.  18 channels EEG with additional EKG channel.  CPT code [phone]  Number of study day 1  Recording begins 10/09/2017 at 1420 Recording in 10/10/2017 at 7:55 AM  This continues EEG monitoring was requested for this 32 years old patient with spells accompanied by shaking unresponsiveness to rule out clinical and subclinical electrographic seizures.  This EEG was obtained while patient awake drowsiness sleep  Once.  Typical event of interest was recorded around 4:55 AM.  Patient was awake prior to event of interest.  There is a whole body shaking and pelvic thrusting and unresponsiveness lasting approximately 10 minutes with waxing and waning qualities.  Patient did not respond to follow commands when tested.  Electrographically normal waking background activities present prior during and after this event of interest.  That was not a seizure.  Interictal EEG was normal during wakefulness drowsiness and sleep.  Waking background activities marked by 9 to 10 cps posterior dominant rhythm.  Normal drowsiness and sleep architecture.  Clinical interpretation: This 17 hours of intensive EEG monitoring with simultaneous video monitoring recorded to 1 stereotypical events of interest marked by body shaking and unresponsiveness.  Electrographically there was no EEG correlate to suggest seizure.  Normal background activities was present prior during and after this event of interest.  This event was not seizure and most consistent with psychogenic nonepileptic spell.  Clinical correlation is advised.

## 2017-10-10 NOTE — Plan of Care (Signed)
  RD consulted for nutrition education regarding diabetes.   32 year old woman with a past medical history significant for diabetes, medication noncompliance, chronic pain on narcotics and history of seizure in the past with possible pseudoseizures  Patient was very adamant in stating she needs an endocrinologist to help her manage her diabetes. She stopped taking her insulin and metformin at home because she was "tired of feeling bad." Wants to be on an insulin pump and believes this will solve her diabetes problems. States she knows what she needs to eat at home and follows this at times, but recently states it has been hard to eat healthy due to lack of money. She did not elaborate on this statement. Seems to enjoy subway. She reports really enjoying vegetables and that she has no problem eating more of those than other food items.  Lab Results  Component Value Date   HGBA1C 11.4 (H) 10/09/2017    RD provided "Carbohydrate Counting for People with Diabetes" handout from the Academy of Nutrition and Dietetics. Discussed different food groups and their effects on blood sugar, emphasizing carbohydrate-containing foods. Provided list of carbohydrates and recommended serving sizes of common foods.  Discussed importance of controlled and consistent carbohydrate intake throughout the day. Provided examples of ways to balance meals/snacks and encouraged intake of high-fiber, whole grain complex carbohydrates. Teach back method used.  Expect fair compliance.  Body mass index is 32.02 kg/m. Pt meets criteria for obesity class I based on current BMI.  Current diet order is carb modified , patient is consuming approximately 100% of meals at this time. Labs and medications reviewed. No further nutrition interventions warranted at this time. RD contact information provided. If additional nutrition issues arise, please re-consult RD.  Rebecca Moyer. Rebecca Cropp, MS, RD LDN Inpatient Clinical Dietitian Pager  [phone]

## 2017-10-10 NOTE — Progress Notes (Signed)
Pt discharge home with spouse to transport her home. Pt IV removed; pre-medicated prior to discharge upon request. Pt educated on insulin administration and pt able to demonstrate back to RN by drawing from vial and self administering. Pt denies any questions or concerns. Pt handed her prescription for insulin kit and start kit handed to pt. Pt to pick up electronically sent prescriptions from preferred pharmacy on file. Pt offered wheelchair but declined and ambulated off unit with spouse and belonging to the side. Delia Heady RN

## 2017-10-10 NOTE — Progress Notes (Signed)
Inpatient Diabetes Program Recommendations  AACE/ADA: New Consensus Statement on Inpatient Glycemic Control (2015)  Target Ranges:  Prepandial:   less than 140 mg/dL      Peak postprandial:   less than 180 mg/dL (1-2 hours)      Critically ill patients:  140 - 180 mg/dL   Lab Results  Component Value Date   GLUCAP 275 (H) 10/10/2017   HGBA1C 11.4 (H) 10/09/2017    Review of Glycemic Control Results for MARYBELLA, ETHIER (MRN [id number]) as of 10/10/2017 13:30  Ref. Range 10/09/2017 20:22 10/10/2017 00:03 10/10/2017 04:36 10/10/2017 08:44 10/10/2017 11:25  Glucose-Capillary Latest Ref Range: 70 - 99 mg/dL 345 (H) 275 (H) 280 (H) 277 (H) 275 (H)    Diabetes history: Type 2 DM  Outpatient Diabetes medications: None per patient Current orders for Inpatient glycemic control:  Novolog moderate q 4 hours, Lantus 15 units daily  Inpatient Diabetes Program Recommendations: Please consider increasing Lantus to 25 units daily.  Also consider changing Novolog correction to tid with meals and HS.  Patient would likely benefit from the addition of Novolog meal coverage as well. Text paged MD. Thanks,  Adah Perl, RN, BC-ADM Inpatient Diabetes Coordinator Pager [phone] (8a-5p)

## 2017-10-10 NOTE — Progress Notes (Signed)
Pt had convulsive seizure like activity X 2. The first incident occurred in pt's room while pt was sleeping. The pt's mother came to the desk asking for the nurse because pt was having seizure. RN came to room, pt had a second convulsive seizure like activity, lasting 8-10 seconds. VS stable. EEG event button pushed. Pt began to cry slightly but not answer questions. Pt opens eyes to name being called, but does not answer. Pt now sleeping.MD notified. Will continue to monitor.

## 2017-10-10 NOTE — Progress Notes (Signed)
LTM discontinued. No skin breakdown was seen. 

## 2017-10-10 NOTE — Progress Notes (Signed)
CSW met with patient to provide resources on grief counseling, per psychiatrist request. CSW provided resources on [facility] counseling resources, including individual counseling and support groups. Patient was very appreciative of resources and said she'd reach out.   Patient also asked about "pretending" to smoke a cigarette; CSW checked with nursing staff and patient is unable to do so. CSW informed patient.  CSW signing off.  Laveda Abbe, [place] Clinical Social Worker [phone]

## 2017-10-11 ENCOUNTER — Other Ambulatory Visit: Payer: Self-pay

## 2017-10-11 ENCOUNTER — Encounter (HOSPITAL_COMMUNITY): Payer: Self-pay | Admitting: *Deleted

## 2017-10-11 ENCOUNTER — Emergency Department (HOSPITAL_COMMUNITY)
Admission: EM | Admit: 2017-10-11 | Discharge: 2017-10-12 | Disposition: A | Discharge status: Home / Self Care | Payer: Medicaid Other | Attending: Emergency Medicine | Admitting: Emergency Medicine

## 2017-10-11 DIAGNOSIS — Z7982 Long term (current) use of aspirin: Secondary | ICD-10-CM | POA: Diagnosis not present

## 2017-10-11 DIAGNOSIS — F17210 Nicotine dependence, cigarettes, uncomplicated: Secondary | ICD-10-CM | POA: Insufficient documentation

## 2017-10-11 DIAGNOSIS — J45909 Unspecified asthma, uncomplicated: Secondary | ICD-10-CM | POA: Insufficient documentation

## 2017-10-11 DIAGNOSIS — Z79899 Other long term (current) drug therapy: Secondary | ICD-10-CM | POA: Insufficient documentation

## 2017-10-11 DIAGNOSIS — E119 Type 2 diabetes mellitus without complications: Secondary | ICD-10-CM | POA: Diagnosis not present

## 2017-10-11 DIAGNOSIS — L03114 Cellulitis of left upper limb: Secondary | ICD-10-CM | POA: Insufficient documentation

## 2017-10-11 DIAGNOSIS — R2232 Localized swelling, mass and lump, left upper limb: Secondary | ICD-10-CM | POA: Diagnosis present

## 2017-10-11 LAB — CBC WITH DIFFERENTIAL/PLATELET
Abs Immature Granulocytes: 0 10*3/uL (ref 0.0–0.1)
Basophils Absolute: 0.1 10*3/uL (ref 0.0–0.1)
Basophils Relative: 1 %
EOS PCT: 3 %
Eosinophils Absolute: 0.3 10*3/uL (ref 0.0–0.7)
HEMATOCRIT: 46.9 % — AB (ref 36.0–46.0)
HEMOGLOBIN: 16.2 g/dL — AB (ref 12.0–15.0)
IMMATURE GRANULOCYTES: 0 %
LYMPHS ABS: 3.8 10*3/uL (ref 0.7–4.0)
LYMPHS PCT: 34 %
MCH: 30.3 pg (ref 26.0–34.0)
MCHC: 34.5 g/dL (ref 30.0–36.0)
MCV: 87.7 fL (ref 78.0–100.0)
MONOS PCT: 5 %
Monocytes Absolute: 0.6 10*3/uL (ref 0.1–1.0)
Neutro Abs: 6.3 10*3/uL (ref 1.7–7.7)
Neutrophils Relative %: 57 %
Platelets: 262 10*3/uL (ref 150–400)
RBC: 5.35 MIL/uL — AB (ref 3.87–5.11)
RDW: 12.4 % (ref 11.5–15.5)
WBC: 11.1 10*3/uL — AB (ref 4.0–10.5)

## 2017-10-11 LAB — I-STAT BETA HCG BLOOD, ED (MC, WL, AP ONLY): I-stat hCG, quantitative: <5 m[IU]/mL (ref <5)

## 2017-10-11 LAB — COMPREHENSIVE METABOLIC PANEL
ALBUMIN: 4.1 g/dL (ref 3.5–5.0)
ALT: 51 U/L — AB (ref 0–44)
AST: 35 U/L (ref 15–41)
Alkaline Phosphatase: 56 U/L (ref 38–126)
Anion gap: 11 (ref 5–15)
BUN: 11 mg/dL (ref 6–20)
CO2: 26 mmol/L (ref 22–32)
CREATININE: 0.68 mg/dL (ref 0.44–1.00)
Calcium: 9.8 mg/dL (ref 8.9–10.3)
Chloride: 98 mmol/L (ref 98–111)
GFR calc Af Amer: >60 mL/min (ref >60)
GFR calc non Af Amer: >60 mL/min (ref >60)
GLUCOSE: 298 mg/dL — AB (ref 70–99)
POTASSIUM: 4.4 mmol/L (ref 3.5–5.1)
SODIUM: 135 mmol/L (ref 135–145)
Total Bilirubin: 1.3 mg/dL — ABNORMAL HIGH (ref 0.3–1.2)
Total Protein: 6.6 g/dL (ref 6.5–8.1)

## 2017-10-11 LAB — I-STAT CG4 LACTIC ACID, ED: Lactic Acid, Venous: 1.74 mmol/L (ref 0.5–1.9)

## 2017-10-11 NOTE — ED Triage Notes (Signed)
Pt reports recently discharged from hospital and had iv in right ac. Now has redness swelling and severe pain to left ac. Reports having fever earlier today. Vss.

## 2017-10-11 NOTE — ED Triage Notes (Signed)
Pt continues to go outside to smoke 

## 2017-10-11 NOTE — ED Notes (Signed)
Pt to NF stating "I am dying, it is your fault my arm is like this, you did this to me" Pt requesting to know [facility] wait time.

## 2017-10-11 NOTE — ED Notes (Signed)
Pt requesting "patient experience" number

## 2017-10-12 MED ORDER — CLINDAMYCIN HCL 300 MG PO CAPS: 300.0000 mg | ORAL_CAPSULE | Freq: Four times a day (QID) | ORAL | 0 refills | Status: DC

## 2017-10-12 MED ORDER — CLINDAMYCIN PHOSPHATE 600 MG/50ML IV SOLN
600.0000 mg | Freq: Once | INTRAVENOUS | Status: AC
  Administered 2017-10-12: 600 mg via INTRAVENOUS
  Filled 2017-10-12: qty 50

## 2017-10-12 MED ORDER — MORPHINE SULFATE (PF) 4 MG/ML IV SOLN
4.0000 mg | Freq: Once | INTRAVENOUS | Status: AC
  Administered 2017-10-12: 4 mg via INTRAVENOUS
  Filled 2017-10-12: qty 1

## 2017-10-12 NOTE — ED Provider Notes (Signed)
[facility] EMERGENCY DEPARTMENT Provider Note   CSN: [id number] Arrival date & time: 10/11/17  1739     History   Chief Complaint Chief Complaint  Patient presents with  . Arm Problem    HPI Rebecca Moyer is a 32 y.o. female.  Patient is a 32 year old female with past medical history of diabetes, asthma, hypertension, bipolar disorder, hidradenitis.  She presents today for evaluation of pain, redness, and swelling to her left arm.  She was recently admitted for seizures/pseudoseizures and had an IV at this site.  Since returning home, she has developed increased pain and redness at the IV insertion site.  She denies any fevers or chills.  The history is provided by the patient.    Past Medical History:  Diagnosis Date  . Anxiety   . Arthritis    knees  . Asthma 06/07/11  . Asthma    prn inhaler  . Bipolar disorder ([place])   . Depression 06/07/11  . Depression   . Diabetes mellitus without complication ([place])   . Difficulty swallowing solids    due to GERD and slow motility of esophagus, per pt.  Marland Kitchen GERD (gastroesophageal reflux disease)   . H/O amenorrhea 01/27/04  . H/O varicella   . Hidradenitis 09/2014   bilateral axilla, left buttock, left groin  . Hypertension 06/07/11  . Hypertension    states under control with med., has been on med. x 6 yr.  . Irregular periods/menstrual cycles 05/22/04  . 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 Nasal congestion 09/27/2014  . Non-insulin dependent type 2 diabetes mellitus ([place])   . Obesity 06/07/11  . PCOS (polycystic ovarian syndrome) 06/07/11  . Schizophrenia ([place])   . Sleep apnea    does not use CPAP every night, per pt.  . Smoker 06/07/11  . Tachycardia    no cardiologist, per pt.    Patient Active Problem List   Diagnosis Date Noted  . Moderate bipolar I disorder, most recent episode depressed ([place])   . Hyperglycemia 10/07/2017  . Seizure ([place]) 10/07/2017  . Insomnia 10/07/2017  . GERD  (gastroesophageal reflux disease) 10/07/2017  . Hidradenitis 09/28/2014  . 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 Depression 05/09/2006  . ATTENTION DEFICIT, W/HYPERACTIVITY 05/09/2006    Past Surgical History:  Procedure Laterality Date  . adnoids  06/07/11  . HYDRADENITIS EXCISION Bilateral 09/28/2014   Procedure: EXCISION HIDRADENITIS BILATERAL AXILLARY;  Surgeon: Armandina Gemma, MD;  Location: [facility];  Service: General;  Laterality: Bilateral;  . TONSILLECTOMY  06/07/11  . TONSILLECTOMY AND ADENOIDECTOMY  06/26/2000  . WISDOM TOOTH EXTRACTION  06/07/11  . WISDOM TOOTH EXTRACTION       OB History    Gravida  0   Para  0   Term  0   Preterm  0   AB  0   Living        SAB  0   TAB  0   Ectopic  0   Multiple      Live Births               Home Medications    Prior to Admission medications   Medication Sig Start Date End Date Taking? Authorizing Provider  albuterol (PROVENTIL HFA;VENTOLIN HFA) 108 (90 BASE) MCG/ACT inhaler Inhale 2 puffs into the lungs every 6 (six) hours as needed for wheezing or shortness of breath. 01/01/14   Varney Biles, MD  albuterol (PROVENTIL) (2.5 MG/3ML) 0.083% nebulizer  solution Take 2.5 mg by nebulization every 8 (eight) hours as needed. Shortness of breath    [provider]  ALPRAZolam (XANAX) 1 MG tablet Take 1 mg by mouth 4 (four) times daily.     [provider]  aspirin EC 81 MG tablet Take 81 mg by mouth daily.    [provider]  atenolol (TENORMIN) 50 MG tablet Take 50 mg by mouth every evening.  01/20/14   [provider]  atorvastatin (LIPITOR) 80 MG tablet Take 80 mg by mouth every evening.     [provider]  blood glucose meter kit and supplies Dispense based on patient and insurance preference. Use up to four times daily as directed. (FOR ICD-10 E10.9, E11.9). 10/10/17   Arrien, Jimmy Picket, MD  cycloSPORINE (RESTASIS) 0.05 % ophthalmic emulsion Place 1  drop into both eyes 2 (two) times daily.    [provider]  diltiazem (CARDIZEM CD) 120 MG 24 hr capsule Take 1 capsule by mouth every evening.  03/13/16   [provider]  glucose monitoring kit (FREESTYLE) monitoring kit 1 each by Does not apply route as needed for other. 10/20/15   Rancour, Annie Main, MD  insulin aspart protamine- aspart (NOVOLOG MIX 70/30) (70-30) 100 UNIT/ML injection Inject 0.12 mLs (12 Units total) into the skin 2 (two) times daily with a meal. 10/10/17   Arrien, Jimmy Picket, MD  lamoTRIgine (LAMICTAL) 25 MG tablet Take 50 mg by mouth 3 (three) times daily.    [provider]  lithium carbonate (LITHOBID) 300 MG CR tablet Take 1 tablet (300 mg total) by mouth every 12 (twelve) hours. 10/10/17 11/09/17  Arrien, Jimmy Picket, MD  omeprazole (PRILOSEC) 40 MG capsule Take 40 mg by mouth 3 (three) times daily.     [provider]  oxyCODONE-acetaminophen (PERCOCET) 10-325 MG tablet Take 1 tablet by mouth every 4 (four) hours as needed for pain.  10/07/15   [provider]  Syringe, Disposable, (2-3CC SYRINGE) 3 ML MISC 1 Units by Does not apply route 3 (three) times daily. 10/10/17   Arrien, Jimmy Picket, MD  temazepam (RESTORIL) 30 MG capsule Take 1 capsule by mouth at bedtime. 10/09/15   [provider]    Family History Family History  Problem Relation Age of Onset  . Graves' disease Mother     Social History Social History   Tobacco Use  . Smoking status: Current Every Day Smoker    Packs/day: 0.50    Years: 8.00    Pack years: 4.00    Types: Cigarettes  . Smokeless tobacco: Never Used  Substance Use Topics  . Alcohol use: No  . Drug use: No     Allergies   Fentanyl; Penicillins; Triamterene; Naproxen; and Ultram [tramadol]   Review of Systems Review of Systems  All other systems reviewed and are negative.    Physical Exam Updated Vital Signs BP (!) 139/94 (BP Location: Right Arm)   Pulse 97    Temp (!) 97.5 F (36.4 C) (Oral)   Resp 14   SpO2 97%   Physical Exam  Constitutional: She is oriented to person, place, and time. She appears well-developed and well-nourished. No distress.  HENT:  Head: Normocephalic and atraumatic.  Neck: Normal range of motion. Neck supple.  Cardiovascular: Normal rate and regular rhythm. Exam reveals no gallop and no friction rub.  No murmur heard. Pulmonary/Chest: Effort normal and breath sounds normal. No respiratory distress. She has no wheezes.  Abdominal: Soft. Bowel  sounds are normal. She exhibits no distension. There is no tenderness.  Musculoskeletal: Normal range of motion.  Neurological: She is alert and oriented to person, place, and time.  Skin: Skin is warm and dry. She is not diaphoretic.  The left antecubital region has the IV insertion site present with purulent drainage.  There is surrounding erythema, however no induration or fluctuance.  Distal pulses are easily palpable and motor and sensation are intact throughout the entire hand.  Nursing note and vitals reviewed.    ED Treatments / Results  Labs (all labs ordered are listed, but only abnormal results are displayed) Labs Reviewed  COMPREHENSIVE METABOLIC PANEL - Abnormal; Notable for the following components:      Result Value   Glucose, Bld 298 (*)    ALT 51 (*)    Total Bilirubin 1.3 (*)    All other components within normal limits  CBC WITH DIFFERENTIAL/PLATELET - Abnormal; Notable for the following components:   WBC 11.1 (*)    RBC 5.35 (*)    Hemoglobin 16.2 (*)    HCT 46.9 (*)    All other components within normal limits  I-STAT CG4 LACTIC ACID, ED  I-STAT BETA HCG BLOOD, ED ([facility], [facility], [facility] ONLY)    EKG None  Radiology No results found.  Procedures Procedures (including critical care time)  Medications Ordered in ED Medications  clindamycin (CLEOCIN) IVPB 600 mg (has no administration in time range)  morphine 4 MG/ML injection 4 mg (has no  administration in time range)     Initial Impression / Assessment and Plan / ED Course  I have reviewed the triage vital signs and the nursing notes.  Pertinent labs & imaging results that were available during my care of the patient were reviewed by me and considered in my medical decision making (see chart for details).  IV clindamycin given for what appears to be cellulitis from IV catheter recent hospitalization.  She will be discharged with warm soaks, continued clindamycin, and follow-up as needed.  Final Clinical Impressions(s) / ED Diagnoses   Final diagnoses:  None    ED Discharge Orders    None       Veryl Speak, MD 10/12/17 [phone]

## 2017-10-12 NOTE — Discharge Instructions (Addendum)
Clindamycin as prescribed.  Apply warm compresses as frequently as possible for the next several days.  Return to the emergency department if symptoms significantly worsen or change.

## 2017-12-05 ENCOUNTER — Institutional Professional Consult (permissible substitution): Payer: Self-pay | Admitting: Neurology

## 2017-12-17 ENCOUNTER — Institutional Professional Consult (permissible substitution): Payer: Self-pay | Admitting: Neurology

## 2018-01-14 ENCOUNTER — Encounter: Payer: Self-pay | Admitting: Neurology

## 2018-01-14 ENCOUNTER — Institutional Professional Consult (permissible substitution): Payer: Self-pay | Admitting: Neurology

## 2018-01-15 ENCOUNTER — Encounter: Payer: Self-pay | Admitting: Neurology

## 2019-03-25 ENCOUNTER — Ambulatory Visit: Payer: Self-pay | Admitting: Cardiology

## 2019-04-06 ENCOUNTER — Ambulatory Visit: Payer: Self-pay | Admitting: Cardiology

## 2019-04-20 ENCOUNTER — Encounter: Payer: Self-pay | Admitting: Cardiology

## 2019-04-20 ENCOUNTER — Other Ambulatory Visit: Payer: Self-pay

## 2019-04-20 ENCOUNTER — Ambulatory Visit (INDEPENDENT_AMBULATORY_CARE_PROVIDER_SITE_OTHER): Payer: Medicaid Other | Admitting: Cardiology

## 2019-04-20 VITALS — BP 126/84 | HR 78 | Temp 97.8°F | Resp 14 | Ht 69.5 in | Wt 215.5 lb

## 2019-04-20 DIAGNOSIS — F17210 Nicotine dependence, cigarettes, uncomplicated: Secondary | ICD-10-CM

## 2019-04-20 DIAGNOSIS — I209 Angina pectoris, unspecified: Secondary | ICD-10-CM

## 2019-04-20 DIAGNOSIS — R0609 Other forms of dyspnea: Secondary | ICD-10-CM

## 2019-04-20 DIAGNOSIS — E782 Mixed hyperlipidemia: Secondary | ICD-10-CM | POA: Diagnosis not present

## 2019-04-20 DIAGNOSIS — E1165 Type 2 diabetes mellitus with hyperglycemia: Secondary | ICD-10-CM

## 2019-04-20 DIAGNOSIS — F17200 Nicotine dependence, unspecified, uncomplicated: Secondary | ICD-10-CM

## 2019-04-20 DIAGNOSIS — I10 Essential (primary) hypertension: Secondary | ICD-10-CM | POA: Diagnosis not present

## 2019-04-20 DIAGNOSIS — E559 Vitamin D deficiency, unspecified: Secondary | ICD-10-CM

## 2019-04-20 MED ORDER — NITROGLYCERIN 0.4 MG SL SUBL: 0.4000 mg | SUBLINGUAL_TABLET | SUBLINGUAL | 1 refills | Status: AC | PRN

## 2019-04-20 MED ORDER — NICOTINE 14 MG/24HR TD PT24: 14.0000 mg | MEDICATED_PATCH | Freq: Every day | TRANSDERMAL | 1 refills | Status: DC

## 2019-04-20 NOTE — Progress Notes (Signed)
Primary Physician/Referring:  Tamsen Roers, MD  Patient ID: Rebecca Moyer, female    DOB: [birth year], 34 y.o.   MRN: [id number]  Chief Complaint  Patient presents with  . Chest Pain    Eval - ref by Dr. Noberto Retort   HPI:    Rebecca Moyer  is a 34 y.o. 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  For the past few months she has noticed tightness in the middle of the chest described as heaviness to tightness that comes on with activity and is relieved with rest.  Associated shortness of breath.  She has per about 5 minutes with relief of chest discomfort.  She admits to not being on any of psychiatric medications for the past year or 2.  She has made significant lifestyle changes and has lost about 50 pounds in weight over the past 6 months to 8 months, has made changes with her diet as well and exercise.  She is also reduced her smoking from 2 packs of cigarettes a day to 1/2 pack of cigarettes a day.  Past Medical History:  Diagnosis Date  . Anxiety   . Arthritis    knees  . Asthma 06/07/11  . Asthma    prn inhaler  . Bipolar disorder ([place])   . Depression 06/07/11  . Depression   . Diabetes mellitus without complication ([place])   . Difficulty swallowing solids    due to GERD and slow motility of esophagus, per pt.  Marland Kitchen GERD (gastroesophageal reflux disease)   . H/O amenorrhea 01/27/04  . H/O varicella   . Hidradenitis 09/2014   bilateral axilla, left buttock, left groin  . Hypertension 06/07/11  . Hypertension    states under control with med., has been on med. x 6 yr.  . Irregular periods/menstrual cycles 05/22/04  . 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 Nasal congestion 09/27/2014  . Non-insulin dependent type 2 diabetes mellitus ([place])   . Obesity  06/07/11  . PCOS (polycystic ovarian syndrome) 06/07/11  . Schizophrenia ([place])   . Sleep apnea    does not use CPAP every night, per pt.  . Smoker 06/07/11  . Tachycardia    no cardiologist, per pt.   Past Surgical History:  Procedure Laterality Date  . adnoids  06/07/11  . HYDRADENITIS EXCISION Bilateral 09/28/2014   Procedure: EXCISION HIDRADENITIS BILATERAL AXILLARY;  Surgeon: Armandina Gemma, MD;  Location: [facility];  Service: General;  Laterality: Bilateral;  . TONSILLECTOMY  06/07/11  . TONSILLECTOMY AND ADENOIDECTOMY  06/26/2000  . WISDOM TOOTH EXTRACTION  06/07/11  . WISDOM TOOTH EXTRACTION     Social History   Tobacco Use  . Smoking status: Current Every Day Smoker    Packs/day: 0.50    Years: 8.00    Pack years: 4.00    Types: Cigarettes  . Smokeless tobacco: Never Used  Substance Use Topics  . Alcohol use: No    ROS  Review of Systems  Cardiovascular: Positive for chest pain and dyspnea on exertion. Negative for leg swelling.  Gastrointestinal: Negative for melena.  Psychiatric/Behavioral: Negative for substance abuse and suicidal ideas.   Objective  Blood pressure 126/84, pulse 78, temperature 97.8 F (36.6 C), temperature source Temporal, resp. rate 14, height 5' 9.5" (1.765 m), weight 215 lb 8 oz (97.8  kg), SpO2 96 %.  Vitals with BMI 04/20/2019 10/12/2017 10/12/2017  Height 5' 9.5" - -  Weight 215 lbs 8 oz - -  BMI 93.81 - -  Systolic 829 937 -  Diastolic 84 88 -  Pulse 78 86 97     Physical Exam  Constitutional:  Moderately built and mildly obese in no acute distress  Neck: No thyromegaly present.  Cardiovascular: Normal rate, regular rhythm, normal heart sounds and intact distal pulses. Exam reveals no gallop.  No murmur heard. No leg edema, no JVD.  Pulmonary/Chest: Effort normal and breath sounds normal.  Abdominal: Soft. Bowel sounds are normal.  Musculoskeletal:     Cervical back: Neck supple.  Skin: Skin is warm and dry.   Laboratory  examination:   No results for input(s): NA, K, CL, CO2, GLUCOSE, BUN, CREATININE, CALCIUM, GFRNONAA, GFRAA in the last 8760 hours. CrCl cannot be calculated (Patient's most recent lab result is older than the maximum 21 days allowed.).  CMP Latest Ref Rng & Units 10/11/2017 10/09/2017 10/08/2017  Glucose 70 - 99 mg/dL 298(H) 275(H) 278(H)  BUN 6 - 20 mg/dL '11 12 9  ' Creatinine 0.44 - 1.00 mg/dL 0.68 0.63 0.63  Sodium 135 - 145 mmol/L 135 137 136  Potassium 3.5 - 5.1 mmol/L 4.4 4.2 4.3  Chloride 98 - 111 mmol/L 98 105 107  CO2 22 - 32 mmol/L 26 22 18(L)  Calcium 8.9 - 10.3 mg/dL 9.8 9.0 8.9  Total Protein 6.5 - 8.1 g/dL 6.6 5.9(L) -  Total Bilirubin 0.3 - 1.2 mg/dL 1.3(H) 1.1 -  Alkaline Phos 38 - 126 U/L 56 43 -  AST 15 - 41 U/L 35 39 -  ALT 0 - 44 U/L 51(H) 38 -   CBC Latest Ref Rng & Units 10/11/2017 10/09/2017 10/07/2017  WBC 4.0 - 10.5 K/uL 11.1(H) 8.0 -  Hemoglobin 12.0 - 15.0 g/dL 16.2(H) 15.1(H) 14.3  Hematocrit 36.0 - 46.0 % 46.9(H) 42.3 42.0  Platelets 150 - 400 K/uL 262 275 -   Lipid Panel  No results found for: CHOL, TRIG, HDL, CHOLHDL, VLDL, LDLCALC, LDLDIRECT HEMOGLOBIN A1C Lab Results  Component Value Date   HGBA1C 11.4 (H) 10/09/2017   MPG 280.48 10/09/2017   TSH No results for input(s): TSH in the last 8760 hours.  External labs 11/13/2018:   Glucose of 215, triglycerides 2068, HDL 20, LDL not calculated.  Non-HDL cholesterol 195.  Serum glucose 374, BUN 11, creatinine 0.54, EGFR >60 mL.  Sodium 134, potassium 3.9.  A1c 10.0%, TSH normal. Hb 16.1/HCT 48.2, platelets 274.  Vitamin D 13.  Medications and allergies   Allergies  Allergen Reactions  . Fentanyl Palpitations and Shortness Of Breath    Pt states throat swells and heart races  . Penicillins Itching and Swelling    Facial swelling Has patient had a PCN reaction causing immediate rash, facial/tongue/throat swelling, SOB or lightheadedness with hypotension: Yes Has patient had a PCN reaction causing  severe rash involving mucus membranes or skin necrosis: No Has patient had a PCN reaction that required hospitalization Yes Has patient had a PCN reaction occurring within the last 10 years: No If all of the above answers are "NO", then may proceed with Cephalosporin use.   . Triamterene Shortness Of Breath and Other (See Comments)    Blisters on stomach  . Naproxen Itching    SKIN BURNING  . Ultram [Tramadol] Hives     Current Outpatient Medications  Medication Instructions  . 2-3CC SYRINGE 1  Units, Does not apply, 3 times daily  . albuterol (PROVENTIL HFA;VENTOLIN HFA) 108 (90 BASE) MCG/ACT inhaler 2 puffs, Inhalation, Every 6 hours PRN  . albuterol (PROVENTIL) 2.5 mg, Every 8 hours PRN  . ALPRAZolam (XANAX) 1 mg, Oral, 4 times daily, 3 times throughout the day and 1/2 tablet at night  . aspirin EC 81 mg, Oral, Daily  . atenolol (TENORMIN) 50 mg, Oral, Every evening  . atorvastatin (LIPITOR) 80 mg, Oral, Every evening  . blood glucose meter kit and supplies Dispense based on patient and insurance preference. Use up to four times daily as directed. (FOR ICD-10 E10.9, E11.9).  . cycloSPORINE (RESTASIS) 0.05 % ophthalmic emulsion 1 drop, Both Eyes, 2 times daily  . diltiazem (CARDIZEM CD) 120 MG 24 hr capsule 1 capsule, Oral, Every evening  . glucose monitoring kit (FREESTYLE) monitoring kit 1 each, Does not apply, As needed  . insulin aspart protamine- aspart (NOVOLOG MIX 70/30) (70-30) 100 UNIT/ML injection 12 Units, Subcutaneous, 2 times daily with meals  . lamoTRIgine (LAMICTAL) 50 mg, Oral, 3 times daily  . lithium carbonate (LITHOBID) 300 mg, Oral, Every 12 hours  . nicotine (NICODERM CQ) 14 mg, Transdermal, Daily  . nitroGLYCERIN (NITROSTAT) 0.4 mg, Sublingual, Every 5 min PRN  . omeprazole (PRILOSEC) 40 mg, Oral, 3 times daily  . oxyCODONE-acetaminophen (PERCOCET) 10-325 MG tablet 1 tablet, Oral, Every 4 hours PRN    Radiology:   MRI brain 10/08/2017: Normal.  Cardiac  Studies:   None  Assessment     ICD-10-CM   1. Angina pectoris (HCC)  I20.9 EKG 12-Lead    PCV ECHOCARDIOGRAM COMPLETE    PCV MYOCARDIAL PERFUSION WITH LEXISCAN    nitroGLYCERIN (NITROSTAT) 0.4 MG SL tablet  2. Mixed hyperlipidemia  E78.2 Lipid Panel With LDL/HDL Ratio    LDL cholesterol, direct  3. Primary hypertension  I10   4. Dyspnea on exertion  R06.00 PCV ECHOCARDIOGRAM COMPLETE    PCV MYOCARDIAL PERFUSION WITH LEXISCAN  5. Tobacco use disorder  F17.200 nicotine (NICODERM CQ) 14 mg/24hr patch  6. Uncontrolled type 2 diabetes mellitus with hyperglycemia (HCC)  E11.65 Hgb A1c w/o eAG  7. Hypovitaminosis D  E55.9 Vitamin D 1,25 dihydroxy    EKG 04/20/2019: Normal sinus rhythm with rate of 76 bpm, normal axis, no evidence of ischemia, normal EKG.      Meds ordered this encounter  Medications  . nicotine (NICODERM CQ) 14 mg/24hr patch    Sig: Place 1 patch (14 mg total) onto the skin daily.    Dispense:  28 patch    Refill:  1  . nitroGLYCERIN (NITROSTAT) 0.4 MG SL tablet    Sig: Place 1 tablet (0.4 mg total) under the tongue every 5 (five) minutes as needed for chest pain.    Dispense:  25 tablet    Refill:  1    Medications Discontinued During This Encounter  Medication Reason  . clindamycin (CLEOCIN) 300 MG capsule Error  . temazepam (RESTORIL) 30 MG capsule Error     Recommendations:   SALIHAH PECKHAM  is a 34 y.o. 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  Patient with uncontrolled diabetes mellitus, mixed hyperlipidemia, ongoing tobacco use disorder referred to me for evaluation of chest pain, chest pain symptoms are very typical of angina pectoris. Schedule for a [employer]  Sestamibi stress test to evaluate for myocardial ischemia.  Patient unable to do treadmill stress testing due to Covid 19 to reduce aerosol spread.  Will schedule for an echocardiogram.  As she has made lifestyle changes and has lost close to 50 pounds in weight over the past 6 months, we will repeat her lipids along with A1c, will also obtain direct LDL and I like to see her back in 4 weeks for follow-up. S/L NTG was prescribed and explained how to and when to use it and to notify us if there is change in frequency of use. In patient. She appears to be motivated in smoking cessation as well.  I have prescribed her NicoDerm patches.  I encouraged her to establish herself back with psychiatrist, she has discontinued all her medications.  Adrian Prows, MD, [facility] 04/20/2019, 11:25 AM [facility] [place] Office: [phone]

## 2019-04-27 ENCOUNTER — Other Ambulatory Visit: Payer: Medicaid Other

## 2019-05-04 ENCOUNTER — Other Ambulatory Visit: Payer: Medicaid Other

## 2019-05-11 ENCOUNTER — Other Ambulatory Visit: Payer: Medicaid Other

## 2019-05-20 ENCOUNTER — Ambulatory Visit: Payer: Medicaid Other

## 2019-05-20 ENCOUNTER — Other Ambulatory Visit: Payer: Self-pay

## 2019-05-20 DIAGNOSIS — I209 Angina pectoris, unspecified: Secondary | ICD-10-CM

## 2019-05-20 DIAGNOSIS — R0609 Other forms of dyspnea: Secondary | ICD-10-CM

## 2019-05-27 ENCOUNTER — Other Ambulatory Visit: Payer: Self-pay

## 2019-05-27 ENCOUNTER — Ambulatory Visit: Payer: Medicaid Other | Admitting: Cardiology

## 2019-05-27 ENCOUNTER — Encounter: Payer: Self-pay | Admitting: Cardiology

## 2019-05-27 VITALS — BP 113/70 | HR 66 | Temp 97.5°F | Resp 16 | Ht 69.5 in | Wt 217.4 lb

## 2019-05-27 DIAGNOSIS — I209 Angina pectoris, unspecified: Secondary | ICD-10-CM

## 2019-05-27 DIAGNOSIS — E782 Mixed hyperlipidemia: Secondary | ICD-10-CM

## 2019-05-27 DIAGNOSIS — F17200 Nicotine dependence, unspecified, uncomplicated: Secondary | ICD-10-CM

## 2019-05-27 DIAGNOSIS — R0609 Other forms of dyspnea: Secondary | ICD-10-CM

## 2019-05-27 MED ORDER — NICOTINE 14 MG/24HR TD PT24: 14.0000 mg | MEDICATED_PATCH | Freq: Every day | TRANSDERMAL | 1 refills | Status: AC

## 2019-05-27 NOTE — Progress Notes (Signed)
Primary Physician/Referring:  Tamsen Roers, MD  Patient ID: Rebecca Moyer, female    DOB: 07-12-1985, 34 y.o.   MRN: [id number]  Chief Complaint  Patient presents with  . Follow-up    4 week for Angina  . Results    Labs, Stress, Echocardiogram   HPI:    Rebecca Moyer  is a 34 y.o. 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  For the past few months she has noticed tightness in the middle of the chest described as heaviness to tightness that comes on with activity and is relieved with rest.  Associated shortness of breath.  She has per about 5 minutes with relief of chest discomfort. She has not used s/l NTG and has reduced tobacco use significantly. She is trying her best to quit smoking and loose weight.  She admits to not being on any of psychiatric medications for the past year. She is now wearing nicoderm patch.   Past Medical History:  Diagnosis Date  . Anxiety   . Arthritis    knees  . Asthma 06/07/11  . Asthma    prn inhaler  . Bipolar disorder ([place])   . Depression 06/07/11  . Depression   . Diabetes mellitus without complication ([place])   . Difficulty swallowing solids    due to GERD and slow motility of esophagus, per pt.  Marland Kitchen GERD (gastroesophageal reflux disease)   . H/O amenorrhea 01/27/04  . H/O varicella   . Hidradenitis 09/2014   bilateral axilla, left buttock, left groin  . Hypertension 06/07/11  . Hypertension    states under control with med., has been on med. x 6 yr.  . Irregular periods/menstrual cycles 05/22/04  . Multiple personality disorder ([place])   . Nasal congestion 09/27/2014  . Non-insulin dependent type 2 diabetes mellitus ([place])   . Obesity 06/07/11  . PCOS (polycystic ovarian syndrome) 06/07/11  . Schizophrenia ([place])   . Sleep  apnea    does not use CPAP every night, per pt.  . Smoker 06/07/11  . Tachycardia    no cardiologist, per pt.   Past Surgical History:  Procedure Laterality Date  . adnoids  06/07/11  . HYDRADENITIS EXCISION Bilateral 09/28/2014   Procedure: EXCISION HIDRADENITIS BILATERAL AXILLARY;  Surgeon: Armandina Gemma, MD;  Location: [facility];  Service: General;  Laterality: Bilateral;  . TONSILLECTOMY  06/07/11  . TONSILLECTOMY AND ADENOIDECTOMY  06/26/2000  . WISDOM TOOTH EXTRACTION  06/07/11  . WISDOM TOOTH EXTRACTION     Social History   Tobacco Use  . Smoking status: Current Every Day Smoker    Packs/day: 0.50    Years: 8.00    Pack years: 4.00    Types: Cigarettes  . Smokeless tobacco: Never Used  Substance Use Topics  . Alcohol use: No    ROS  Review of Systems  Cardiovascular: Positive for chest pain and dyspnea on exertion. Negative for leg swelling.  Gastrointestinal: Negative for melena.  Psychiatric/Behavioral: Negative for substance abuse and suicidal ideas.   Objective  Blood pressure 113/70, pulse 66, temperature (!) 97.5 F (36.4 C), temperature source Temporal, resp. rate 16, height 5' 9.5" (1.765 m), weight 217 lb 6.4 oz (98.6 kg), SpO2 95 %.  Vitals with BMI 05/27/2019 04/20/2019 10/12/2017  Height 5' 9.5" 5' 9.5" -  Weight 217 lbs 6 oz  215 lbs 8 oz -  BMI 21.19 41.74 -  Systolic 081 448 185  Diastolic 70 84 88  Pulse 66 78 86     Physical Exam  Constitutional:  Moderately built and mildly obese in no acute distress  Neck: No thyromegaly present.  Cardiovascular: Normal rate, regular rhythm, normal heart sounds and intact distal pulses. Exam reveals no gallop.  No murmur heard. No leg edema, no JVD.  Pulmonary/Chest: Effort normal and breath sounds normal.  Abdominal: Soft. Bowel sounds are normal.  Musculoskeletal:     Cervical back: Neck supple.  Skin: Skin is warm and dry.   Laboratory examination:   No results for input(s): NA, K, CL, CO2,  GLUCOSE, BUN, CREATININE, CALCIUM, GFRNONAA, GFRAA in the last 8760 hours. CrCl cannot be calculated (Patient's most recent lab result is older than the maximum 21 days allowed.).  CMP Latest Ref Rng & Units 10/11/2017 10/09/2017 10/08/2017  Glucose 70 - 99 mg/dL 298(H) 275(H) 278(H)  BUN 6 - 20 mg/dL _0 Creatinine 0.44 - 1.00 mg/dL 0.68 0.63 0.63  Sodium 135 - 145 mmol/L 135 137 136  Potassium 3.5 - 5.1 mmol/L 4.4 4.2 4.3  Chloride 98 - 111 mmol/L 98 105 107  CO2 22 - 32 mmol/L 26 22 18(L)  Calcium 8.9 - 10.3 mg/dL 9.8 9.0 8.9  Total Protein 6.5 - 8.1 g/dL 6.6 5.9(L) -  Total Bilirubin 0.3 - 1.2 mg/dL 1.3(H) 1.1 -  Alkaline Phos 38 - 126 U/L 56 43 -  AST 15 - 41 U/L 35 39 -  ALT 0 - 44 U/L 51(H) 38 -   CBC Latest Ref Rng & Units 10/11/2017 10/09/2017 10/07/2017  WBC 4.0 - 10.5 K/uL 11.1(H) 8.0 -  Hemoglobin 12.0 - 15.0 g/dL 16.2(H) 15.1(H) 14.3  Hematocrit 36.0 - 46.0 % 46.9(H) 42.3 42.0  Platelets 150 - 400 K/uL 262 275 -   Lipid Panel  No results found for: CHOL, TRIG, HDL, CHOLHDL, VLDL, LDLCALC, LDLDIRECT HEMOGLOBIN A1C Lab Results  Component Value Date   HGBA1C 11.4 (H) 10/09/2017   MPG 280.48 10/09/2017   TSH No results for input(s): TSH in the last 8760 hours.  External labs 11/13/2018:   Glucose of 215, triglycerides 2068, HDL 20, LDL not calculated.  Non-HDL cholesterol 195.  Serum glucose 374, BUN 11, creatinine 0.54, EGFR >60 mL.  Sodium 134, potassium 3.9.  A1c 10.0%, TSH normal. Hb 16.1/HCT 48.2, platelets 274.  Vitamin D 13.  Medications and allergies   Allergies  Allergen Reactions  . Fentanyl Palpitations and Shortness Of Breath    Pt states throat swells and heart races  . Penicillins Itching and Swelling    Facial swelling Has patient had a PCN reaction causing immediate rash, facial/tongue/throat swelling, SOB or lightheadedness with hypotension: Yes Has patient had a PCN reaction causing severe rash involving mucus membranes or skin necrosis:  No Has patient had a PCN reaction that required hospitalization Yes Has patient had a PCN reaction occurring within the last 10 years: No If all of the above answers are "NO", then may proceed with Cephalosporin use.   . Triamterene Shortness Of Breath and Other (See Comments)    Blisters on stomach  . Naproxen Itching    SKIN BURNING  . Ultram [Tramadol] Hives     Current Outpatient Medications  Medication Instructions  . 2-3CC SYRINGE 1 Units, Does not apply, 3 times daily  . albuterol (PROVENTIL HFA;VENTOLIN HFA) 108 (90 BASE) MCG/ACT inhaler 2 puffs,  Inhalation, Every 6 hours PRN  . albuterol (PROVENTIL) 2.5 mg, Every 8 hours PRN  . ALPRAZolam (XANAX) 1 mg, Oral, 4 times daily, 3 times throughout the day and 1/2 tablet at night  . aspirin EC 81 mg, Oral, Daily  . atenolol (TENORMIN) 50 mg, Oral, Every evening  . atorvastatin (LIPITOR) 80 mg, Oral, Every evening  . blood glucose meter kit and supplies Dispense based on patient and insurance preference. Use up to four times daily as directed. (FOR ICD-10 E10.9, E11.9).  . cycloSPORINE (RESTASIS) 0.05 % ophthalmic emulsion 1 drop, Both Eyes, 2 times daily  . diltiazem (CARDIZEM CD) 120 MG 24 hr capsule 1 capsule, Oral, Every evening  . gemfibrozil (LOPID) 600 mg, Oral, 2 times daily before meals  . glucose monitoring kit (FREESTYLE) monitoring kit 1 each, Does not apply, As needed  . insulin aspart protamine- aspart (NOVOLOG MIX 70/30) (70-30) 100 UNIT/ML injection 12 Units, Subcutaneous, 2 times daily with meals  . lamoTRIgine (LAMICTAL) 50 mg, Oral, 3 times daily  . nicotine (NICODERM CQ) 14 mg, Transdermal, Daily  . nitroGLYCERIN (NITROSTAT) 0.4 mg, Sublingual, Every 5 min PRN  . omeprazole (PRILOSEC) 40 mg, Oral, 3 times daily  . oxyCODONE-acetaminophen (PERCOCET) 10-325 MG tablet 1 tablet, Oral, Every 4 hours PRN    Radiology:   MRI brain 10/08/2017: Normal.  Cardiac Studies:   Echocardiogram 05/20/2019:  Left ventricle  cavity is normal in size. Mild concentric hypertrophy of  the left ventricle. Normal global wall motion. Normal LV systolic function  with EF 67%. Normal diastolic filling pattern.  No significant valvular abnormality.  Normal right atrial pressure.   Lexiscan (Walking with mod Bruce)Tetrofosmin Stress Test 05/20/2019: Nondiagnostic ECG stress. Myocardial perfusion is normal. Stress LV EF: 73%.  No previous exam available for comparison. LOW RISK STUDY.    EKG:  EKG 04/20/2019: Normal sinus rhythm with rate of 76 bpm, normal axis, no evidence of ischemia, normal EKG.      Assessment     ICD-10-CM   1. Angina pectoris (HCC)  I20.9   2. Mixed hyperlipidemia  E78.2   3. Dyspnea on exertion  R06.00   4. Tobacco use disorder  F17.200 nicotine (NICODERM CQ) 14 mg/24hr patch    Meds ordered this encounter  Medications  . nicotine (NICODERM CQ) 14 mg/24hr patch    Sig: Place 1 patch (14 mg total) onto the skin daily.    Dispense:  28 patch    Refill:  1    Medications Discontinued During This Encounter  Medication Reason  . lithium carbonate (LITHOBID) 300 MG CR tablet Patient Preference  . nicotine (NICODERM CQ) 14 mg/24hr patch Reorder     Recommendations:   Rebecca Moyer  is a 34 y.o. 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  Advised her to continue present medications and as she is motivated in making lifestyle, expect TG to improve with improvement in DM control. She appears to be motivated in smoking cessation as well.  I have refilled her NicoDerm patches.  I encouraged her to establish herself back with psychiatrist, she has discontinued all her medications.  BP is controlled and she is on appropriate medical  therapy. I encouraged her to get the labs including direct LDL now that she is on active therapy with high  dose Lipitor and gemfibrozil. TG are in excess of 2000 and A1c 10%. Her dyspnea is related to tobacco use, deconditioning and obesity.   Her husband is present who also smokes, discussed combination quitting cigarettes would be most successful. I would like to see her back in 3 months to improve compliance and f/u labs.   Adrian Prows, MD, [facility] 05/28/2019, 9:08 PM [facility]. [place] Office: [phone]

## 2019-05-28 NOTE — Progress Notes (Signed)
Labs 11/13/2018: Total cholesterol 215, triglycerides 2068, HDL 20.  Non-HDL cholesterol 195.  LDL not calculated.  Serum glucose 224 mg, BUN 11, creatinine 0.54, EGFR 127 mL.  Sodium 134, potassium 3.9.  A1c 10.0%. Scratch that TSH normal. Hb 16.1/HCT 48.2, platelet count 274. Vitamin D 13

## 2019-05-31 NOTE — Progress Notes (Signed)
Please remind her that I needs lab that were ordered by me. They are pending at [facility]. Also, she needs to see me in 3 months not 6.  I tried calling her several times, goes to VM that is not set.  JG

## 2019-06-15 ENCOUNTER — Ambulatory Visit: Payer: Medicaid Other | Admitting: Cardiology

## 2019-10-06 ENCOUNTER — Ambulatory Visit: Payer: Medicaid Other | Admitting: Obstetrics and Gynecology

## 2019-11-02 ENCOUNTER — Ambulatory Visit: Payer: Medicaid Other | Admitting: Obstetrics and Gynecology

## 2019-11-27 ENCOUNTER — Ambulatory Visit: Payer: Medicaid Other | Admitting: Cardiology

## 2021-09-26 ENCOUNTER — Other Ambulatory Visit (HOSPITAL_COMMUNITY): Payer: Self-pay

## 2021-09-26 MED ORDER — NALOXONE HCL 4 MG/0.1ML NA LIQD: NASAL | 11 refills | Status: AC

## 2021-09-26 MED ORDER — TIZANIDINE HCL 4 MG PO TABS
4.0000 mg | ORAL_TABLET | Freq: Three times a day (TID) | ORAL | 0 refills | Status: AC | PRN
  Filled 2021-09-26: qty 45, 15d supply, fill #0

## 2021-09-26 MED ORDER — OXYCODONE HCL 15 MG PO TABS
ORAL_TABLET | ORAL | 0 refills | Status: DC
  Filled 2021-09-26: qty 75, 15d supply, fill #0

## 2021-09-26 MED ORDER — GABAPENTIN 600 MG PO TABS
600.0000 mg | ORAL_TABLET | Freq: Four times a day (QID) | ORAL | 0 refills | Status: AC
  Filled 2021-09-26: qty 60, 15d supply, fill #0

## 2021-10-06 ENCOUNTER — Other Ambulatory Visit (HOSPITAL_BASED_OUTPATIENT_CLINIC_OR_DEPARTMENT_OTHER): Payer: Self-pay

## 2021-10-10 ENCOUNTER — Other Ambulatory Visit (HOSPITAL_COMMUNITY): Payer: Self-pay

## 2021-10-10 MED ORDER — GABAPENTIN 800 MG PO TABS
ORAL_TABLET | ORAL | 0 refills | Status: AC
  Filled 2021-10-10: qty 60, 15d supply, fill #0

## 2021-10-10 MED ORDER — TIZANIDINE HCL 4 MG PO TABS
ORAL_TABLET | ORAL | 0 refills | Status: AC
  Filled 2021-10-10: qty 60, 15d supply, fill #0

## 2021-10-10 MED ORDER — OXYCODONE HCL 15 MG PO TABS
ORAL_TABLET | ORAL | 0 refills | Status: AC
  Filled 2021-10-10: qty 75, 15d supply, fill #0

## 2021-10-23 ENCOUNTER — Other Ambulatory Visit (HOSPITAL_COMMUNITY): Payer: Self-pay

## 2022-02-22 ENCOUNTER — Other Ambulatory Visit (HOSPITAL_COMMUNITY): Payer: Self-pay

## 2022-02-23 ENCOUNTER — Other Ambulatory Visit (HOSPITAL_COMMUNITY): Payer: Self-pay

## 2022-02-23 MED ORDER — OXYCODONE HCL 15 MG PO TABS
15.0000 mg | ORAL_TABLET | Freq: Every day | ORAL | 0 refills | Status: AC | PRN
Start: 2022-02-23 — End: ?
  Filled 2022-02-23: qty 150, 30d supply, fill #0

## 2022-02-26 ENCOUNTER — Other Ambulatory Visit (HOSPITAL_COMMUNITY): Payer: Self-pay

## 2022-05-22 ENCOUNTER — Other Ambulatory Visit (HOSPITAL_COMMUNITY): Payer: Self-pay

## 2022-05-22 MED ORDER — GABAPENTIN 800 MG PO TABS
800.0000 mg | ORAL_TABLET | Freq: Four times a day (QID) | ORAL | 0 refills | Status: AC
Start: 2022-05-22 — End: ?
  Filled 2022-05-22: qty 60, 15d supply, fill #0

## 2022-05-22 MED ORDER — OXYCODONE-ACETAMINOPHEN 10-325 MG PO TABS
1.0000 | ORAL_TABLET | Freq: Every day | ORAL | 0 refills | Status: AC | PRN
Start: 2022-05-22 — End: ?
  Filled 2022-05-22: qty 75, 16d supply, fill #0

## 2022-05-22 MED ORDER — TIZANIDINE HCL 4 MG PO TABS
4.0000 mg | ORAL_TABLET | Freq: Four times a day (QID) | ORAL | 0 refills | Status: AC | PRN
Start: 2022-05-22 — End: ?
  Filled 2022-05-22: qty 60, 15d supply, fill #0

## 2022-05-23 ENCOUNTER — Other Ambulatory Visit (HOSPITAL_COMMUNITY): Payer: Self-pay

## 2022-06-05 ENCOUNTER — Other Ambulatory Visit (HOSPITAL_COMMUNITY): Payer: Self-pay

## 2022-06-05 MED ORDER — OXYCODONE-ACETAMINOPHEN 10-325 MG PO TABS
ORAL_TABLET | ORAL | 0 refills | Status: AC
Start: 2022-06-05 — End: ?
  Filled 2022-06-05: qty 75, 15d supply, fill #0

## 2022-06-05 MED ORDER — TIZANIDINE HCL 4 MG PO TABS
ORAL_TABLET | ORAL | 0 refills | Status: AC
  Filled 2022-06-05: qty 60, 15d supply, fill #0

## 2022-06-05 MED ORDER — GABAPENTIN 800 MG PO TABS
ORAL_TABLET | ORAL | 0 refills | Status: AC
  Filled 2022-06-05: qty 60, 15d supply, fill #0

## 2022-06-06 ENCOUNTER — Other Ambulatory Visit (HOSPITAL_COMMUNITY): Payer: Self-pay

## 2022-06-14 ENCOUNTER — Other Ambulatory Visit (HOSPITAL_COMMUNITY): Payer: Self-pay

## 2022-08-21 ENCOUNTER — Emergency Department (HOSPITAL_COMMUNITY)
Admission: EM | Admit: 2022-08-21 | Discharge: 2022-08-22 | Disposition: A | Discharge status: Home / Self Care | Payer: Medicaid Other | Attending: Emergency Medicine | Admitting: Emergency Medicine

## 2022-08-21 ENCOUNTER — Other Ambulatory Visit: Payer: Self-pay

## 2022-08-21 DIAGNOSIS — R569 Unspecified convulsions: Secondary | ICD-10-CM | POA: Diagnosis present

## 2022-08-21 DIAGNOSIS — E871 Hypo-osmolality and hyponatremia: Secondary | ICD-10-CM | POA: Diagnosis not present

## 2022-08-21 DIAGNOSIS — R079 Chest pain, unspecified: Secondary | ICD-10-CM | POA: Diagnosis not present

## 2022-08-21 DIAGNOSIS — Z7982 Long term (current) use of aspirin: Secondary | ICD-10-CM | POA: Diagnosis not present

## 2022-08-21 DIAGNOSIS — Z794 Long term (current) use of insulin: Secondary | ICD-10-CM | POA: Insufficient documentation

## 2022-08-21 LAB — COMPREHENSIVE METABOLIC PANEL
ALT: 31 U/L (ref 0–44)
AST: 23 U/L (ref 15–41)
Albumin: 3.3 g/dL — ABNORMAL LOW (ref 3.5–5.0)
Alkaline Phosphatase: 63 U/L (ref 38–126)
Anion gap: 12 (ref 5–15)
BUN: 7 mg/dL (ref 6–20)
CO2: 17 mmol/L — ABNORMAL LOW (ref 22–32)
Calcium: 8.6 mg/dL — ABNORMAL LOW (ref 8.9–10.3)
Chloride: 104 mmol/L (ref 98–111)
Creatinine, Ser: 0.53 mg/dL (ref 0.44–1.00)
GFR, Estimated: >60 mL/min (ref >60)
Glucose, Bld: 348 mg/dL — ABNORMAL HIGH (ref 70–99)
Potassium: 4 mmol/L (ref 3.5–5.1)
Sodium: 133 mmol/L — ABNORMAL LOW (ref 135–145)
Total Bilirubin: 0.6 mg/dL (ref 0.3–1.2)
Total Protein: 5.8 g/dL — ABNORMAL LOW (ref 6.5–8.1)

## 2022-08-21 LAB — URINALYSIS, W/ REFLEX TO CULTURE (INFECTION SUSPECTED)
Bilirubin Urine: NEGATIVE
Glucose, UA: >=500 mg/dL — AB
Hgb urine dipstick: NEGATIVE
Ketones, ur: NEGATIVE mg/dL
Nitrite: NEGATIVE
Protein, ur: NEGATIVE mg/dL
Specific Gravity, Urine: 1.032 — ABNORMAL HIGH (ref 1.005–1.030)
pH: 5 (ref 5.0–8.0)

## 2022-08-21 LAB — CBC WITH DIFFERENTIAL/PLATELET
Abs Immature Granulocytes: 0.02 10*3/uL (ref 0.00–0.07)
Basophils Absolute: 0.1 10*3/uL (ref 0.0–0.1)
Basophils Relative: 1 %
Eosinophils Absolute: 0.2 10*3/uL (ref 0.0–0.5)
Eosinophils Relative: 4 %
HCT: 41.8 % (ref 36.0–46.0)
Hemoglobin: 13.9 g/dL (ref 12.0–15.0)
Immature Granulocytes: 0 %
Lymphocytes Relative: 46 %
Lymphs Abs: 3.2 10*3/uL (ref 0.7–4.0)
MCH: 30.4 pg (ref 26.0–34.0)
MCHC: 33.3 g/dL (ref 30.0–36.0)
MCV: 91.5 fL (ref 80.0–100.0)
Monocytes Absolute: 0.4 10*3/uL (ref 0.1–1.0)
Monocytes Relative: 6 %
Neutro Abs: 2.9 10*3/uL (ref 1.7–7.7)
Neutrophils Relative %: 43 %
Platelets: 18 10*3/uL — CL (ref 150–400)
RBC: 4.57 MIL/uL (ref 3.87–5.11)
RDW: 12.2 % (ref 11.5–15.5)
WBC: 6.8 10*3/uL (ref 4.0–10.5)
nRBC: 0 % (ref 0.0–0.2)

## 2022-08-21 LAB — TROPONIN I (HIGH SENSITIVITY)
Troponin I (High Sensitivity): 4 ng/L (ref <18)
Troponin I (High Sensitivity): <2 ng/L (ref <18)

## 2022-08-21 LAB — I-STAT BETA HCG BLOOD, ED (MC, WL, AP ONLY): I-stat hCG, quantitative: <5 m[IU]/mL (ref <5)

## 2022-08-21 LAB — PROTIME-INR
INR: 1 (ref 0.8–1.2)
Prothrombin Time: 13.7 seconds (ref 11.4–15.2)

## 2022-08-21 LAB — CBG MONITORING, ED: Glucose-Capillary: 339 mg/dL — ABNORMAL HIGH (ref 70–99)

## 2022-08-21 LAB — APTT: aPTT: 28 seconds (ref 24–36)

## 2022-08-21 NOTE — ED Triage Notes (Signed)
Pt arrives to ED c/o several episodes of seizure like activity. Pt is currently on Keppra and has a hx of this. Pt reported to have seizure like activity in lobby. A/O at this moment

## 2022-08-21 NOTE — ED Notes (Signed)
Pt CBG [phone] 

## 2022-08-21 NOTE — ED Provider Notes (Signed)
[place] EMERGENCY DEPARTMENT AT [facility] Provider Note   CSN: [id number] Arrival date & time: 08/21/22  1941     History {Add pertinent medical, surgical, social history, OB history to HPI:1} No chief complaint on file.   Rebecca Moyer is a 37 y.o. female. Has been on Keppra since inpatient admission for seizures in 2019. States she has been having seizures intermittently since then. Came in today because seizures have been constant and she has been feeling "weird". Left [facility] AMA yesterday due to "missing purse and rude staff". Also complains of intermittent chest pain that she thought was due to her anxiety. Also stating that her stomach feels mildly tender and reports n/v/d x5 days. Also states that she is retaining fluid and "not urinating much".  Denies fever, dyspnea, cough.  HPI     Home Medications Prior to Admission medications   Medication Sig Start Date End Date Taking? Authorizing Provider  albuterol (PROVENTIL HFA;VENTOLIN HFA) 108 (90 BASE) MCG/ACT inhaler Inhale 2 puffs into the lungs every 6 (six) hours as needed for wheezing or shortness of breath. 01/01/14   Derwood Kaplan, MD  albuterol (PROVENTIL) (2.5 MG/3ML) 0.083% nebulizer solution Take 2.5 mg by nebulization every 8 (eight) hours as needed. Shortness of breath    [provider]  ALPRAZolam (XANAX) 1 MG tablet Take 1 mg by mouth 4 (four) times daily. 3 times throughout the day and 1/2 tablet at night    [provider]  aspirin EC 81 MG tablet Take 81 mg by mouth daily.    [provider]  atenolol (TENORMIN) 50 MG tablet Take 50 mg by mouth every evening.  01/20/14   [provider]  atorvastatin (LIPITOR) 80 MG tablet Take 80 mg by mouth every evening.     [provider]  blood glucose meter kit and supplies Dispense based on patient and insurance preference. Use up to four times daily as directed. (FOR ICD-10 E10.9, E11.9). 10/10/17   Arrien,  York Ram, MD  cycloSPORINE (RESTASIS) 0.05 % ophthalmic emulsion Place 1 drop into both eyes 2 (two) times daily.    [provider]  diltiazem (CARDIZEM CD) 120 MG 24 hr capsule Take 1 capsule by mouth every evening.  03/13/16   [provider]  gabapentin (NEURONTIN) 600 MG tablet Take 1 tablet (600 mg total) by mouth 4 (four) times daily for neuropathy 09/26/21     gabapentin (NEURONTIN) 800 MG tablet Take 1 tablet by mouth 4 times a day for neuropathy 10/10/21     gabapentin (NEURONTIN) 800 MG tablet Take 1 tablet (800 mg total) by mouth 4 (four) times daily for neuropathy. 05/22/22     gabapentin (NEURONTIN) 800 MG tablet Take 1 tablet by mouth 4 times daily for neuropathy 06/05/22     gemfibrozil (LOPID) 600 MG tablet Take 600 mg by mouth 2 (two) times daily before a meal.    [provider]  glucose monitoring kit (FREESTYLE) monitoring kit 1 each by Does not apply route as needed for other. 10/20/15   Rancour, Jeannett Senior, MD  insulin aspart protamine- aspart (NOVOLOG MIX 70/30) (70-30) 100 UNIT/ML injection Inject 0.12 mLs (12 Units total) into the skin 2 (two) times daily with a meal. 10/10/17   Arrien, York Ram, MD  lamoTRIgine (LAMICTAL) 25 MG tablet Take 50 mg by mouth 3 (three) times daily.    [provider]  naloxone [facility]) nasal spray 4 mg/0.1 mL Use 1 spray as needed 09/26/21  nicotine (NICODERM CQ) 14 mg/24hr patch Place 1 patch (14 mg total) onto the skin daily. 05/27/19   Yates Decamp, MD  nitroGLYCERIN (NITROSTAT) 0.4 MG SL tablet Place 1 tablet (0.4 mg total) under the tongue every 5 (five) minutes as needed for chest pain. 04/20/19 07/19/19  Yates Decamp, MD  omeprazole (PRILOSEC) 40 MG capsule Take 40 mg by mouth 3 (three) times daily.     [provider]  oxyCODONE (ROXICODONE) 15 MG immediate release tablet Take 1 tablet by mouth 5 times a day as needed for pain 10/10/21     oxyCODONE (ROXICODONE) 15 MG immediate release tablet Take 1  tablet (15 mg total) by mouth 5 (five) times daily as needed for pain 02/23/22     oxyCODONE-acetaminophen (PERCOCET) 10-325 MG tablet Take 1 tablet by mouth every 4 (four) hours as needed for pain.  10/07/15   [provider]  oxyCODONE-acetaminophen (PERCOCET) 10-325 MG tablet Take 1 tablet by mouth 5 (five) times daily as needed for pain. 05/22/22     oxyCODONE-acetaminophen (PERCOCET) 10-325 MG tablet Take 1 tablet by mouth 5 times daily as needed for pain 06/05/22     Syringe, Disposable, (2-3CC SYRINGE) 3 ML MISC 1 Units by Does not apply route 3 (three) times daily. 10/10/17   Arrien, York Ram, MD  tiZANidine (ZANAFLEX) 4 MG tablet Take 1 tablet (4 mg total) by mouth 3 (three) times daily as needed for muscle spasms 09/26/21     tiZANidine (ZANAFLEX) 4 MG tablet Take 1 tablet  by mouth four times a day as needed for muscle spasm 10/10/21     tiZANidine (ZANAFLEX) 4 MG tablet Take 1 tablet (4 mg total) by mouth 4 (four) times daily as needed for muscle spasm. 05/22/22     tiZANidine (ZANAFLEX) 4 MG tablet Take 1 tablet 4 times daily as needed for muscle spasm 06/05/22         Allergies    Fentanyl, Penicillins, Triamterene, Naproxen, and Ultram [tramadol]    Review of Systems   Review of Systems  Physical Exam Updated Vital Signs BP 129/81 (BP Location: Right Arm)   Pulse 83   Temp 98 F (36.7 C) (Oral)   Resp 16   Ht 5\' 9"  (1.753 m)   Wt 97.5 kg   SpO2 94%   BMI 31.75 kg/m  Physical Exam Vitals and nursing note reviewed.  Constitutional:      General: She is not in acute distress. HENT:     Head: Normocephalic and atraumatic.     Mouth/Throat:     Mouth: Mucous membranes are moist.     Pharynx: No oropharyngeal exudate or posterior oropharyngeal erythema.  Eyes:     General: No scleral icterus.       Right eye: No discharge.        Left eye: No discharge.     Conjunctiva/sclera: Conjunctivae normal.  Cardiovascular:     Rate and Rhythm: Normal rate and regular  rhythm.     Pulses: Normal pulses.     Heart sounds: Normal heart sounds. No murmur heard.    Comments: No pitting edema. +2 radial and pedal pulses. Pulmonary:     Effort: Pulmonary effort is normal. No respiratory distress.     Breath sounds: Normal breath sounds. No wheezing, rhonchi or rales.  Abdominal:     General: Abdomen is flat. Bowel sounds are normal. There is no distension.     Palpations: Abdomen is soft. There is no mass.  Musculoskeletal:     Right lower leg: No edema.     Left lower leg: No edema.  Skin:    General: Skin is warm and dry.     Findings: No rash.  Neurological:     General: No focal deficit present.     Mental Status: She is alert. Mental status is at baseline.     Comments: GCS 15. Speech is goal oriented. No deficits appreciated to CN III-XII; symmetric eyebrow raise, no facial drooping, tongue midline. Patient has equal grip strength bilaterally with 5/5 strength against resistance in all major muscle groups bilaterally. Patient moves extremities without ataxia. Normal finger-nose-finger.    Psychiatric:        Mood and Affect: Mood normal.     ED Results / Procedures / Treatments   Labs (all labs ordered are listed, but only abnormal results are displayed) Labs Reviewed  COMPREHENSIVE METABOLIC PANEL  CBC WITH DIFFERENTIAL/PLATELET  CBG MONITORING, ED  I-STAT BETA HCG BLOOD, ED ([facility], [facility], [facility] ONLY)    EKG None  Radiology No results found.  Procedures Procedures  {Document cardiac monitor, telemetry assessment procedure when appropriate:1}  Medications Ordered in ED Medications - No data to display  ED Course/ Medical Decision Making/ A&P   {   Click here for ABCD2, HEART and other calculatorsREFRESH Note before signing :1}                          Medical Decision Making Amount and/or Complexity of Data Reviewed Labs: ordered.   ***  {Document critical care time when appropriate:1} {Document review of labs and clinical  decision tools ie heart score, Chads2Vasc2 etc:1}  {Document your independent review of radiology images, and any outside records:1} {Document your discussion with family members, caretakers, and with consultants:1} {Document social determinants of health affecting pt's care:1} {Document your decision making why or why not admission, treatments were needed:1} Final Clinical Impression(s) / ED Diagnoses Final diagnoses:  None    Rx / DC Orders ED Discharge Orders     None

## 2022-08-21 NOTE — ED Provider Notes (Incomplete)
EMERGENCY DEPARTMENT AT [facility] Provider Note   CSN: [id number] Arrival date & time: 08/21/22  1941     History {Add pertinent medical, surgical, social history, OB history to HPI:1} No chief complaint on file.   Rebecca Moyer is a 37 y.o. female. Has been on Keppra since inpatient admission for seizures in 2019. States she has been having seizures intermittently since then. Came in today because seizures have been constant and she has been feeling "weird". Left [facility] AMA yesterday due to "missing purse and rude staff". Also complains of intermittent chest pain that she thought was due to her anxiety. Also stating that her stomach feels mildly tender and reports n/v/d x5 days. Also states that she is retaining fluid and "not urinating much".  Denies fever, dyspnea, cough.  HPI     Home Medications Prior to Admission medications   Medication Sig Start Date End Date Taking? Authorizing Provider  albuterol (PROVENTIL HFA;VENTOLIN HFA) 108 (90 BASE) MCG/ACT inhaler Inhale 2 puffs into the lungs every 6 (six) hours as needed for wheezing or shortness of breath. 01/01/14   Derwood Kaplan, MD  albuterol (PROVENTIL) (2.5 MG/3ML) 0.083% nebulizer solution Take 2.5 mg by nebulization every 8 (eight) hours as needed. Shortness of breath    [provider]  ALPRAZolam (XANAX) 1 MG tablet Take 1 mg by mouth 4 (four) times daily. 3 times throughout the day and 1/2 tablet at night    [provider]  aspirin EC 81 MG tablet Take 81 mg by mouth daily.    [provider]  atenolol (TENORMIN) 50 MG tablet Take 50 mg by mouth every evening.  01/20/14   [provider]  atorvastatin (LIPITOR) 80 MG tablet Take 80 mg by mouth every evening.     [provider]  blood glucose meter kit and supplies Dispense based on patient and insurance preference. Use up to four times daily as directed. (FOR ICD-10 E10.9, E11.9). 10/10/17   Arrien,  York Ram, MD  cycloSPORINE (RESTASIS) 0.05 % ophthalmic emulsion Place 1 drop into both eyes 2 (two) times daily.    [provider]  diltiazem (CARDIZEM CD) 120 MG 24 hr capsule Take 1 capsule by mouth every evening.  03/13/16   [provider]  gabapentin (NEURONTIN) 600 MG tablet Take 1 tablet (600 mg total) by mouth 4 (four) times daily for neuropathy 09/26/21     gabapentin (NEURONTIN) 800 MG tablet Take 1 tablet by mouth 4 times a day for neuropathy 10/10/21     gabapentin (NEURONTIN) 800 MG tablet Take 1 tablet (800 mg total) by mouth 4 (four) times daily for neuropathy. 05/22/22     gabapentin (NEURONTIN) 800 MG tablet Take 1 tablet by mouth 4 times daily for neuropathy 06/05/22     gemfibrozil (LOPID) 600 MG tablet Take 600 mg by mouth 2 (two) times daily before a meal.    [provider]  glucose monitoring kit (FREESTYLE) monitoring kit 1 each by Does not apply route as needed for other. 10/20/15   Rancour, Jeannett Senior, MD  insulin aspart protamine- aspart (NOVOLOG MIX 70/30) (70-30) 100 UNIT/ML injection Inject 0.12 mLs (12 Units total) into the skin 2 (two) times daily with a meal. 10/10/17   Arrien, York Ram, MD  lamoTRIgine (LAMICTAL) 25 MG tablet Take 50 mg by mouth 3 (three) times daily.    [provider]  naloxone [facility]) nasal spray 4 mg/0.1 mL Use 1 spray as needed 09/26/21  nicotine (NICODERM CQ) 14 mg/24hr patch Place 1 patch (14 mg total) onto the skin daily. 05/27/19   Yates Decamp, MD  nitroGLYCERIN (NITROSTAT) 0.4 MG SL tablet Place 1 tablet (0.4 mg total) under the tongue every 5 (five) minutes as needed for chest pain. 04/20/19 07/19/19  Yates Decamp, MD  omeprazole (PRILOSEC) 40 MG capsule Take 40 mg by mouth 3 (three) times daily.     [provider]  oxyCODONE (ROXICODONE) 15 MG immediate release tablet Take 1 tablet by mouth 5 times a day as needed for pain 10/10/21     oxyCODONE (ROXICODONE) 15 MG immediate release tablet Take 1  tablet (15 mg total) by mouth 5 (five) times daily as needed for pain 02/23/22     oxyCODONE-acetaminophen (PERCOCET) 10-325 MG tablet Take 1 tablet by mouth every 4 (four) hours as needed for pain.  10/07/15   [provider]  oxyCODONE-acetaminophen (PERCOCET) 10-325 MG tablet Take 1 tablet by mouth 5 (five) times daily as needed for pain. 05/22/22     oxyCODONE-acetaminophen (PERCOCET) 10-325 MG tablet Take 1 tablet by mouth 5 times daily as needed for pain 06/05/22     Syringe, Disposable, (2-3CC SYRINGE) 3 ML MISC 1 Units by Does not apply route 3 (three) times daily. 10/10/17   Arrien, York Ram, MD  tiZANidine (ZANAFLEX) 4 MG tablet Take 1 tablet (4 mg total) by mouth 3 (three) times daily as needed for muscle spasms 09/26/21     tiZANidine (ZANAFLEX) 4 MG tablet Take 1 tablet  by mouth four times a day as needed for muscle spasm 10/10/21     tiZANidine (ZANAFLEX) 4 MG tablet Take 1 tablet (4 mg total) by mouth 4 (four) times daily as needed for muscle spasm. 05/22/22     tiZANidine (ZANAFLEX) 4 MG tablet Take 1 tablet 4 times daily as needed for muscle spasm 06/05/22         Allergies    Fentanyl, Penicillins, Triamterene, Naproxen, and Ultram [tramadol]    Review of Systems   Review of Systems  Psychiatric/Behavioral:         Seizures    Physical Exam Updated Vital Signs BP 129/81 (BP Location: Right Arm)   Pulse 83   Temp 98 F (36.7 C) (Oral)   Resp 16   Ht 5\' 9"  (1.753 m)   Wt 97.5 kg   SpO2 94%   BMI 31.75 kg/m  Physical Exam Vitals and nursing note reviewed.  Constitutional:      General: She is not in acute distress. HENT:     Head: Normocephalic and atraumatic.     Mouth/Throat:     Mouth: Mucous membranes are moist.     Pharynx: No oropharyngeal exudate or posterior oropharyngeal erythema.  Eyes:     General: No scleral icterus.       Right eye: No discharge.        Left eye: No discharge.     Conjunctiva/sclera: Conjunctivae normal.   Cardiovascular:     Rate and Rhythm: Normal rate and regular rhythm.     Pulses: Normal pulses.     Heart sounds: Normal heart sounds. No murmur heard.    Comments: No pitting edema. +2 radial and pedal pulses. Pulmonary:     Effort: Pulmonary effort is normal. No respiratory distress.     Breath sounds: Normal breath sounds. No wheezing, rhonchi or rales.  Abdominal:     General: Abdomen is flat. Bowel sounds are normal. There is no  distension.     Palpations: Abdomen is soft. There is no mass.  Musculoskeletal:     Right lower leg: No edema.     Left lower leg: No edema.  Skin:    General: Skin is warm and dry.     Findings: No rash.  Neurological:     General: No focal deficit present.     Mental Status: She is alert. Mental status is at baseline.     Comments: GCS 15. Speech is goal oriented. No deficits appreciated to CN III-XII; symmetric eyebrow raise, no facial drooping, tongue midline. Patient has equal grip strength bilaterally with 5/5 strength against resistance in all major muscle groups bilaterally. Patient moves extremities without ataxia. Normal finger-nose-finger.    Psychiatric:        Mood and Affect: Mood normal.     ED Results / Procedures / Treatments   Labs (all labs ordered are listed, but only abnormal results are displayed) Labs Reviewed  COMPREHENSIVE METABOLIC PANEL  CBC WITH DIFFERENTIAL/PLATELET  CBG MONITORING, ED  I-STAT BETA HCG BLOOD, ED ([facility], [facility], [facility] ONLY)    EKG None  Radiology No results found.  Procedures Procedures  {Document cardiac monitor, telemetry assessment procedure when appropriate:1}  Medications Ordered in ED Medications - No data to display  ED Course/ Medical Decision Making/ A&P Clinical Course as of 08/21/22 2345  Tue Aug 21, 2022  2326 Longstanding history of nonepileptic seizures. Multiple extensive medical problems.  [CC]  2343 Platelets(!!): 18 [SM]    Clinical Course User Index [CC] Glyn Ade,  MD [SM] Dorthy Cooler, PA-C   {   Click here for ABCD2, HEART and other calculatorsREFRESH Note before signing :1}                          Medical Decision Making Amount and/or Complexity of Data Reviewed Labs: ordered.   This patient presents to the ED for concern of seizures, this involves an extensive number of treatment options, and is a complaint that carries with it a high risk of complications and morbidity.  The differential diagnosis includes electrolyte abnormalities,    Co morbidities that complicate the patient evaluation     Additional history obtained:  Additional history obtained from *** External records from outside source obtained and reviewed including ***   Lab Tests:  I Ordered, and personally interpreted labs.  The pertinent results include:   -UA: trace leukocytes; rare bacteria; 11-20 squamous epithelial cells -CMP: mild hyponatremia, no acute kidney/liver damage -CBC: PLT (18); no concerns for leukocytosis or anemia -Troponin: initial and repeat within normal limits -hcg: negative -CBG: 339   Imaging Studies ordered:  I ordered imaging studies including ***  I independently visualized and interpreted imaging which showed *** I agree with the radiologist interpretation   Cardiac Monitoring: / EKG:  The patient was maintained on a cardiac monitor.  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 Repeating CBC to reassess PLT. Spoke with Dr. Truett Perna with hematology  who recommends sending another CBC to see if the thrombocytopenia is actually low or just a lab mistake. If low, patient will be admitted and Dr. Truett Perna agrees to follow up on patient tomorrow. Patient has not been on heparin, not actively bleeding from IV site; stable vitals, does not appear ill. I have reviewed the patients home medicines and have made adjustments as needed   Social Determinants of Health:  None           {Document critical care time when appropriate:1} {Document review of labs and clinical decision tools ie heart score, Chads2Vasc2 etc:1}  {Document your independent review of radiology images, and any outside records:1} {Document your discussion with family members, caretakers, and with consultants:1} {Document social determinants of health affecting pt's care:1} {Document your decision making why or why not admission, treatments were needed:1} Final Clinical Impression(s) / ED Diagnoses Final diagnoses:  None    Rx / DC Orders ED Discharge Orders     None

## 2022-08-21 NOTE — ED Notes (Signed)
Pt able to tolerate PO fluids without any difficulty.

## 2022-08-22 LAB — CBC WITH DIFFERENTIAL/PLATELET
Abs Immature Granulocytes: 0.02 10*3/uL (ref 0.00–0.07)
Basophils Absolute: 0.1 10*3/uL (ref 0.0–0.1)
Basophils Relative: 1 %
Eosinophils Absolute: 0.3 10*3/uL (ref 0.0–0.5)
Eosinophils Relative: 4 %
HCT: 39.2 % (ref 36.0–46.0)
Hemoglobin: 13.3 g/dL (ref 12.0–15.0)
Immature Granulocytes: 0 %
Lymphocytes Relative: 42 %
Lymphs Abs: 3 10*3/uL (ref 0.7–4.0)
MCH: 30 pg (ref 26.0–34.0)
MCHC: 33.9 g/dL (ref 30.0–36.0)
MCV: 88.3 fL (ref 80.0–100.0)
Monocytes Absolute: 0.4 10*3/uL (ref 0.1–1.0)
Monocytes Relative: 6 %
Neutro Abs: 3.5 10*3/uL (ref 1.7–7.7)
Neutrophils Relative %: 47 %
Platelets: 211 10*3/uL (ref 150–400)
RBC: 4.44 MIL/uL (ref 3.87–5.11)
RDW: 12.2 % (ref 11.5–15.5)
WBC: 7.2 10*3/uL (ref 4.0–10.5)
nRBC: 0 % (ref 0.0–0.2)

## 2022-08-22 LAB — RAPID URINE DRUG SCREEN, HOSP PERFORMED
Amphetamines: NOT DETECTED
Barbiturates: NOT DETECTED
Benzodiazepines: POSITIVE — AB
Cocaine: NOT DETECTED
Opiates: NOT DETECTED
Tetrahydrocannabinol: NOT DETECTED

## 2022-08-22 NOTE — ED Provider Notes (Signed)
  Physical Exam  BP 114/68 (BP Location: Right Arm)   Pulse 73   Temp 98 F (36.7 C) (Oral)   Resp 16   Ht 5\' 9"  (1.753 m)   Wt 97.5 kg   SpO2 100%   BMI 31.75 kg/m   Physical Exam  Procedures  Procedures  ED Course / MDM   Clinical Course as of 08/22/22 0231  Tue Aug 21, 2022  2326 Longstanding history of nonepileptic seizures. Multiple extensive medical problems.  [CC]  2343 Platelets(!!): 18 [SM]  Wed Aug 22, 2022  0138 Platelets: 211 [SM]    Clinical Course User Index [CC] Glyn Ade, MD [SM] Dorthy Cooler, [place]   Medical Decision Making Amount and/or Complexity of Data Reviewed Labs: ordered. Decision-making details documented in ED Course.   37 year old female presents emergency department with complaints of seizure-like activity.  At shift change, patient care handed off from [facility].  See prior note for more full details.  In short, 37 year old female on Keppra with complaints of reported seizure-like activity and cleared for safe discharge with neurology follow-up by prior provider. Awaiting CBC for assessment of patient's platelets given prior CBC showed significant thrombocytopenia of 18 initially but thought to be laboratory error.  If within normal range, plan is to discharge home with close follow-up with neurology outpatient.  If positive, plan is to admit for significant thrombocytopenia.   Patient's repeat CBC showed platelets of 211; initial insult likely laboratory error.  Will discharge with close neurology outpatient follow-up and recommend patient to continue at home antiepileptic medications.  Treatment plan discussed at length with patient and she acknowledged understanding agree with this approach.  Worrisome signs and symptoms were discussed with the patient with patient understanding to return to emergency department status.  Patient stable upon discharge.        Peter Garter, PA 08/22/22 [id number]    Glyn Ade,  MD 08/22/22 [phone]

## 2022-08-22 NOTE — Discharge Instructions (Addendum)
It was a pleasure caring for you today. Your lab workup was without explanation why your seizures have been increased recently. I recommend following up with your Neurologist within the next few days for evaluation of your medications.   DO NOT swim or drive until your Neurologist clears you.  Seek emergency care if experiencing any new or worsening symptoms.

## 2023-12-31 ENCOUNTER — Other Ambulatory Visit (HOSPITAL_COMMUNITY): Payer: Self-pay

## 2023-12-31 MED ORDER — BUPRENORPHINE HCL-NALOXONE HCL 8-2 MG SL FILM
1.0000 | ORAL_FILM | Freq: Three times a day (TID) | SUBLINGUAL | 0 refills | Status: DC
  Filled 2023-12-31 – 2024-02-28 (×2): qty 90, 30d supply, fill #0

## 2023-12-31 MED ORDER — OXYCODONE-ACETAMINOPHEN 10-325 MG PO TABS
1.0000 | ORAL_TABLET | Freq: Two times a day (BID) | ORAL | 0 refills | Status: AC
  Filled 2023-12-31: qty 60, 30d supply, fill #0

## 2024-01-28 ENCOUNTER — Other Ambulatory Visit: Payer: Self-pay

## 2024-01-31 ENCOUNTER — Other Ambulatory Visit (HOSPITAL_COMMUNITY): Payer: Self-pay

## 2024-01-31 MED ORDER — OXYCODONE-ACETAMINOPHEN 10-325 MG PO TABS
1.0000 | ORAL_TABLET | Freq: Two times a day (BID) | ORAL | 0 refills | Status: AC | PRN
  Filled 2024-01-31: qty 60, 30d supply, fill #0

## 2024-01-31 MED ORDER — BUPRENORPHINE HCL-NALOXONE HCL 8-2 MG SL FILM
1.0000 | ORAL_FILM | Freq: Three times a day (TID) | SUBLINGUAL | 0 refills | Status: AC
  Filled 2024-01-31: qty 90, 30d supply, fill #0

## 2024-02-28 ENCOUNTER — Other Ambulatory Visit (HOSPITAL_COMMUNITY): Payer: Self-pay

## 2024-02-28 MED ORDER — OXYCODONE-ACETAMINOPHEN 10-325 MG PO TABS
1.0000 | ORAL_TABLET | Freq: Two times a day (BID) | ORAL | 0 refills | Status: AC | PRN
  Filled 2024-02-28: qty 60, 30d supply, fill #0

## 2024-02-28 MED ORDER — BUPRENORPHINE HCL-NALOXONE HCL 8-2 MG SL FILM
1.0000 | ORAL_FILM | Freq: Three times a day (TID) | SUBLINGUAL | 0 refills | Status: AC
  Filled 2024-02-28: qty 90, 30d supply, fill #0

## 2024-03-04 ENCOUNTER — Other Ambulatory Visit (HOSPITAL_COMMUNITY): Payer: Self-pay

## 2024-03-18 ENCOUNTER — Other Ambulatory Visit (HOSPITAL_COMMUNITY): Payer: Self-pay

## 2024-03-18 ENCOUNTER — Other Ambulatory Visit: Payer: Self-pay

## 2024-03-18 MED ORDER — ATENOLOL 50 MG PO TABS
50.0000 mg | ORAL_TABLET | Freq: Every day | ORAL | 1 refills | Status: AC
  Filled 2024-03-18 – 2024-03-30 (×2): qty 90, 90d supply, fill #0

## 2024-03-18 MED ORDER — DILTIAZEM HCL 120 MG PO TABS
120.0000 mg | ORAL_TABLET | Freq: Every day | ORAL | 0 refills | Status: AC
  Filled 2024-03-18: qty 90, 90d supply, fill #0

## 2024-03-29 ENCOUNTER — Other Ambulatory Visit (HOSPITAL_COMMUNITY): Payer: Self-pay

## 2024-03-30 ENCOUNTER — Other Ambulatory Visit (HOSPITAL_COMMUNITY): Payer: Self-pay

## 2024-03-30 MED ORDER — OXYCODONE-ACETAMINOPHEN 10-325 MG PO TABS
1.0000 | ORAL_TABLET | Freq: Two times a day (BID) | ORAL | 0 refills | Status: AC | PRN
  Filled 2024-03-30: qty 60, 30d supply, fill #0

## 2024-03-30 MED ORDER — BUPRENORPHINE HCL-NALOXONE HCL 8-2 MG SL FILM
ORAL_FILM | SUBLINGUAL | 0 refills | Status: AC
  Filled 2024-03-30: qty 90, 30d supply, fill #0

## 2024-04-09 ENCOUNTER — Other Ambulatory Visit: Payer: Self-pay

## 2024-04-12 ENCOUNTER — Other Ambulatory Visit: Payer: Self-pay

## 2024-07-30 ENCOUNTER — Ambulatory Visit: Admitting: "Endocrinology
